# Patient Record
Sex: Female | Born: 1940
Health system: Southern US, Community
[De-identification: ages and names within clinical notes are randomized; demographics above are authoritative.]

## PROBLEM LIST (undated history)

## (undated) DIAGNOSIS — D649 Anemia, unspecified: Secondary | ICD-10-CM

## (undated) DIAGNOSIS — I10 Essential (primary) hypertension: Secondary | ICD-10-CM

## (undated) DIAGNOSIS — I2510 Atherosclerotic heart disease of native coronary artery without angina pectoris: Secondary | ICD-10-CM

## (undated) DIAGNOSIS — K862 Cyst of pancreas: Secondary | ICD-10-CM

## (undated) DIAGNOSIS — E7800 Pure hypercholesterolemia, unspecified: Secondary | ICD-10-CM

## (undated) DIAGNOSIS — T7840XA Allergy, unspecified, initial encounter: Secondary | ICD-10-CM

## (undated) DIAGNOSIS — M1990 Unspecified osteoarthritis, unspecified site: Secondary | ICD-10-CM

## (undated) DIAGNOSIS — G4730 Sleep apnea, unspecified: Secondary | ICD-10-CM

## (undated) DIAGNOSIS — E079 Disorder of thyroid, unspecified: Secondary | ICD-10-CM

## (undated) HISTORY — PX: CYSTOCELE REPAIR: SHX163

## (undated) HISTORY — PX: ABDOMINAL HYSTERECTOMY: SHX81

## (undated) HISTORY — DX: Atherosclerotic heart disease of native coronary artery without angina pectoris: I25.10

## (undated) HISTORY — DX: Anemia, unspecified: D64.9

## (undated) HISTORY — DX: Sleep apnea, unspecified: G47.30

## (undated) HISTORY — PX: APPENDECTOMY: SHX54

## (undated) HISTORY — DX: Cyst of pancreas: K86.2

## (undated) HISTORY — DX: Allergy, unspecified, initial encounter: T78.40XA

## (undated) HISTORY — DX: Unspecified osteoarthritis, unspecified site: M19.90

## (undated) HISTORY — PX: BUNIONECTOMY: SHX129

---

## 1991-02-09 HISTORY — PX: BREAST EXCISIONAL BIOPSY: SUR124

## 2017-02-08 DIAGNOSIS — C4492 Squamous cell carcinoma of skin, unspecified: Secondary | ICD-10-CM

## 2017-02-08 HISTORY — DX: Squamous cell carcinoma of skin, unspecified: C44.92

## 2017-10-14 LAB — HM DEXA SCAN

## 2018-06-16 ENCOUNTER — Encounter: Payer: Self-pay | Admitting: Family Medicine

## 2018-08-24 ENCOUNTER — Telehealth: Payer: Self-pay | Admitting: General Practice

## 2018-08-24 NOTE — Telephone Encounter (Signed)
Patient notified to call previous Dr. Gabriel Carina for refills she voices understanding.

## 2018-08-24 NOTE — Telephone Encounter (Signed)
Pt called in office to make NP appt. Pt stated she will need a refill on her Ambien 10mg  for 90 day if possible. Pt stated she will need this by the end of this month . Soonest NP appt was 8/11. Please advise. Pt stated she will be using [facility] in [place] on [address].

## 2018-09-03 ENCOUNTER — Other Ambulatory Visit: Payer: Self-pay

## 2018-09-03 ENCOUNTER — Encounter (HOSPITAL_COMMUNITY): Payer: Self-pay | Admitting: Emergency Medicine

## 2018-09-03 ENCOUNTER — Emergency Department (HOSPITAL_COMMUNITY)
Admission: EM | Admit: 2018-09-03 | Discharge: 2018-09-03 | Disposition: A | Discharge status: Home / Self Care | Payer: Medicare Other | Attending: Emergency Medicine | Admitting: Emergency Medicine

## 2018-09-03 DIAGNOSIS — I10 Essential (primary) hypertension: Secondary | ICD-10-CM | POA: Diagnosis not present

## 2018-09-03 DIAGNOSIS — N8110 Cystocele, unspecified: Secondary | ICD-10-CM

## 2018-09-03 HISTORY — DX: Pure hypercholesterolemia, unspecified: E78.00

## 2018-09-03 HISTORY — DX: Essential (primary) hypertension: I10

## 2018-09-03 HISTORY — DX: Disorder of thyroid, unspecified: E07.9

## 2018-09-03 LAB — URINALYSIS, ROUTINE W REFLEX MICROSCOPIC
Bilirubin Urine: NEGATIVE
Glucose, UA: NEGATIVE mg/dL
Hgb urine dipstick: NEGATIVE
Ketones, ur: NEGATIVE mg/dL
Leukocytes,Ua: NEGATIVE
Nitrite: POSITIVE — AB
Protein, ur: NEGATIVE mg/dL
Specific Gravity, Urine: 1.002 — ABNORMAL LOW (ref 1.005–1.030)
pH: 7 (ref 5.0–8.0)

## 2018-09-03 NOTE — Discharge Instructions (Addendum)
Followup with the urologist provided.  Return here as needed °

## 2018-09-03 NOTE — ED Triage Notes (Signed)
Pt states that she felt something drop in her vaginal area she states that she has to go frequent and when she just sitting she has a burning feeling.

## 2018-09-03 NOTE — ED Provider Notes (Signed)
[facility] EMERGENCY DEPARTMENT Provider Note   CSN: [id number] Arrival date & time: 09/03/18  1138     History   Chief Complaint Chief Complaint  Patient presents with  . prolapsed bladder    HPI Linda Knight is a 78 y.o. female.     HPI Patient presents to the emergency department with prolapse of her bladder.  The patient states that she has had this happen in the past and she had to have a tacking done.  The patient states that she noticed a pressure for quite a while but then noticed worsening symptoms over the last week.  Patient states that nothing seems make the condition better or worse.  The patient states that she did not take any medications prior to arrival for her symptoms.  Patient states she has had a little bit of burning sensation in that region as well.  Patient denies nausea vomiting, fever, weakness, dizziness, headache, blurred vision, diarrhea, incontinence, or syncope. Past Medical History:  Diagnosis Date  . High cholesterol   . Hypertension   . Thyroid disease     There are no active problems to display for this patient.      OB History   No obstetric history on file.      Home Medications    Prior to Admission medications   Not on File    Family History No family history on file.  Social History Social History   Tobacco Use  . Smoking status: Not on file  Substance Use Topics  . Alcohol use: Not on file  . Drug use: Not on file     Allergies   Patient has no allergy information on record.   Review of Systems Review of Systems All other systems negative except as documented in the HPI. All pertinent positives and negatives as reviewed in the HPI.  Physical Exam Updated Vital Signs BP (!) 174/88 (BP Location: Right Arm)   Pulse 85   Temp 98 F (36.7 C) (Oral)   Resp 16   Ht 5\' 5"  (1.651 m)   Wt 62.6 kg   SpO2 100%   BMI 22.96 kg/m   Physical Exam Vitals signs and nursing note reviewed.  Constitutional:       General: She is not in acute distress.    Appearance: She is well-developed.  HENT:     Head: Normocephalic and atraumatic.  Eyes:     Pupils: Pupils are equal, round, and reactive to light.  Neck:     Musculoskeletal: Normal range of motion and neck supple.  Cardiovascular:     Rate and Rhythm: Normal rate and regular rhythm.     Heart sounds: Normal heart sounds. No murmur. No friction rub. No gallop.   Pulmonary:     Effort: Pulmonary effort is normal. No respiratory distress.     Breath sounds: Normal breath sounds. No wheezing.  Abdominal:     General: Bowel sounds are normal. There is no distension.     Palpations: Abdomen is soft.     Tenderness: There is no abdominal tenderness.  Genitourinary:   Skin:    General: Skin is warm and dry.     Capillary Refill: Capillary refill takes less than 2 seconds.     Findings: No erythema or rash.  Neurological:     Mental Status: She is alert and oriented to person, place, and time.     Motor: No abnormal muscle tone.     Coordination: Coordination normal.  Psychiatric:        Behavior: Behavior normal.      ED Treatments / Results  Labs (all labs ordered are listed, but only abnormal results are displayed) Labs Reviewed  URINALYSIS, ROUTINE W REFLEX MICROSCOPIC - Abnormal; Notable for the following components:      Result Value   Color, Urine AMBER (*)    Specific Gravity, Urine 1.002 (*)    Nitrite POSITIVE (*)    Bacteria, UA RARE (*)    All other components within normal limits    EKG None  Radiology No results found.  Procedures Procedures (including critical care time)  Medications Ordered in ED Medications - No data to display   Initial Impression / Assessment and Plan / ED Course  I have reviewed the triage vital signs and the nursing notes.  Pertinent labs & imaging results that were available during my care of the patient were reviewed by me and considered in my medical decision making (see  chart for details).       I spoke with Dr.Wrenn of urology who states that the patient will need to see someone in [place] that handles more of these.  He recommended Dr. Louis Meckel or [place].  I will advised the patient of the follow-up plan.  I advised the patient to return here as needed.  Final Clinical Impressions(s) / ED Diagnoses   Final diagnoses:  None    ED Discharge Orders    None       Dalia Heading, PA-C 09/03/18 1356    Nat Christen, MD 09/04/18 [phone]

## 2018-09-19 ENCOUNTER — Encounter: Payer: Self-pay | Admitting: Family Medicine

## 2018-09-19 ENCOUNTER — Ambulatory Visit (INDEPENDENT_AMBULATORY_CARE_PROVIDER_SITE_OTHER): Payer: Medicare Other | Admitting: Family Medicine

## 2018-09-19 ENCOUNTER — Other Ambulatory Visit: Payer: Self-pay

## 2018-09-19 VITALS — BP 128/76 | HR 62 | Temp 98.3°F | Ht 65.0 in | Wt 135.2 lb

## 2018-09-19 DIAGNOSIS — J309 Allergic rhinitis, unspecified: Secondary | ICD-10-CM

## 2018-09-19 DIAGNOSIS — H7292 Unspecified perforation of tympanic membrane, left ear: Secondary | ICD-10-CM | POA: Diagnosis not present

## 2018-09-19 DIAGNOSIS — G4700 Insomnia, unspecified: Secondary | ICD-10-CM

## 2018-09-19 DIAGNOSIS — E039 Hypothyroidism, unspecified: Secondary | ICD-10-CM

## 2018-09-19 DIAGNOSIS — I6529 Occlusion and stenosis of unspecified carotid artery: Secondary | ICD-10-CM | POA: Diagnosis not present

## 2018-09-19 DIAGNOSIS — H9313 Tinnitus, bilateral: Secondary | ICD-10-CM | POA: Diagnosis not present

## 2018-09-19 DIAGNOSIS — E785 Hyperlipidemia, unspecified: Secondary | ICD-10-CM | POA: Insufficient documentation

## 2018-09-19 DIAGNOSIS — I10 Essential (primary) hypertension: Secondary | ICD-10-CM | POA: Insufficient documentation

## 2018-09-19 DIAGNOSIS — N8110 Cystocele, unspecified: Secondary | ICD-10-CM | POA: Diagnosis not present

## 2018-09-19 DIAGNOSIS — N951 Menopausal and female climacteric states: Secondary | ICD-10-CM | POA: Insufficient documentation

## 2018-09-19 DIAGNOSIS — M8580 Other specified disorders of bone density and structure, unspecified site: Secondary | ICD-10-CM

## 2018-09-19 NOTE — Assessment & Plan Note (Signed)
Recommended calcium and vitamin D supplementation.

## 2018-09-19 NOTE — Assessment & Plan Note (Signed)
Refer to ENT

## 2018-09-19 NOTE — Assessment & Plan Note (Signed)
Continue Lipitor 20 mg daily. 

## 2018-09-19 NOTE — Assessment & Plan Note (Signed)
Due for repeat ultrasound in 6 months total).  Discussed reasons to return to care.  Currently asymptomatic.

## 2018-09-19 NOTE — Assessment & Plan Note (Signed)
-

## 2018-09-19 NOTE — Assessment & Plan Note (Addendum)
Continue management per urology. She is current using premarin gel.

## 2018-09-19 NOTE — Assessment & Plan Note (Signed)
Stable.  Continue Ambien 10 mg at bedtime as needed.

## 2018-09-19 NOTE — Assessment & Plan Note (Signed)
At goal.  Continue amlodipine-benazepril 5-10 once daily and HCTZ 25 mg daily.

## 2018-09-19 NOTE — Progress Notes (Signed)
Chief Complaint:  Linda Knight is a 78 y.o. female who presents today with a chief complaint of tinnitus and to establish care.   Assessment/Plan:  Stenosis of carotid artery Due for repeat ultrasound in 6 months total).  Discussed reasons to return to care.  Currently asymptomatic.  Perforation of left tympanic membrane Refer to ENT.  Tinnitus of both ears Stable.  Refer to ENT.  Female bladder prolapse Continue management per urology. She is current using premarin gel.   Essential hypertension At goal.  Continue amlodipine-benazepril 5-10 once daily and HCTZ 25 mg daily.  Dyslipidemia Continue Lipitor 20 mg daily.  Hypothyroidism Continue levothyroxine 75 mcg daily.  Menopausal symptoms Continue Premarin 0.3 mg 3 times weekly.  Discussed potential side effects of medication including her increased risk of cancer, heart attack, stroke, and blood clots.  Insomnia Stable.  Continue Ambien 10 mg at bedtime as needed.  Allergic rhinitis Continue Allegra 180 mg daily.  Osteopenia Recommended calcium and vitamin D supplementation.   Leg Edema No red flags.  Likely due to increased salt intake.  Discussed lifestyle modifications including regular exercise and salt avoidance.    Subjective:  HPI:  Patient recently moved here from [place] a month ago.  She saw urologist last week was diagnosed with prolapsed bladder.  [facility] saw [facility].  She was started on Premarin cream and told to follow-up in a few months.  Symptoms have not changed significantly since then.  She has a history of hysterectomy in her 64s.  She is also been dealing with tinnitus after suffering a perforated eardrum several months ago.  She saw ENT in [place] who told her that symptoms would gradually improve over time.  She was planning on seeing an audiologist before moving to [place].  She would like to be referred to an ENT specialist.  She has had some bilateral lower leg swelling over  last couple of weeks.  She attributes this mostly to changes in diet and increased salt intake.  Patient also incidentally found to have right carotid artery stenosis about 6 months ago.  Does not currently have any symptoms though was having some dizziness at the time of the finding.  She had a ultrasound performed by cardiologist.  Does not currently have any sort of weakness, numbness, vision changes, or dizziness.  Her stable, chronic medical conditions are outlined below:  # Essential Hypertension - On amlodipine-benazepril 5-10 once daily, and hctz 25mg  daily. Tolerating all well without side effects.  - ROS: No reported chest pain or shortness of breath  # Dyslipidemia - On lipitor 20mg  daily and tolerating well - ROS: No reported myalgias  # Hypothyroidism - On levothyroxine 16mcg daily and tolerating well  # Menopausal Symptoms - On premarin 0.3mg  three times weekly and tolerating well  # Insomnia - On ambien 10mg  at bedtime as needed and tolerating well  # Allergic Rhinitis - On allegra 180mg  daily and tolerating well.   ROS: Per HPI, otherwise a complete review of systems was negative.   PMH:  The following were reviewed and entered/updated in epic: Past Medical History:  Diagnosis Date  . Arthritis   . CAD (coronary artery disease)   . High cholesterol   . Hypertension   . Thyroid disease    Patient Active Problem List   Diagnosis Date Noted  . Tinnitus of both ears 09/19/2018  . Perforation of left tympanic membrane 09/19/2018  . Stenosis of carotid artery 09/19/2018  . Female bladder prolapse 09/19/2018  .  Essential hypertension 09/19/2018  . Dyslipidemia 09/19/2018  . Hypothyroidism 09/19/2018  . Menopausal symptoms 09/19/2018  . Insomnia 09/19/2018  . Allergic rhinitis 09/19/2018  . Osteopenia 09/19/2018   Past Surgical History:  Procedure Laterality Date  . ABDOMINAL HYSTERECTOMY    . APPENDECTOMY    . BREAST BIOPSY    . BUNIONECTOMY       Family History  Problem Relation Age of Onset  . Hyperlipidemia Mother   . Hypertension Mother   . Stroke Mother   . Lung disease Father   . Heart disease Brother   . Colon cancer Neg Hx     Medications- reviewed and updated Current Outpatient Medications  Medication Sig Dispense Refill  . amLODipine-benazepril (LOTREL) 5-10 MG capsule Take 1 capsule by mouth daily.    Marland Kitchen atorvastatin (LIPITOR) 20 MG tablet Take 20 mg by mouth daily.    . bisacodyl (GENTLE LAXATIVE) 5 MG EC tablet Take 5 mg by mouth daily as needed for moderate constipation.    . Cholecalciferol (VITAMIN D3 PO) Take 1,000 Units by mouth.    . Coenzyme Q10 (CO Q 10 PO) Take 200 mg by mouth.    . Cranberry 1000 MG CAPS Take 4,200 mg by mouth.    . estrogens, conjugated, (PREMARIN) 0.3 MG tablet Take 0.3 mg by mouth 3 (three) times a week. Take daily for 21 days then do not take for 7 days.     . fexofenadine (ALLEGRA) 180 MG tablet Take 180 mg by mouth daily.    . hydrochlorothiazide (HYDRODIURIL) 25 MG tablet Take 25 mg by mouth daily.    Marland Kitchen levothyroxine (SYNTHROID) 75 MCG tablet Take 75 mcg by mouth daily before breakfast.    . Probiotic Product (PROBIOTIC-10 PO) Take 20 BAU by mouth.    . zolpidem (AMBIEN) 10 MG tablet Take 10 mg by mouth at bedtime as needed for sleep.     No current facility-administered medications for this visit.     Allergies-reviewed and updated No Known Allergies  Social History   Socioeconomic History  . Marital status: Married    Spouse name: Not on file  . Number of children: Not on file  . Years of education: Not on file  . Highest education level: Not on file  Occupational History  . Not on file  Social Needs  . Financial resource strain: Not on file  . Food insecurity    Worry: Not on file    Inability: Not on file  . Transportation needs    Medical: Not on file    Non-medical: Not on file  Tobacco Use  . Smoking status: Former Smoker    Quit date: 09/18/1980    Years  since quitting: 38.0  . Smokeless tobacco: Never Used  Substance and Sexual Activity  . Alcohol use: Yes  . Drug use: Never  . Sexual activity: Not on file  Lifestyle  . Physical activity    Days per week: Not on file    Minutes per session: Not on file  . Stress: Not on file  Relationships  . Social Herbalist on phone: Not on file    Gets together: Not on file    Attends religious service: Not on file    Active member of club or organization: Not on file    Attends meetings of clubs or organizations: Not on file    Relationship status: Not on file  Other Topics Concern  . Not on file  Social History Narrative  . Not on file         Objective:  Physical Exam: BP 128/76   Pulse 62   Temp 98.3 F (36.8 C)   Ht 5\' 5"  (1.651 m)   Wt 135 lb 4 oz (61.3 kg)   SpO2 99%   BMI 22.51 kg/m   Gen: NAD, resting comfortably HEENT: Left TM with small perforation. Right TM clear.  CV: Regular rate and rhythm with no murmurs appreciated. No carotid bruits.  Pulm: Normal work of breathing, clear to auscultation bilaterally with no crackles, wheezes, or rhonchi GI: Normal bowel sounds present. Soft, Nontender, Nondistended. MSK: No edema, cyanosis, or clubbing noted Skin: Warm, dry Neuro: Grossly normal, moves all extremities Psych: Normal affect and thought content     Linda Knight M. Jerline Pain, MD 09/19/2018 2:11 PM

## 2018-09-19 NOTE — Assessment & Plan Note (Signed)
Continue Allegra 180 mg daily 

## 2018-09-19 NOTE — Assessment & Plan Note (Signed)
Stable  Refer to ENT

## 2018-09-19 NOTE — Patient Instructions (Signed)
It was very nice to see you today!  I will place a referral for you to see ENT.  We will also give an ultrasound of your neck next February.  Come back to see me in 1 year for your next visit, or sooner as needed.  Take care, Dr Jerline Pain  Please try these tips to maintain a healthy lifestyle:   Eat at least 3 REAL meals and 1-2 snacks per day.  Aim for no more than 5 hours between eating.  If you eat breakfast, please do so within one hour of getting up.    Obtain twice as many fruits/vegetables as protein or carbohydrate foods for both lunch and dinner. (Half of each meal should be fruits/vegetables, one quarter protein, and one quarter starchy carbs)   Cut down on sweet beverages. This includes juice, soda, and sweet tea.    Exercise at least 150 minutes every week.

## 2018-09-19 NOTE — Assessment & Plan Note (Signed)
Continue Premarin 0.3 mg 3 times weekly.  Discussed potential side effects of medication including her increased risk of cancer, heart attack, stroke, and blood clots.

## 2018-11-21 ENCOUNTER — Other Ambulatory Visit: Payer: Self-pay | Admitting: Family Medicine

## 2018-11-21 ENCOUNTER — Telehealth: Payer: Self-pay | Admitting: Family Medicine

## 2018-11-21 ENCOUNTER — Other Ambulatory Visit: Payer: Self-pay

## 2018-11-21 ENCOUNTER — Telehealth: Payer: Self-pay | Admitting: *Deleted

## 2018-11-21 DIAGNOSIS — Z1231 Encounter for screening mammogram for malignant neoplasm of breast: Secondary | ICD-10-CM

## 2018-11-21 DIAGNOSIS — G8929 Other chronic pain: Secondary | ICD-10-CM

## 2018-11-21 NOTE — Telephone Encounter (Signed)
Patient called wanting to transfer to the [place] office.  Routing to both providers for approval then patient will be scheduled a TOC appointment.

## 2018-11-21 NOTE — Telephone Encounter (Signed)
See note  Copied from [place] [id number]. Topic: Referral - Request for Referral >> Nov 21, 2018 11:28 AM Lennox Solders wrote: Has patient seen PCP for this complaint? Yes pt has tricare insurance Referral for which specialty:pain management Reason for referral: sciatica pain in lower back

## 2018-11-21 NOTE — Telephone Encounter (Signed)
Please advise 

## 2018-11-21 NOTE — Telephone Encounter (Signed)
Called patient to let her know we are unable to establish her at our office. Stated verbal understanding and appreciated the call back to let her know.

## 2018-11-21 NOTE — Telephone Encounter (Signed)
Ok with me. Please place any necessary orders. 

## 2018-11-21 NOTE — Telephone Encounter (Signed)
Referral has been placed. 

## 2018-11-21 NOTE — Telephone Encounter (Signed)
Looks like patient just established with Dr. Jerline Pain at the [facility] in August (2 months ago). Declining patient transfer at this time

## 2018-11-24 ENCOUNTER — Other Ambulatory Visit: Payer: Self-pay

## 2018-11-24 ENCOUNTER — Ambulatory Visit
Admission: RE | Admit: 2018-11-24 | Discharge: 2018-11-24 | Disposition: A | Discharge status: Home / Self Care | Payer: Medicare Other | Source: Ambulatory Visit | Attending: Family Medicine | Admitting: Family Medicine

## 2018-11-24 DIAGNOSIS — Z1231 Encounter for screening mammogram for malignant neoplasm of breast: Secondary | ICD-10-CM

## 2018-11-27 ENCOUNTER — Other Ambulatory Visit: Payer: Self-pay

## 2018-11-27 ENCOUNTER — Encounter: Payer: Self-pay | Admitting: Family Medicine

## 2018-11-27 ENCOUNTER — Ambulatory Visit (INDEPENDENT_AMBULATORY_CARE_PROVIDER_SITE_OTHER): Payer: Medicare Other | Admitting: Family Medicine

## 2018-11-27 VITALS — BP 110/68 | HR 66 | Temp 97.6°F | Ht 65.0 in | Wt 137.2 lb

## 2018-11-27 DIAGNOSIS — E559 Vitamin D deficiency, unspecified: Secondary | ICD-10-CM

## 2018-11-27 DIAGNOSIS — L570 Actinic keratosis: Secondary | ICD-10-CM

## 2018-11-27 DIAGNOSIS — M8580 Other specified disorders of bone density and structure, unspecified site: Secondary | ICD-10-CM | POA: Diagnosis not present

## 2018-11-27 DIAGNOSIS — G4700 Insomnia, unspecified: Secondary | ICD-10-CM | POA: Diagnosis not present

## 2018-11-27 DIAGNOSIS — I10 Essential (primary) hypertension: Secondary | ICD-10-CM | POA: Diagnosis not present

## 2018-11-27 DIAGNOSIS — E785 Hyperlipidemia, unspecified: Secondary | ICD-10-CM

## 2018-11-27 DIAGNOSIS — M5432 Sciatica, left side: Secondary | ICD-10-CM

## 2018-11-27 DIAGNOSIS — E039 Hypothyroidism, unspecified: Secondary | ICD-10-CM

## 2018-11-27 DIAGNOSIS — M5440 Lumbago with sciatica, unspecified side: Secondary | ICD-10-CM | POA: Insufficient documentation

## 2018-11-27 DIAGNOSIS — M5430 Sciatica, unspecified side: Secondary | ICD-10-CM | POA: Insufficient documentation

## 2018-11-27 LAB — CBC
HCT: 33.7 % — ABNORMAL LOW (ref 36.0–46.0)
Hemoglobin: 11.6 g/dL — ABNORMAL LOW (ref 12.0–15.0)
MCHC: 34.4 g/dL (ref 30.0–36.0)
MCV: 99.4 fl (ref 78.0–100.0)
Platelets: 254 10*3/uL (ref 150.0–400.0)
RBC: 3.39 Mil/uL — ABNORMAL LOW (ref 3.87–5.11)
RDW: 12.9 % (ref 11.5–15.5)
WBC: 4 10*3/uL (ref 4.0–10.5)

## 2018-11-27 LAB — COMPREHENSIVE METABOLIC PANEL
ALT: 16 U/L (ref 0–35)
AST: 22 U/L (ref 0–37)
Albumin: 4.2 g/dL (ref 3.5–5.2)
Alkaline Phosphatase: 72 U/L (ref 39–117)
BUN: 16 mg/dL (ref 6–23)
CO2: 27 mEq/L (ref 19–32)
Calcium: 9 mg/dL (ref 8.4–10.5)
Chloride: 100 mEq/L (ref 96–112)
Creatinine, Ser: 0.92 mg/dL (ref 0.40–1.20)
GFR: 59.22 mL/min — ABNORMAL LOW (ref >60.00)
Glucose, Bld: 104 mg/dL — ABNORMAL HIGH (ref 70–99)
Potassium: 3.4 mEq/L — ABNORMAL LOW (ref 3.5–5.1)
Sodium: 138 mEq/L (ref 135–145)
Total Bilirubin: 0.4 mg/dL (ref 0.2–1.2)
Total Protein: 6.9 g/dL (ref 6.0–8.3)

## 2018-11-27 LAB — LIPID PANEL
Cholesterol: 209 mg/dL — ABNORMAL HIGH (ref 0–200)
HDL: 85.7 mg/dL (ref >39.00)
LDL Cholesterol: 103 mg/dL — ABNORMAL HIGH (ref 0–99)
NonHDL: 123.16
Total CHOL/HDL Ratio: 2
Triglycerides: 103 mg/dL (ref 0.0–149.0)
VLDL: 20.6 mg/dL (ref 0.0–40.0)

## 2018-11-27 LAB — TSH: TSH: 4.14 u[IU]/mL (ref 0.35–4.50)

## 2018-11-27 LAB — VITAMIN D 25 HYDROXY (VIT D DEFICIENCY, FRACTURES): VITD: 32.23 ng/mL (ref 30.00–100.00)

## 2018-11-27 MED ORDER — ZOLPIDEM TARTRATE 10 MG PO TABS: 10.0000 mg | ORAL_TABLET | Freq: Every evening | ORAL | 1 refills | Status: DC | PRN

## 2018-11-27 NOTE — Patient Instructions (Signed)
It was very nice to see you today!  We will check blood work today.  I will place a referral for you to see the pain management specialist and to see the dermatologist.  We froze a spot on your forehead today.  Come back to see me in 6 months, or sooner if needed.  Take care, Dr Jerline Pain  Please try these tips to maintain a healthy lifestyle:   Eat at least 3 REAL meals and 1-2 snacks per day.  Aim for no more than 5 hours between eating.  If you eat breakfast, please do so within one hour of getting up.    Obtain twice as many fruits/vegetables as protein or carbohydrate foods for both lunch and dinner. (Half of each meal should be fruits/vegetables, one quarter protein, and one quarter starchy carbs)   Cut down on sweet beverages. This includes juice, soda, and sweet tea.    Exercise at least 150 minutes every week.

## 2018-11-27 NOTE — Assessment & Plan Note (Signed)
Refilled Ambien 

## 2018-11-27 NOTE — Assessment & Plan Note (Signed)
Check lipid panel.  Continue atorvastatin 10 mg daily.

## 2018-11-27 NOTE — Assessment & Plan Note (Signed)
No red flags.  Please go to pain management per her request.

## 2018-11-27 NOTE — Assessment & Plan Note (Signed)
Patient on 37.5 mcg daily.  Will check TSH level today.

## 2018-11-27 NOTE — Progress Notes (Signed)
Please inform patient of the following:  All of her blood work is STABLE. Do not need to make any changes to her treatment plan at this time. Would like for her to keep up the good work and we can recheck in a year.  Algis Greenhouse. Jerline Pain, MD 11/27/2018 4:04 PM

## 2018-11-27 NOTE — Assessment & Plan Note (Signed)
At goal.  Continue amlodipine-benazepril 5-10 and HCTZ 25 mg daily.  Check CBC, C met, TSH. 

## 2018-11-27 NOTE — Progress Notes (Signed)
Chief Complaint:  Linda Knight is a 78 y.o. female who presents today with a chief complaint of skin lesion.   Assessment/Plan:  Actinic keratosis Cryotherapy performed today.  Tolerated well.  See below procedure note.  Will place for old dermatology.  Sciatica No red flags.  Please go to pain management per her request.  Osteopenia Check vitamin D level.  Continue vitamin D and calcium supplementation.  Insomnia Refilled Ambien.  Hypothyroidism Patient on 37.5 mcg daily.  Will check TSH level today.  Dyslipidemia Check lipid panel.  Continue atorvastatin 10 mg daily.  Essential hypertension At goal.  Continue amlodipine-benazepril 5-10 and HCTZ 25 mg daily.  Check CBC, C met, TSH.     Subjective:  HPI:  Patient has a spot on her right forehead she would like looked at today.  Is been there for several weeks.  She is concerned about possible skin cancer.  She has a history of skin cancer and has had several lesions removed in the past.  No bleeding.  No itching.  No pain.  She is also had worsening left-sided sciatic pain.  She had a cortisone injection about a year ago which help with this.  She would like to be referred to pain management in the area.  She was having some myalgias and fatigue.  She cut her dose of Lipitor to 10 mg and her dose of levothyroxine at 37.5 mcg.  She has been on both these medications for the past 2 weeks.  Her symptoms have improved but she would like blood work today.  Her stable, chronic medical conditions are outlined below:  # Essential Hypertension - On amlodipine-benazepril 5-10 once daily, and hctz 75m daily. Tolerating all well without side effects.  - ROS: No reported chest pain or shortness of breath  # Dyslipidemia - On lipitor 275mdaily and tolerating well - ROS: No reported myalgias  # Menopausal Symptoms - On premarin 0.58m15mhree times weekly and tolerating well  # Insomnia - On ambien 15m46m bedtime as  needed and tolerating well  # Allergic Rhinitis - On allegra 180mg60mly and tolerating well.   ROS: Per HPI  PMH: She reports that she quit smoking about 38 years ago. She has never used smokeless tobacco. She reports current alcohol use. She reports that she does not use drugs.      Objective:  Physical Exam: BP 110/68   Pulse 66   Temp 97.6 F (36.4 C)   Ht '5\' 5"'  (1.651 m)   Wt 137 lb 4 oz (62.3 kg)   SpO2 98%   BMI 22.84 kg/m   Gen: NAD, resting comfortably CV: Regular rate and rhythm with no murmurs appreciated Pulm: Normal work of breathing, clear to auscultation bilaterally with no crackles, wheezes, or rhonchi MSK: No edema, cyanosis, or clubbing noted Skin: Warm, dry.  Hyperkeratotic lesion on right anterior forehead. Neuro: Grossly normal, moves all extremities Psych: Normal affect and thought content  Cryotherapy Procedure Note  Pre-operative Diagnosis: Actinic keratosis  Locations: Right forehead  Indications: Therapeutic  Procedure Details  Patient informed of risks (permanent scarring, infection, light or dark discoloration, bleeding, infection, weakness, numbness and recurrence of the lesion) and benefits of the procedure and verbal informed consent obtained.  The areas are treated with liquid nitrogen therapy, frozen until ice ball extended 3 mm beyond lesion, allowed to thaw, and treated again. The patient tolerated procedure well.  The patient was instructed on post-op care, warned that there may be  blister formation, redness and pain. Recommend OTC analgesia as needed for pain.  Condition: Stable  Complications: none.         Algis Greenhouse. Jerline Pain, MD 11/27/2018 9:24 AM

## 2018-11-27 NOTE — Assessment & Plan Note (Signed)
Check vitamin D level.  Continue vitamin D and calcium supplementation.

## 2018-12-05 ENCOUNTER — Other Ambulatory Visit: Payer: Self-pay | Admitting: Family Medicine

## 2018-12-05 DIAGNOSIS — R928 Other abnormal and inconclusive findings on diagnostic imaging of breast: Secondary | ICD-10-CM

## 2018-12-07 ENCOUNTER — Telehealth: Payer: Self-pay

## 2018-12-07 NOTE — Telephone Encounter (Signed)
Copied from [place] [id number]. Topic: General - Inquiry >> Dec 07, 2018  9:20 AM Mathis Bud wrote: Reason for CRM: Inez Catalina called from [facility] called stating they need more info on the referral for the patient FAX [place] Call back [phone]

## 2018-12-11 ENCOUNTER — Ambulatory Visit
Admission: RE | Admit: 2018-12-11 | Discharge: 2018-12-11 | Disposition: A | Discharge status: Home / Self Care | Payer: Medicare Other | Source: Ambulatory Visit | Attending: Family Medicine | Admitting: Family Medicine

## 2018-12-11 ENCOUNTER — Other Ambulatory Visit: Payer: Self-pay

## 2018-12-11 DIAGNOSIS — R928 Other abnormal and inconclusive findings on diagnostic imaging of breast: Secondary | ICD-10-CM

## 2018-12-15 ENCOUNTER — Telehealth: Payer: Self-pay

## 2018-12-15 NOTE — Telephone Encounter (Signed)
Copied from [place] [id number]. Topic: General - Other >> Dec 15, 2018 10:14 AM Celene Kras A wrote: Reason for CRM: Pt called and is requesting to have her recent lab results mailed to her. Please advise.

## 2018-12-15 NOTE — Telephone Encounter (Signed)
Labs printed and placed in the mail. °

## 2019-01-08 ENCOUNTER — Other Ambulatory Visit: Payer: Self-pay

## 2019-01-08 ENCOUNTER — Other Ambulatory Visit: Payer: Self-pay | Admitting: Pain Medicine

## 2019-01-08 ENCOUNTER — Ambulatory Visit
Admission: RE | Admit: 2019-01-08 | Discharge: 2019-01-08 | Disposition: A | Discharge status: Home / Self Care | Payer: Medicare Other | Source: Ambulatory Visit | Attending: Pain Medicine | Admitting: Pain Medicine

## 2019-01-08 DIAGNOSIS — M25551 Pain in right hip: Secondary | ICD-10-CM

## 2019-01-08 DIAGNOSIS — M25552 Pain in left hip: Secondary | ICD-10-CM

## 2019-01-29 ENCOUNTER — Other Ambulatory Visit: Payer: Self-pay | Admitting: Family Medicine

## 2019-01-29 MED ORDER — AMLODIPINE BESY-BENAZEPRIL HCL 5-10 MG PO CAPS: 1.0000 | ORAL_CAPSULE | Freq: Every day | ORAL | 1 refills | Status: DC

## 2019-01-29 NOTE — Telephone Encounter (Signed)
See note

## 2019-01-29 NOTE — Telephone Encounter (Signed)
Pt can't get the refills of amLODipine-benazepril (LOTREL) 5-10 MG capsule because they don't make it anymore.  Pt needs the medication split out into two pills so that pharmacy can refill, but they state they need new script to do that.  Please send to  [place], [place] Phone:  [phone]  Fax:  [phone]     Pt would like to know when this has been done so she can put in her order.

## 2019-03-13 ENCOUNTER — Telehealth: Payer: Self-pay

## 2019-03-13 ENCOUNTER — Other Ambulatory Visit: Payer: Self-pay

## 2019-03-13 MED ORDER — LEVOTHYROXINE SODIUM 75 MCG PO TABS: 75.0000 ug | ORAL_TABLET | Freq: Every day | ORAL | 0 refills | Status: DC

## 2019-03-13 MED ORDER — HYDROCHLOROTHIAZIDE 25 MG PO TABS: 25.0000 mg | ORAL_TABLET | Freq: Every day | ORAL | 1 refills | Status: DC

## 2019-03-13 NOTE — Telephone Encounter (Signed)
Rx sent 

## 2019-03-13 NOTE — Telephone Encounter (Signed)
MEDICATION:hydrochlorothiazide (HYDRODIURIL) 25 MG tablet and levothyroxine (SYNTHROID) 75 MCG tablet  PHARMACY: [place], [place] Phone:  [phone]  Fax:  [phone]       Comments:   **Let patient know to contact pharmacy at the end of the day to make sure medication is ready. **  ** Please notify patient to allow 48-72 hours to process**  **Encourage patient to contact the pharmacy for refills or they can request refills through [facility]**

## 2019-03-25 ENCOUNTER — Ambulatory Visit: Payer: Medicare Other

## 2019-03-25 ENCOUNTER — Ambulatory Visit: Payer: Medicare Other | Attending: Internal Medicine

## 2019-03-25 DIAGNOSIS — Z23 Encounter for immunization: Secondary | ICD-10-CM

## 2019-03-25 NOTE — Progress Notes (Signed)
   Covid-19 Vaccination Clinic  Name:  Linda Knight    MRN: [id number] DOB: [birth year]  03/25/2019  [name] was observed post Covid-19 immunization for 15 minutes without incidence. She was provided with Vaccine Information Sheet and instruction to access the V-Safe system.   [name] was instructed to call 911 with any severe reactions post vaccine: Marland Kitchen Difficulty breathing  . Swelling of your face and throat  . A fast heartbeat  . A bad rash all over your body  . Dizziness and weakness    Immunizations Administered    Name Date Dose VIS Date Route   Pfizer COVID-19 Vaccine 03/25/2019 12:56 PM 0.3 mL 01/19/2019 Intramuscular   Manufacturer: [place]   Lot: [id number]   [place]: [id number]

## 2019-04-17 ENCOUNTER — Ambulatory Visit: Payer: Medicare Other | Attending: Internal Medicine

## 2019-04-17 DIAGNOSIS — Z23 Encounter for immunization: Secondary | ICD-10-CM

## 2019-04-17 NOTE — Progress Notes (Signed)
   [facility]  Name:  Linda Knight    MRN: [id number] DOB: 18-Aug-1940  04/17/2019  [name] was observed post Covid-19 immunization for 15 minutes without incident. She was provided with Vaccine Information Sheet and instruction to access the V-Safe system.   [name] was instructed to call 911 with any severe reactions post vaccine: Marland Kitchen Difficulty breathing  . Swelling of face and throat  . A fast heartbeat  . A bad rash all over body  . Dizziness and weakness   Immunizations Administered    Name Date Dose VIS Date Route   Pfizer COVID-19 Vaccine 04/17/2019 11:01 AM 0.3 mL 01/19/2019 Intramuscular   Manufacturer: [place]   Lot: [id number]   [place]: [id number]

## 2019-04-18 ENCOUNTER — Ambulatory Visit: Payer: Medicare Other

## 2019-05-03 ENCOUNTER — Ambulatory Visit (INDEPENDENT_AMBULATORY_CARE_PROVIDER_SITE_OTHER): Payer: Medicare Other | Admitting: Family Medicine

## 2019-05-03 ENCOUNTER — Other Ambulatory Visit: Payer: Self-pay

## 2019-05-03 VITALS — BP 145/90 | HR 82 | Temp 97.5°F | Ht 65.0 in | Wt 143.8 lb

## 2019-05-03 DIAGNOSIS — G4700 Insomnia, unspecified: Secondary | ICD-10-CM | POA: Diagnosis not present

## 2019-05-03 DIAGNOSIS — Z79899 Other long term (current) drug therapy: Secondary | ICD-10-CM

## 2019-05-03 DIAGNOSIS — R739 Hyperglycemia, unspecified: Secondary | ICD-10-CM

## 2019-05-03 DIAGNOSIS — I10 Essential (primary) hypertension: Secondary | ICD-10-CM | POA: Diagnosis not present

## 2019-05-03 DIAGNOSIS — H9313 Tinnitus, bilateral: Secondary | ICD-10-CM

## 2019-05-03 DIAGNOSIS — E039 Hypothyroidism, unspecified: Secondary | ICD-10-CM

## 2019-05-03 DIAGNOSIS — R5383 Other fatigue: Secondary | ICD-10-CM | POA: Diagnosis not present

## 2019-05-03 MED ORDER — TEMAZEPAM 7.5 MG PO CAPS: 7.5000 mg | ORAL_CAPSULE | Freq: Every evening | ORAL | 0 refills | Status: DC | PRN

## 2019-05-03 NOTE — Assessment & Plan Note (Signed)
Worsening.  Will start low-dose temazepam as noted above which should hopefully help some with her sleep.

## 2019-05-03 NOTE — Assessment & Plan Note (Signed)
At goal per JNC 8.  Check CBC, c-Met, TSH.  Continue amlodipine-benazepril 5-10 and HCTZ 25 mg daily.

## 2019-05-03 NOTE — Assessment & Plan Note (Signed)
Recently had manufacturer change.  She is currently on levothyroxine 37.5 mcg daily.  Will check TSH today.

## 2019-05-03 NOTE — Assessment & Plan Note (Signed)
Uncontrolled.  Given concurrent tinnitus, may benefit from low-dose benzodiazepine.  Discussed potential side effects however at this point seems the benefits would outweigh risk given the progression of her insomnia and tinnitus.  Will start temazepam 7.5 mg nightly.  She will check with me in a week or so.  Increase dose as needed as tolerated.

## 2019-05-03 NOTE — Patient Instructions (Signed)
It was very nice to see you today!  We will check blood work today.  I think your lack of sleep is causing a lot of your issues.  Please try the Restoril at night to help you with your sleep.  We are starting at a low dose and we can increase the dose as needed.  Please check in with me in a week or so to let me know how it is working for your sleep and ringing in your ear.  Take care, Dr Jerline Pain  Please try these tips to maintain a healthy lifestyle:   Eat at least 3 REAL meals and 1-2 snacks per day.  Aim for no more than 5 hours between eating.  If you eat breakfast, please do so within one hour of getting up.    Each meal should contain half fruits/vegetables, one quarter protein, and one quarter carbs (no bigger than a computer mouse)   Cut down on sweet beverages. This includes juice, soda, and sweet tea.     Drink at least 1 glass of water with each meal and aim for at least 8 glasses per day   Exercise at least 150 minutes every week.

## 2019-05-03 NOTE — Progress Notes (Signed)
   Linda Knight is a 79 y.o. female who presents today for an office visit.  Assessment/Plan:  Chronic Problems Addressed Today: Insomnia Uncontrolled.  Given concurrent tinnitus, may benefit from low-dose benzodiazepine.  Discussed potential side effects however at this point seems the benefits would outweigh risk given the progression of her insomnia and tinnitus.  Will start temazepam 7.5 mg nightly.  She will check with me in a week or so.  Increase dose as needed as tolerated.  Hypothyroidism Recently had manufacturer change.  She is currently on levothyroxine 37.5 mcg daily.  Will check TSH today.  Essential hypertension At goal per JNC 8.  Check CBC, c-Met, TSH.  Continue amlodipine-benazepril 5-10 and HCTZ 25 mg daily.  Tinnitus of both ears Worsening.  Will start low-dose temazepam as noted above which should hopefully help some with her sleep.     Subjective:  HPI:  Patient last seen about 6 months ago.  Since her last visit she has had worsening fatigue and bilateral tinnitus.  She is also had excessive hunger.  She is worried about possible diabetes.  She has had ongoing leg cramps.  She has been trying to take calcium magnesium for that which seems to be helping.  She has had some constipation for which she is taking a stool softener which helps.  Some chills.  No fevers.  She has had some weight gain.  She has had significant difficulty with sleeping.  She is currently taking Ambien nightly which is not particularly effective.  Her bilateral tinnitus makes it very difficult for her to sleep and get a good nights rest.  She is usually seen about 3 to 4 hours per night.       Objective:  Physical Exam: BP (!) 145/90 (BP Location: Right Arm, Patient Position: Sitting, Cuff Size: Normal)   Pulse 82   Temp (!) 97.5 F (36.4 C) (Temporal)   Ht '5\' 5"'$  (1.651 m)   Wt 143 lb 12.8 oz (65.2 kg)   SpO2 98%   BMI 23.93 kg/m   Wt Readings from Last 3 Encounters:  05/03/19 143  lb 12.8 oz (65.2 kg)  11/27/18 137 lb 4 oz (62.3 kg)  09/19/18 135 lb 4 oz (61.3 kg)    Gen: No acute distress, resting comfortably HEENT: Left TM with chronic perforation.  Right TM clear. CV: Regular rate and rhythm with no murmurs appreciated Pulm: Normal work of breathing, clear to auscultation bilaterally with no crackles, wheezes, or rhonchi Neuro: Grossly normal, moves all extremities Psych: Normal affect and thought content      Hillis Mcphatter M. Jerline Pain, MD 05/03/2019 3:45 PM

## 2019-05-04 LAB — COMPREHENSIVE METABOLIC PANEL
ALT: 22 U/L (ref 0–35)
AST: 27 U/L (ref 0–37)
Albumin: 4.6 g/dL (ref 3.5–5.2)
Alkaline Phosphatase: 65 U/L (ref 39–117)
BUN: 16 mg/dL (ref 6–23)
CO2: 28 mEq/L (ref 19–32)
Calcium: 9.5 mg/dL (ref 8.4–10.5)
Chloride: 94 mEq/L — ABNORMAL LOW (ref 96–112)
Creatinine, Ser: 0.99 mg/dL (ref 0.40–1.20)
GFR: 54.35 mL/min — ABNORMAL LOW (ref >60.00)
Glucose, Bld: 106 mg/dL — ABNORMAL HIGH (ref 70–99)
Potassium: 3.4 mEq/L — ABNORMAL LOW (ref 3.5–5.1)
Sodium: 133 mEq/L — ABNORMAL LOW (ref 135–145)
Total Bilirubin: 0.4 mg/dL (ref 0.2–1.2)
Total Protein: 7.3 g/dL (ref 6.0–8.3)

## 2019-05-04 LAB — TSH: TSH: 2.14 u[IU]/mL (ref 0.35–4.50)

## 2019-05-04 LAB — CBC
HCT: 32.4 % — ABNORMAL LOW (ref 36.0–46.0)
Hemoglobin: 11.2 g/dL — ABNORMAL LOW (ref 12.0–15.0)
MCHC: 34.6 g/dL (ref 30.0–36.0)
MCV: 99.3 fl (ref 78.0–100.0)
Platelets: 290 10*3/uL (ref 150.0–400.0)
RBC: 3.26 Mil/uL — ABNORMAL LOW (ref 3.87–5.11)
RDW: 12.6 % (ref 11.5–15.5)
WBC: 6.9 10*3/uL (ref 4.0–10.5)

## 2019-05-04 LAB — HEMOGLOBIN A1C: Hgb A1c MFr Bld: 5.2 % (ref 4.6–6.5)

## 2019-05-04 LAB — VITAMIN B12: Vitamin B-12: 238 pg/mL (ref 211–911)

## 2019-05-04 NOTE — Progress Notes (Signed)
Please inform patient of the following:  Her B12 is low and she is slightly anemic but all of her other blood work is stable. Recommend we start her on the b12 protocol here. Would like for her to let us know if her symptoms are not improving over the next couple of weeks.  Algis Greenhouse. Jerline Pain, MD 05/04/2019 1:09 PM

## 2019-05-07 ENCOUNTER — Other Ambulatory Visit: Payer: Self-pay

## 2019-05-07 ENCOUNTER — Encounter: Payer: Self-pay | Admitting: Family Medicine

## 2019-05-07 MED ORDER — ESTROGENS, CONJUGATED 0.625 MG/GM VA CREA: TOPICAL_CREAM | VAGINAL | 2 refills | Status: DC

## 2019-05-07 NOTE — Telephone Encounter (Signed)
Patient notified per Junious Dresser. Rx sent patient notified

## 2019-05-08 ENCOUNTER — Other Ambulatory Visit: Payer: Self-pay

## 2019-05-08 ENCOUNTER — Ambulatory Visit (INDEPENDENT_AMBULATORY_CARE_PROVIDER_SITE_OTHER): Payer: Medicare Other

## 2019-05-08 DIAGNOSIS — E538 Deficiency of other specified B group vitamins: Secondary | ICD-10-CM

## 2019-05-08 MED ORDER — CYANOCOBALAMIN 1000 MCG/ML IJ SOLN
1000.0000 ug | Freq: Once | INTRAMUSCULAR | Status: AC
  Administered 2019-05-08: 1000 ug via INTRAMUSCULAR

## 2019-05-08 MED ORDER — CYANOCOBALAMIN 1000 MCG/ML IJ SOLN: 1000.0000 ug | Freq: Once | INTRAMUSCULAR | Status: DC

## 2019-05-08 NOTE — Progress Notes (Signed)
I have reviewed the patient's encounter and agree with the documentation.  Algis Greenhouse. Jerline Pain, MD 05/08/2019 10:38 AM

## 2019-05-08 NOTE — Progress Notes (Signed)
Patient came in today for Vitamin B12 injection. Patient tolerated her injection well. No questions or concerns at this time.

## 2019-05-10 ENCOUNTER — Telehealth: Payer: Self-pay | Admitting: Family Medicine

## 2019-05-10 NOTE — Telephone Encounter (Signed)
I left a message asking the patient to call and schedule Medicare AWV with [name], possibly combined with one of her other appointments.  If patient calls back, please schedule Medicare Wellness Visit (initial) at next available opening. VDM ([place])

## 2019-05-15 ENCOUNTER — Ambulatory Visit (INDEPENDENT_AMBULATORY_CARE_PROVIDER_SITE_OTHER): Payer: Medicare Other

## 2019-05-15 ENCOUNTER — Other Ambulatory Visit: Payer: Self-pay

## 2019-05-15 DIAGNOSIS — E538 Deficiency of other specified B group vitamins: Secondary | ICD-10-CM | POA: Diagnosis not present

## 2019-05-15 MED ORDER — CYANOCOBALAMIN 1000 MCG/ML IJ SOLN
1000.0000 ug | Freq: Once | INTRAMUSCULAR | Status: AC
  Administered 2019-05-15: 1000 ug via INTRAMUSCULAR

## 2019-05-15 NOTE — Progress Notes (Signed)
Per orders of Dr. Jerline Pain, injection of Vitamin B12 given by Tobe Sos in right deltoid. Patient tolerated injection well. Patient will make appointment for 1 week.

## 2019-05-22 ENCOUNTER — Ambulatory Visit (INDEPENDENT_AMBULATORY_CARE_PROVIDER_SITE_OTHER): Payer: Medicare Other

## 2019-05-22 ENCOUNTER — Encounter: Payer: Self-pay | Admitting: Family Medicine

## 2019-05-22 ENCOUNTER — Other Ambulatory Visit: Payer: Self-pay

## 2019-05-22 VITALS — BP 124/70 | HR 94 | Temp 97.1°F | Ht 65.0 in | Wt 143.6 lb

## 2019-05-22 DIAGNOSIS — Z780 Asymptomatic menopausal state: Secondary | ICD-10-CM | POA: Diagnosis not present

## 2019-05-22 DIAGNOSIS — E538 Deficiency of other specified B group vitamins: Secondary | ICD-10-CM

## 2019-05-22 DIAGNOSIS — M8580 Other specified disorders of bone density and structure, unspecified site: Secondary | ICD-10-CM | POA: Diagnosis not present

## 2019-05-22 DIAGNOSIS — Z0000 Encounter for general adult medical examination without abnormal findings: Secondary | ICD-10-CM

## 2019-05-22 MED ORDER — CYANOCOBALAMIN 1000 MCG/ML IJ SOLN
1000.0000 ug | Freq: Once | INTRAMUSCULAR | Status: AC
  Administered 2019-05-22: 1000 ug via INTRAMUSCULAR

## 2019-05-22 NOTE — Patient Instructions (Signed)
[name] , Thank you for taking time to come for your Medicare Wellness Visit. I appreciate your ongoing commitment to your health goals. Please review the following plan we discussed and let me know if I can assist you in the future.   Screening recommendations/referrals: Colorectal Screening: No longer indicated  Mammogram: up to date; last 12/11/18 Bone Density: recommended every 2-5 years   Vision and Dental Exams: Recommended annual ophthalmology exams for early detection of glaucoma and other disorders of the eye Recommended annual dental exams for proper oral hygiene  Vaccinations: Influenza vaccine: completed 11/11/18 Pneumococcal vaccine: recommended both Prevnar and Pneumovaz  Tdap vaccine: recommended every 10 years; Please call your insurance company to determine your out of pocket expense. You also receive this vaccine at your local pharmacy or Health Dept. Shingles vaccine:. You may receive this vaccine at your local pharmacy. (see handout)   Advanced directives: Please bring a copy of your POA (Power of Attorney) and/or Living Will to your next appointment.  Goals: Recommend to drink at least 6-8 8oz glasses of water per day and consume a balanced diet rich in fresh fruits and vegetables.   Next appointment: Please schedule your Annual Wellness Visit with your Nurse Health Advisor in one year.  Preventive Care 40-64 Years, Female Preventive care refers to lifestyle choices and visits with your health care provider that can promote health and wellness. What does preventive care include?  A yearly physical exam. This is also called an annual well check.  Dental exams once or twice a year.  Routine eye exams. Ask your health care provider how often you should have your eyes checked.  Personal lifestyle choices, including:  Daily care of your teeth and gums.  Regular physical activity.  Eating a healthy diet.  Avoiding tobacco and drug use.  Limiting alcohol  use.  Practicing safe sex.  Taking low-dose aspirin daily starting at age 40 if recommended by your health care provider.  Taking vitamin and mineral supplements as recommended by your health care provider. What happens during an annual well check? The services and screenings done by your health care provider during your annual well check will depend on your age, overall health, lifestyle risk factors, and family history of disease. Counseling  Your health care provider may ask you questions about your:  Alcohol use.  Tobacco use.  Drug use.  Emotional well-being.  Home and relationship well-being.  Sexual activity.  Eating habits.  Work and work Statistician.  Method of birth control.  Menstrual cycle.  Pregnancy history. Screening  You may have the following tests or measurements:  Height, weight, and BMI.  Blood pressure.  Lipid and cholesterol levels. These may be checked every 5 years, or more frequently if you are over 63 years old.  Skin check.  Lung cancer screening. You may have this screening every year starting at age 89 if you have a 30-pack-year history of smoking and currently smoke or have quit within the past 15 years.  Fecal occult blood test (FOBT) of the stool. You may have this test every year starting at age 44.  Flexible sigmoidoscopy or colonoscopy. You may have a sigmoidoscopy every 5 years or a colonoscopy every 10 years starting at age 38.  Hepatitis C blood test.  Hepatitis B blood test.  Sexually transmitted disease (STD) testing.  Diabetes screening. This is done by checking your blood sugar (glucose) after you have not eaten for a while (fasting). You may have this done every  1-3 years.  Mammogram. This may be done every 1-2 years. Talk to your health care provider about when you should start having regular mammograms. This may depend on whether you have a family history of breast cancer.  BRCA-related cancer screening. This may  be done if you have a family history of breast, ovarian, tubal, or peritoneal cancers.  Pelvic exam and Pap test. This may be done every 3 years starting at age 53. Starting at age 53, this may be done every 5 years if you have a Pap test in combination with an HPV test.  Bone density scan. This is done to screen for osteoporosis. You may have this scan if you are at high risk for osteoporosis. Discuss your test results, treatment options, and if necessary, the need for more tests with your health care provider. Vaccines  Your health care provider may recommend certain vaccines, such as:  Influenza vaccine. This is recommended every year.  Tetanus, diphtheria, and acellular pertussis (Tdap, Td) vaccine. You may need a Td booster every 10 years.  Zoster vaccine. You may need this after age 39.  Pneumococcal 13-valent conjugate (PCV13) vaccine. You may need this if you have certain conditions and were not previously vaccinated.  Pneumococcal polysaccharide (PPSV23) vaccine. You may need one or two doses if you smoke cigarettes or if you have certain conditions. Talk to your health care provider about which screenings and vaccines you need and how often you need them. This information is not intended to replace advice given to you by your health care provider. Make sure you discuss any questions you have with your health care provider. Document Released: 02/21/2015 Document Revised: 10/15/2015 Document Reviewed: 11/26/2014 Elsevier Interactive Patient Education  2017 [place] Prevention in the Home Falls can cause injuries. They can happen to people of all ages. There are many things you can do to make your home safe and to help prevent falls. What can I do on the outside of my home?  Regularly fix the edges of walkways and driveways and fix any cracks.  Remove anything that might make you trip as you walk through a door, such as a raised step or threshold.  Trim any  bushes or trees on the path to your home.  Use bright outdoor lighting.  Clear any walking paths of anything that might make someone trip, such as rocks or tools.  Regularly check to see if handrails are loose or broken. Make sure that both sides of any steps have handrails.  Any raised decks and porches should have guardrails on the edges.  Have any leaves, snow, or ice cleared regularly.  Use sand or salt on walking paths during winter.  Clean up any spills in your garage right away. This includes oil or grease spills. What can I do in the bathroom?  Use night lights.  Install grab bars by the toilet and in the tub and shower. Do not use towel bars as grab bars.  Use non-skid mats or decals in the tub or shower.  If you need to sit down in the shower, use a plastic, non-slip stool.  Keep the floor dry. Clean up any water that spills on the floor as soon as it happens.  Remove soap buildup in the tub or shower regularly.  Attach bath mats securely with double-sided non-slip rug tape.  Do not have throw rugs and other things on the floor that can make you trip. What can I do  in the bedroom?  Use night lights.  Make sure that you have a light by your bed that is easy to reach.  Do not use any sheets or blankets that are too big for your bed. They should not hang down onto the floor.  Have a firm chair that has side arms. You can use this for support while you get dressed.  Do not have throw rugs and other things on the floor that can make you trip. What can I do in the kitchen?  Clean up any spills right away.  Avoid walking on wet floors.  Keep items that you use a lot in easy-to-reach places.  If you need to reach something above you, use a strong step stool that has a grab bar.  Keep electrical cords out of the way.  Do not use floor polish or wax that makes floors slippery. If you must use wax, use non-skid floor wax.  Do not have throw rugs and other  things on the floor that can make you trip. What can I do with my stairs?  Do not leave any items on the stairs.  Make sure that there are handrails on both sides of the stairs and use them. Fix handrails that are broken or loose. Make sure that handrails are as long as the stairways.  Check any carpeting to make sure that it is firmly attached to the stairs. Fix any carpet that is loose or worn.  Avoid having throw rugs at the top or bottom of the stairs. If you do have throw rugs, attach them to the floor with carpet tape.  Make sure that you have a light switch at the top of the stairs and the bottom of the stairs. If you do not have them, ask someone to add them for you. What else can I do to help prevent falls?  Wear shoes that:  Do not have high heels.  Have rubber bottoms.  Are comfortable and fit you well.  Are closed at the toe. Do not wear sandals.  If you use a stepladder:  Make sure that it is fully opened. Do not climb a closed stepladder.  Make sure that both sides of the stepladder are locked into place.  Ask someone to hold it for you, if possible.  Clearly mark and make sure that you can see:  Any grab bars or handrails.  First and last steps.  Where the edge of each step is.  Use tools that help you move around (mobility aids) if they are needed. These include:  Canes.  Walkers.  Scooters.  Crutches.  Turn on the lights when you go into a dark area. Replace any light bulbs as soon as they burn out.  Set up your furniture so you have a clear path. Avoid moving your furniture around.  If any of your floors are uneven, fix them.  If there are any pets around you, be aware of where they are.  Review your medicines with your doctor. Some medicines can make you feel dizzy. This can increase your chance of falling. Ask your doctor what other things that you can do to help prevent falls. This information is not intended to replace advice given to  you by your health care provider. Make sure you discuss any questions you have with your health care provider. Document Released: 11/21/2008 Document Revised: 07/03/2015 Document Reviewed: 03/01/2014 Elsevier Interactive Patient Education  2017 [employer].

## 2019-05-22 NOTE — Progress Notes (Signed)
Subjective:   Linda Knight is a 79 y.o. female who presents for Medicare Annual (Subsequent) preventive examination.  Review of Systems:   Cardiac Risk Factors include: advanced age (>36men, >78 women);hypertension;dyslipidemia    Objective:     Vitals: BP 124/70   Pulse 94   Temp (!) 97.1 F (36.2 C) (Temporal)   Ht 5\' 5"  (1.651 m)   Wt 143 lb 9.6 oz (65.1 kg)   SpO2 98%   BMI 23.90 kg/m   Body mass index is 23.9 kg/m.  Advanced Directives 05/22/2019 09/03/2018  Does Patient Have a Medical Advance Directive? Yes No  Type of Advance Directive Living will;Healthcare Power of Attorney -  Does patient want to make changes to medical advance directive? No - Patient declined -  Copy of [place] in Chart? No - copy requested -  Would patient like information on creating a medical advance directive? - No - Patient declined    Tobacco Social History   Tobacco Use  Smoking Status Former Smoker  . Quit date: 09/18/1980  . Years since quitting: 38.6  Smokeless Tobacco Never Used     Counseling given: Not Answered   Clinical Intake:  Pre-visit preparation completed: Yes  Pain : 0-10 Pain Score: 5  Pain Type: Chronic pain Pain Location: Ear Pain Orientation: Right, Left Pain Descriptors / Indicators: Throbbing, Nagging Pain Onset: More than a month ago Pain Frequency: Constant  Diabetes: No  How often do you need to have someone help you when you read instructions, pamphlets, or other written materials from your doctor or pharmacy?: 1 - Never  Interpreter Needed?: No  Information entered by :: Denman George LPN  Past Medical History:  Diagnosis Date  . Arthritis   . CAD (coronary artery disease)   . High cholesterol   . Hypertension   . Thyroid disease    Past Surgical History:  Procedure Laterality Date  . ABDOMINAL HYSTERECTOMY    . APPENDECTOMY    . BREAST EXCISIONAL BIOPSY Right   . BUNIONECTOMY     Family History    Problem Relation Age of Onset  . Hyperlipidemia Mother   . Hypertension Mother   . Stroke Mother   . Lung disease Father   . Heart disease Brother   . Colon cancer Neg Hx    Social History   Socioeconomic History  . Marital status: Married    Spouse name: Not on file  . Number of children: Not on file  . Years of education: Not on file  . Highest education level: Not on file  Occupational History  . Occupation: Retired   Tobacco Use  . Smoking status: Former Smoker    Quit date: 09/18/1980    Years since quitting: 38.6  . Smokeless tobacco: Never Used  Substance and Sexual Activity  . Alcohol use: Yes  . Drug use: Never  . Sexual activity: Not on file  Other Topics Concern  . Not on file  Social History Narrative   Moved from [facility] July 2020   Social Determinants of Health   Financial Resource Strain:   . Difficulty of Paying Living Expenses:   Food Insecurity:   . Worried About Charity fundraiser in the Last Year:   . Arboriculturist in the Last Year:   Transportation Needs:   . Film/video editor (Medical):   Marland Kitchen Lack of Transportation (Non-Medical):   Physical Activity:   . Days of Exercise per  Week:   . Minutes of Exercise per Session:   Stress:   . Feeling of Stress :   Social Connections:   . Frequency of Communication with Friends and Family:   . Frequency of Social Gatherings with Friends and Family:   . Attends Religious Services:   . Active Member of Clubs or Organizations:   . Attends Archivist Meetings:   Marland Kitchen Marital Status:     Outpatient Encounter Medications as of 05/22/2019  Medication Sig  . amLODipine-benazepril (LOTREL) 5-10 MG capsule Take 1 capsule by mouth daily.  Marland Kitchen atorvastatin (LIPITOR) 20 MG tablet Take 10 mg by mouth daily.   . bisacodyl (GENTLE LAXATIVE) 5 MG EC tablet Take 5 mg by mouth daily as needed for moderate constipation.  . Cholecalciferol (VITAMIN D3 PO) Take 1,000 Units by mouth.  . Coenzyme Q10 (CO Q  10 PO) Take 200 mg by mouth.  . conjugated estrogens (PREMARIN) vaginal cream 0.5 g intravaginally twice per week then taper to maintenance dose.  . Cranberry 1000 MG CAPS Take 4,200 mg by mouth.  . estrogens, conjugated, (PREMARIN) 0.3 MG tablet Take 0.3 mg by mouth 3 (three) times a week. Take daily for 21 days then do not take for 7 days.   . hydrochlorothiazide (HYDRODIURIL) 25 MG tablet Take 1 tablet (25 mg total) by mouth daily. Take 25 mg by mouth daily.  Marland Kitchen levothyroxine (SYNTHROID) 75 MCG tablet Take 1 tablet (75 mcg total) by mouth daily before breakfast. Patient taking 0.5 tablet daily  . Probiotic Product (PROBIOTIC-10 PO) Take 20 BAU by mouth.  . zolpidem (AMBIEN) 10 MG tablet Take 10 mg by mouth at bedtime as needed for sleep.  . temazepam (RESTORIL) 7.5 MG capsule Take 1 capsule (7.5 mg total) by mouth at bedtime as needed for sleep. (Patient not taking: Reported on 05/22/2019)  . [EXPIRED] cyanocobalamin ((VITAMIN B-12)) injection 1,000 mcg    No facility-administered encounter medications on file as of 05/22/2019.    Activities of Daily Living In your present state of health, do you have any difficulty performing the following activities: 05/22/2019  Hearing? Y  Vision? N  Difficulty concentrating or making decisions? N  Walking or climbing stairs? N  Dressing or bathing? N  Doing errands, shopping? N  Preparing Food and eating ? N  Using the Toilet? N  In the past six months, have you accidently leaked urine? N  Do you have problems with loss of bowel control? N  Managing your Medications? N  Managing your Finances? N  Housekeeping or managing your Housekeeping? N    Patient Care Team: Vivi Barrack, MD as PCP - General (Family Medicine) Dian Situ, MD as Consulting Physician (Pain Medicine)    Assessment:   This is a routine wellness examination for Linda Knight.  Exercise Activities and Dietary recommendations Current Exercise Habits: The patient does not  participate in regular exercise at present  Goals   None     Fall Risk Fall Risk  05/22/2019 11/27/2018 09/19/2018  Falls in the past year? 0 0 0  Number falls in past yr: 0 - -  Injury with Fall? 0 - -  Follow up Falls evaluation completed;Education provided;Falls prevention discussed - -   Is the patient's home free of loose throw rugs in walkways, pet beds, electrical cords, etc?   yes      Grab bars in the bathroom? yes      Handrails on the stairs?   yes  Adequate lighting?   yes  Timed Get Up and Go performed: completed and within normal timeframe; no gait abnormalities noted   Depression Screen PHQ 2/9 Scores 05/22/2019 11/27/2018 09/19/2018  PHQ - 2 Score 0 0 0  PHQ- 9 Score - 2 -     Cognitive Function     6CIT Screen 05/22/2019  What Year? 0 points  What month? 0 points  What time? 0 points  Count back from 20 0 points  Months in reverse 0 points  Repeat phrase 0 points  Total Score 0    Immunization History  Administered Date(s) Administered  . Influenza, High Dose Seasonal PF 11/11/2018  . PFIZER SARS-COV-2 Vaccination 03/25/2019, 04/17/2019    Qualifies for Shingles Vaccine? Patient has completed 1st Shingrix and is aware that 2nd is needed   Screening Tests Health Maintenance  Topic Date Due  . DEXA SCAN  Never done  . PNA vac Low Risk Adult (2 of 2 - PPSV23) 11/24/2018  . TETANUS/TDAP  11/27/2019 (Originally 01/26/1960)  . INFLUENZA VACCINE  09/09/2019    Cancer Screenings: Lung: Low Dose CT Chest recommended if Age 62-80 years, 30 pack-year currently smoking OR have quit w/in 15years. Patient does not qualify. Breast:  Up to date on Mammogram? Yes   Up to date of Bone Density/Dexa? No; ordered to be done with next mammogram  Colorectal: No longer indicated     Plan:  I have personally reviewed and addressed the Medicare Annual Wellness questionnaire and have noted the following in the patient's chart:  A. Medical and social  history B. Use of alcohol, tobacco or illicit drugs  C. Current medications and supplements D. Functional ability and status E.  Nutritional status F.  Physical activity G. Advance directives H. List of other physicians I.  Hospitalizations, surgeries, and ER visits in previous 12 months J.  [place] such as hearing and vision if needed, cognitive and depression L. Referrals, records requested, and appointments- dexa ordered   In addition, I have reviewed and discussed with patient certain preventive protocols, quality metrics, and best practice recommendations. A written personalized care plan for preventive services as well as general preventive health recommendations were provided to patient.   Signed,  Denman George, LPN  Nurse Health Advisor   Nurse Notes: B12 injection administered as ordered.   No voiced complaints.  Patient scheduled for return for next injection.   Patient also with continued complaints of tinnitus.  She did see ENT (requesting records).  She is asking if there are any medications that can be prescribed to provide relief.    She also tried Temazepam and states that it was ineffective.  She took 7.5mg  on one night and 15mg  on another.  She states that she previously had and rx for Zolpidem 10mg  from her previous provider and is asking if she can have a refill of this.  Please advise.

## 2019-05-23 ENCOUNTER — Telehealth: Payer: Self-pay

## 2019-05-23 NOTE — Telephone Encounter (Signed)
Patient also with continued complaints of tinnitus.  She did see ENT (requesting records).  She is asking if there are any medications that can be prescribed to provide relief.    She also tried Temazepam and states that it was ineffective.  She took 7.5mg  on one night and 15mg  on another.  She states that she previously had and rx for Zolpidem 10mg  from her previous provider and is asking if she can have a refill of this.  Please advise.

## 2019-05-29 ENCOUNTER — Ambulatory Visit: Payer: Medicare Other

## 2019-05-29 ENCOUNTER — Other Ambulatory Visit: Payer: Self-pay

## 2019-05-29 ENCOUNTER — Ambulatory Visit (INDEPENDENT_AMBULATORY_CARE_PROVIDER_SITE_OTHER): Payer: Medicare Other | Admitting: Family Medicine

## 2019-05-29 VITALS — BP 144/80 | HR 75 | Temp 96.9°F | Ht 65.0 in | Wt 143.2 lb

## 2019-05-29 DIAGNOSIS — I10 Essential (primary) hypertension: Secondary | ICD-10-CM

## 2019-05-29 DIAGNOSIS — E538 Deficiency of other specified B group vitamins: Secondary | ICD-10-CM | POA: Diagnosis not present

## 2019-05-29 DIAGNOSIS — G4700 Insomnia, unspecified: Secondary | ICD-10-CM | POA: Diagnosis not present

## 2019-05-29 MED ORDER — CYANOCOBALAMIN 1000 MCG/ML IJ SOLN
1000.0000 ug | Freq: Once | INTRAMUSCULAR | Status: AC
  Administered 2019-05-29: 1000 ug via INTRAMUSCULAR

## 2019-05-29 MED ORDER — ZOLPIDEM TARTRATE ER 12.5 MG PO TBCR: 12.5000 mg | EXTENDED_RELEASE_TABLET | Freq: Every evening | ORAL | 0 refills | Status: DC | PRN

## 2019-05-29 NOTE — Patient Instructions (Signed)
It was very nice to see you today!  We will change your Ambien to sustained-release 12.5 mg tablet.  Please check in with me in a couple weeks let me know how this is working for you.  We may potentially get you back on Celexa depending on your response to Ambien.  If things are going well, we will see you back in 6 months.  Please come back to see me sooner if needed.  Take care, Dr Jerline Pain  Please try these tips to maintain a healthy lifestyle:   Eat at least 3 REAL meals and 1-2 snacks per day.  Aim for no more than 5 hours between eating.  If you eat breakfast, please do so within one hour of getting up.    Each meal should contain half fruits/vegetables, one quarter protein, and one quarter carbs (no bigger than a computer mouse)   Cut down on sweet beverages. This includes juice, soda, and sweet tea.     Drink at least 1 glass of water with each meal and aim for at least 8 glasses per day   Exercise at least 150 minutes every week.

## 2019-05-29 NOTE — Assessment & Plan Note (Signed)
B12 injection given today.  Will need repeat B12 level in 3 to 6 months.

## 2019-05-29 NOTE — Addendum Note (Signed)
Addended by: Betti Cruz on: 05/29/2019 10:00 AM   Modules accepted: Orders

## 2019-05-29 NOTE — Assessment & Plan Note (Signed)
Discussed treatment options.  Advised patient did not take Ambien and Restoril at the same time.  We will switch to controlled release Ambien 12.5 mg nightly.  She will check with me in a couple of weeks.  We may start her back on Celexa depending on response to Ambien.  If continues to have issues with sleep, would consider switching to other benzo such as Valium given her concurrent tinnitus.

## 2019-05-29 NOTE — Assessment & Plan Note (Signed)
At goal per JNC 8.  Continue amlodipine-benazepril 5-10 and HCTZ 25 mg daily.

## 2019-05-29 NOTE — Progress Notes (Signed)
   Linda Knight is a 79 y.o. female who presents today for an office visit.  Assessment/Plan:  Chronic Problems Addressed Today: B12 deficiency B12 injection given today.  Will need repeat B12 level in 3 to 6 months.  Insomnia Discussed treatment options.  Advised patient did not take Ambien and Restoril at the same time.  We will switch to controlled release Ambien 12.5 mg nightly.  She will check with me in a couple of weeks.  We may start her back on Celexa depending on response to Ambien.  If continues to have issues with sleep, would consider switching to other benzo such as Valium given her concurrent tinnitus.  Essential hypertension At goal per JNC 8.  Continue amlodipine-benazepril 5-10 and HCTZ 25 mg daily.     Subjective:  HPI:  Patient here for follow-up.  Last seen about a month ago.  We started her on Restoril to help with sleep and concurrent tenderness.  Unfortunately does not work well for her.  She went back to Ambien which is worked better.  She has been taking the Restoril and Ambien on the same night.  She states that this works well.  No obvious somnolence or respiratory depression.  She was also started on B12 protocol.  She has noticed improvement in her daytime energy levels after getting replacement shots.       Objective:  Physical Exam: BP (!) 144/80   Pulse 75   Temp (!) 96.9 F (36.1 C)   Ht 5\' 5"  (1.651 m)   Wt 143 lb 3.2 oz (65 kg)   SpO2 99%   BMI 23.83 kg/m   Gen: No acute distress, resting comfortably CV: Regular rate and rhythm with no murmurs appreciated Pulm: Normal work of breathing, clear to auscultation bilaterally with no crackles, wheezes, or rhonchi Neuro: Grossly normal, moves all extremities Psych: Normal affect and thought content      [name] M. Jerline Pain, MD 05/29/2019 9:40 AM

## 2019-06-08 ENCOUNTER — Encounter: Payer: Self-pay | Admitting: Family Medicine

## 2019-06-13 NOTE — Telephone Encounter (Signed)
Pt requesting Rx Ambien refill  

## 2019-06-13 NOTE — Telephone Encounter (Signed)
  LAST APPOINTMENT DATE: 05/29/2019   NEXT APPOINTMENT DATE:@Visit  date not found  MEDICATION:zolpidem (AMBIEN CR) 12.5 MG CR tablet  PHARMACY:[id number] - [place], [place] - [address] AT [place]: Patient was notified that the max is 12.5 MG.   **Let patient know to contact pharmacy at the end of the day to make sure medication is ready. **  ** Please notify patient to allow 48-72 hours to process**  **Encourage patient to contact the pharmacy for refills or they can request refills through [facility]**  CLINICAL FILLS OUT ALL BELOW:   LAST REFILL:  QTY:  REFILL DATE:    OTHER COMMENTS:    Okay for refill?  Please advise

## 2019-06-15 ENCOUNTER — Other Ambulatory Visit: Payer: Self-pay | Admitting: Pain Medicine

## 2019-06-15 DIAGNOSIS — M545 Low back pain, unspecified: Secondary | ICD-10-CM

## 2019-06-15 DIAGNOSIS — R200 Anesthesia of skin: Secondary | ICD-10-CM

## 2019-06-15 DIAGNOSIS — R202 Paresthesia of skin: Secondary | ICD-10-CM

## 2019-06-15 DIAGNOSIS — M79605 Pain in left leg: Secondary | ICD-10-CM

## 2019-06-19 ENCOUNTER — Encounter: Payer: Self-pay | Admitting: Family Medicine

## 2019-06-19 ENCOUNTER — Other Ambulatory Visit: Payer: Self-pay | Admitting: Family Medicine

## 2019-06-19 MED ORDER — ZOLPIDEM TARTRATE ER 12.5 MG PO TBCR: 12.5000 mg | EXTENDED_RELEASE_TABLET | Freq: Every evening | ORAL | 5 refills | Status: DC | PRN

## 2019-06-19 NOTE — Telephone Encounter (Signed)
Requesting Rx Ambien  Last refill on 05/29/2019 Last visit on 04/208/2021

## 2019-06-19 NOTE — Telephone Encounter (Signed)
LAST APPOINTMENT DATE: 05/29/2019   NEXT APPOINTMENT DATE: Visit date not found   RX AMBIEN LAST REFILL: 05/29/2019  QTY:30 0RF

## 2019-07-10 ENCOUNTER — Other Ambulatory Visit: Payer: Self-pay | Admitting: Urology

## 2019-07-10 DIAGNOSIS — K862 Cyst of pancreas: Secondary | ICD-10-CM

## 2019-07-12 ENCOUNTER — Other Ambulatory Visit: Payer: Self-pay

## 2019-07-12 ENCOUNTER — Ambulatory Visit
Admission: RE | Admit: 2019-07-12 | Discharge: 2019-07-12 | Disposition: A | Discharge status: Home / Self Care | Payer: Medicare Other | Source: Ambulatory Visit | Attending: Pain Medicine | Admitting: Pain Medicine

## 2019-07-12 DIAGNOSIS — M545 Low back pain, unspecified: Secondary | ICD-10-CM

## 2019-07-12 DIAGNOSIS — M79605 Pain in left leg: Secondary | ICD-10-CM

## 2019-07-12 DIAGNOSIS — R200 Anesthesia of skin: Secondary | ICD-10-CM

## 2019-07-17 ENCOUNTER — Other Ambulatory Visit: Payer: Self-pay | Admitting: Family Medicine

## 2019-07-17 ENCOUNTER — Encounter: Payer: Self-pay | Admitting: Family Medicine

## 2019-07-17 ENCOUNTER — Other Ambulatory Visit: Payer: Self-pay

## 2019-07-17 ENCOUNTER — Ambulatory Visit (INDEPENDENT_AMBULATORY_CARE_PROVIDER_SITE_OTHER): Payer: Medicare Other | Admitting: Family Medicine

## 2019-07-17 VITALS — BP 134/78 | HR 80 | Temp 97.8°F | Ht 65.0 in | Wt 137.2 lb

## 2019-07-17 DIAGNOSIS — N8110 Cystocele, unspecified: Secondary | ICD-10-CM

## 2019-07-17 DIAGNOSIS — F439 Reaction to severe stress, unspecified: Secondary | ICD-10-CM | POA: Insufficient documentation

## 2019-07-17 DIAGNOSIS — K862 Cyst of pancreas: Secondary | ICD-10-CM | POA: Insufficient documentation

## 2019-07-17 DIAGNOSIS — H9313 Tinnitus, bilateral: Secondary | ICD-10-CM

## 2019-07-17 DIAGNOSIS — G8929 Other chronic pain: Secondary | ICD-10-CM | POA: Diagnosis not present

## 2019-07-17 DIAGNOSIS — K8689 Other specified diseases of pancreas: Secondary | ICD-10-CM | POA: Insufficient documentation

## 2019-07-17 DIAGNOSIS — E039 Hypothyroidism, unspecified: Secondary | ICD-10-CM

## 2019-07-17 DIAGNOSIS — G4700 Insomnia, unspecified: Secondary | ICD-10-CM

## 2019-07-17 DIAGNOSIS — N951 Menopausal and female climacteric states: Secondary | ICD-10-CM

## 2019-07-17 DIAGNOSIS — M5440 Lumbago with sciatica, unspecified side: Secondary | ICD-10-CM

## 2019-07-17 MED ORDER — DIAZEPAM 5 MG PO TABS: 2.5000 mg | ORAL_TABLET | Freq: Two times a day (BID) | ORAL | 3 refills | Status: DC | PRN

## 2019-07-17 MED ORDER — AMLODIPINE BESY-BENAZEPRIL HCL 5-10 MG PO CAPS: 1.0000 | ORAL_CAPSULE | Freq: Every day | ORAL | 3 refills | Status: DC

## 2019-07-17 NOTE — Assessment & Plan Note (Signed)
Discussed treatment options. Will start low-dose Valium 2.5 mg twice daily as needed.  Discussed with patient that this would be only short-term prescription to use as needed while she is dealing with her other medical issues.  Discussed potential side effects.

## 2019-07-17 NOTE — Assessment & Plan Note (Signed)
Will place referral to GYN per patient request.

## 2019-07-17 NOTE — Progress Notes (Signed)
   Linda Knight is a 79 y.o. female who presents today for an office visit.  Assessment/Plan:  Chronic Problems Addressed Today: Pancreatic cyst Found on CT scan incidentally done by urology.  We will try to obtain records.  We will also place referral to GI.  Check amylase and lipase today.  Chronic low back pain with sciatica Stable.  No red flags.  Will be getting epidural steroid injection soon.  Insomnia We will start Valium which should help some with sleep as well.  Menopausal symptoms Will place referral to GYN per patient request.  Hypothyroidism Check TSH.  Continue Synthroid 75 mcg daily.  Female bladder prolapse Will place referral to gynecology per patient request.  Tinnitus of both ears Should hopefully have some improvement with Valium.  She has tolerated well in the past without side effects.  Stress Discussed treatment options. Will start low-dose Valium 2.5 mg twice daily as needed.  Discussed with patient that this would be only short-term prescription to use as needed while she is dealing with her other medical issues.  Discussed potential side effects.     Subjective:  HPI:  Patient last seen a couple of months ago.  Since her last visit she has followed up with pain specialist.  Had MRI done of the lower back which showed bulging disc.  She will be getting epidural steroid injections soon.  She is also followed up with urology since her last visit.  She was concerned about her bladder prolapse.  Reportedly had a CT scan done which showed a small "cyst" on her pancreas.  She will be having an MRI of her abdomen done later this month.  She has been making quite a bit of changes to her diet in hopes this will resolve the cyst.  She would like to be referred to GI today as well.  Pain seems to be improving.  No nausea or vomiting. No unintentional weight loss.  She has been under quite a bit of stress recently.  Would like medication to help with her  nerves.  She has tolerated Valium well in the past.       Objective:  Physical Exam: BP 134/78   Pulse 80   Temp 97.8 F (36.6 C)   Ht 5\' 5"  (1.651 m)   Wt 137 lb 4 oz (62.3 kg)   SpO2 99%   BMI 22.84 kg/m   Gen: No acute distress, resting comfortably CV: Regular rate and rhythm with no murmurs appreciated Pulm: Normal work of breathing, clear to auscultation bilaterally with no crackles, wheezes, or rhonchi Neuro: Grossly normal, moves all extremities Psych: Normal affect and thought content      Camdon Saetern M. Jerline Pain, MD 07/17/2019 2:56 PM

## 2019-07-17 NOTE — Assessment & Plan Note (Signed)
Check TSH.  Continue Synthroid 75 mcg daily. 

## 2019-07-17 NOTE — Patient Instructions (Signed)
It was very nice to see you today!  We will check blood work today.  I will place a referral for you to see the gynecologist, GI specialist,, and nutritionist.  Please take Valium as needed.  We will also refill your blood pressure medication.  Take care, Dr Jerline Pain  Please try these tips to maintain a healthy lifestyle:   Eat at least 3 REAL meals and 1-2 snacks per day.  Aim for no more than 5 hours between eating.  If you eat breakfast, please do so within one hour of getting up.    Each meal should contain half fruits/vegetables, one quarter protein, and one quarter carbs (no bigger than a computer mouse)   Cut down on sweet beverages. This includes juice, soda, and sweet tea.     Drink at least 1 glass of water with each meal and aim for at least 8 glasses per day   Exercise at least 150 minutes every week.

## 2019-07-17 NOTE — Assessment & Plan Note (Signed)
Stable.  No red flags.  Will be getting epidural steroid injection soon.

## 2019-07-17 NOTE — Assessment & Plan Note (Signed)
Will place referral to gynecology per patient request.

## 2019-07-17 NOTE — Assessment & Plan Note (Signed)
Should hopefully have some improvement with Valium.  She has tolerated well in the past without side effects.

## 2019-07-17 NOTE — Assessment & Plan Note (Signed)
We will start Valium which should help some with sleep as well.

## 2019-07-17 NOTE — Assessment & Plan Note (Signed)
Found on CT scan incidentally done by urology.  We will try to obtain records.  We will also place referral to GI.  Check amylase and lipase today.

## 2019-07-18 ENCOUNTER — Encounter: Payer: Self-pay | Admitting: Physician Assistant

## 2019-07-18 ENCOUNTER — Other Ambulatory Visit: Payer: Self-pay

## 2019-07-18 DIAGNOSIS — E039 Hypothyroidism, unspecified: Secondary | ICD-10-CM

## 2019-07-18 LAB — LIPASE: Lipase: 111 U/L — ABNORMAL HIGH (ref 11.0–59.0)

## 2019-07-18 LAB — AMYLASE: Amylase: 72 U/L (ref 27–131)

## 2019-07-18 LAB — TSH: TSH: 7.72 u[IU]/mL — ABNORMAL HIGH (ref 0.35–4.50)

## 2019-07-18 NOTE — Progress Notes (Signed)
Please inform patient of the following:  One of her pancreas numbers is up slightly.  Do not need to do anything else at this point but agree with her getting GI evaluation.Thyroid level is off.  Recommend increasing Synthroid to 88 mcg daily.  Would like for her to come back in 4 to 6 weeks to recheck TSH.  Algis Greenhouse. Jerline Pain, MD 07/18/2019 12:03 PM

## 2019-07-19 ENCOUNTER — Telehealth: Payer: Self-pay | Admitting: Family Medicine

## 2019-07-19 NOTE — Telephone Encounter (Signed)
Please advise 

## 2019-07-19 NOTE — Telephone Encounter (Signed)
Patient is calling in asking if Dr.Parker ever tested her cholesterol, if not then could he add that to the orders.

## 2019-07-20 NOTE — Telephone Encounter (Signed)
We check last year and it was mildly elevated - we can check again when she is due for her annual physical with labs later this year.  Linda Knight. Jerline Pain, MD 07/20/2019 2:22 PM

## 2019-07-20 NOTE — Telephone Encounter (Signed)
LVM to return call.

## 2019-07-23 ENCOUNTER — Other Ambulatory Visit: Payer: Self-pay | Admitting: *Deleted

## 2019-07-23 DIAGNOSIS — E039 Hypothyroidism, unspecified: Secondary | ICD-10-CM

## 2019-07-23 DIAGNOSIS — I10 Essential (primary) hypertension: Secondary | ICD-10-CM

## 2019-07-23 NOTE — Telephone Encounter (Signed)
Lab order

## 2019-07-23 NOTE — Telephone Encounter (Signed)
[id number]

## 2019-07-23 NOTE — Telephone Encounter (Signed)
Ok with me. Please place any necessary orders. 

## 2019-07-23 NOTE — Telephone Encounter (Signed)
Patient stated her diet change and will like to recheck her Cholesterol level, T3 and T4

## 2019-07-30 ENCOUNTER — Ambulatory Visit (INDEPENDENT_AMBULATORY_CARE_PROVIDER_SITE_OTHER): Payer: Medicare Other | Admitting: Obstetrics and Gynecology

## 2019-07-30 ENCOUNTER — Encounter: Payer: Self-pay | Admitting: Obstetrics and Gynecology

## 2019-07-30 ENCOUNTER — Other Ambulatory Visit: Payer: Self-pay

## 2019-07-30 VITALS — BP 124/82 | Ht 64.0 in | Wt 137.0 lb

## 2019-07-30 DIAGNOSIS — Z01419 Encounter for gynecological examination (general) (routine) without abnormal findings: Secondary | ICD-10-CM

## 2019-07-30 DIAGNOSIS — N819 Female genital prolapse, unspecified: Secondary | ICD-10-CM

## 2019-07-30 NOTE — Progress Notes (Signed)
Azana Kiesler 1941/01/25 [id number]  SUBJECTIVE:  79 y.o. G3P3 female new patient here for a breast and pelvic exam. She had a prior hysterectomy and "bladder repair" surgery and is concerned about possible recurrence of bladder prolapse.  Presented to urologist with abdominal pain and some trouble with emptying bladder.  Had a UTI months back.  No dysuria now.  CT was performed and she reports they found a spot on her pancreas and she is awaiting GI follow-up for that.  Abdominal pain resolved with dietary changes and bowel regimen to resolve constipation.  Moved here from [facility], lived in the [place] for long time.    Current Outpatient Medications  Medication Sig Dispense Refill   amLODipine-benazepril (LOTREL) 5-10 MG capsule Take 1 capsule by mouth daily. 90 capsule 3   bisacodyl (GENTLE LAXATIVE) 5 MG EC tablet Take 5 mg by mouth daily as needed for moderate constipation.     Cholecalciferol (VITAMIN D3 PO) Take 1,000 Units by mouth.     Coenzyme Q10 (CO Q 10 PO) Take 200 mg by mouth.     diazepam (VALIUM) 5 MG tablet Take 0.5-1 tablets (2.5-5 mg total) by mouth every 12 (twelve) hours as needed for anxiety. 60 tablet 3   Probiotic Product (PROBIOTIC-10 PO) Take 20 BAU by mouth.     zolpidem (AMBIEN CR) 12.5 MG CR tablet Take 1 tablet (12.5 mg total) by mouth at bedtime as needed for sleep. 30 tablet 5   hydrochlorothiazide (HYDRODIURIL) 25 MG tablet Take 1 tablet (25 mg total) by mouth daily. Take 25 mg by mouth daily. 90 tablet 1   levothyroxine (SYNTHROID) 75 MCG tablet Take 1 tablet (75 mcg total) by mouth daily before breakfast. Patient taking 0.5 tablet daily 90 tablet 0   No current facility-administered medications for this visit.   Allergies: Patient has no known allergies.  No LMP recorded. Patient is postmenopausal.  Past medical history,surgical history, problem list, medications, allergies, family history and social history were all reviewed and  documented as reviewed in the EPIC chart.  ROS:  Feeling well. No dyspnea or chest pain on exertion.  No abdominal pain, change in bowel habits, black or bloody stools.  No urinary tract symptoms. GYN ROS: no abnormal bleeding, pelvic pain or discharge, no breast pain or new or enlarging lumps on self exam. No neurological complaints.   OBJECTIVE:  Ht 5\' 4"  (1.626 m)    Wt 137 lb (62.1 kg)    BMI 23.52 kg/m  The patient appears well, alert, oriented x 3, in no distress. ENT normal.  Neck supple. No cervical or supraclavicular adenopathy or thyromegaly.  Lungs are clear, good air entry, no wheezes, rhonchi or rales. S1 and S2 normal, no murmurs, regular rate and rhythm.  Abdomen soft without tenderness, guarding, mass or organomegaly.  Neurological is normal, no focal findings.  BREAST EXAM: breasts appear normal, no suspicious masses, no skin or nipple changes or axillary nodes  PELVIC EXAM: VULVA: normal appearing vulva with no masses, tenderness or lesions, VAGINA: normal appearing vagina with normal color and discharge, no lesions, grade 2 cystocele, grade 2-3 rectocele, grade 1 internal vault prolapse, CERVIX: surgically absent, UTERUS: surgically absent, vaginal cuff normal, ADNEXA: no masses, RECTAL: normal rectal, no masses, rectovaginal exam confirms pelvic findings, rectocele noted  Chaperone: Caryn Bee present during the examination  ASSESSMENT:  79 y.o. G3P3  here for annual gynecologic exam  PLAN:   1. Postmenopausal. Prior hysterectomy for pelvic organ  prolapse shortly after childbirth.  She also had a "bladder tack".  No significant hot flashes or night sweats.  No vaginal bleeding.  We did not discuss HRT or vaginal estrogen cream today and it appears that she has been filling prescriptions for that from elsewhere.  She is welcome to further discuss with [place] any concerns or issues at any point. 2. Pap smear 3 years ago per patient report.  Denies any history of abnormal Pap  smears.  Comfortable not screening based on age and prior hysterectomy criteria according to most current guidelines. 3.  Pelvic organ prolapse.  We discussed that she does have a cystocele and rectocele with some vaginal vault prolapse.  We reviewed the anatomy of the pelvic floor using diagrams and I showed her where the prolapse is most prominent.  I do not think this would account for any abdominal pain symptoms, but could result in some urinary/defecatory dysfunction and vaginal pressure sensations.  We briefly discussed pelvic floor PT, pessary trial, or referral to urogynecology for surgical management.  She will let us know how she would like to proceed. 4. Mammogram 11/2018.  Normal breast exam today.  She is reminded to schedule an annual mammogram this year when due. 5. Colonoscopy 2019.  Recommended that she follow up at the recommended interval.  6. DEXA 10/2017.  Records not available to view today.  She follows with her primary care doctor for bone health and will continue to do so.   7. Health maintenance.  No labs today as she normally has these completed elsewhere.  Additional time was spent in today's encounter above and beyond the breast and pelvic exam in addressing the patient's pelvic organ prolapse findings and counseling.  Return annually or sooner, prn.  Joseph Pierini MD 07/30/19

## 2019-07-30 NOTE — Patient Instructions (Signed)
Sacrocolpopexy  Sacrocolpopexy is a surgical procedure that is done to return the top part of the vagina (vaginal vault) to its normal position inside the pelvis. This procedure is done when the vaginal vault falls down into the lower vagina (vaginal prolapse). It repairs the condition and relieves its symptoms, which may include bowel or bladder problems, backache, or dragging, aching feeling. You may need this procedure if your pelvic muscles have become weak after childbirth, or if you have had surgery to remove your uterus (hysterectomy). During the procedure, a surgeon may use a surgical mesh to lift and support your vagina. Tell a health care provider about:  Any allergies you have.  All medicines you are taking, including vitamins, herbs, eye drops, creams, and over-the-counter medicines.  Any problems you or family members have had with anesthetic medicines.  Any blood disorders you have.  Any surgeries you have had.  Any medical conditions you have.  Whether you are pregnant or may be pregnant. What are the risks? Generally, this is a safe procedure. However, problems may occur, including:  The prolapse happening again after surgery. This is the most common problem.  Bleeding.  Pain.  Pain or reduced sensation during sexual intercourse.  Infection.  Loss of urinary or bowel control.  Movement or loss of the surgical mesh.  Formation of a blood clot in the leg.  Having a blood clot travel to the lungs. Be sure to talk with your health care provider about the risks of this procedure and the options available to you for the management of your problem. What happens before the procedure? Staying hydrated Follow instructions from your health care provider about hydration, which may include:  Up to 2 hours before the procedure - you may continue to drink clear liquids, such as water, clear fruit juice, black coffee, and plain tea. Eating and drinking  restrictions Follow instructions from your health care provider about eating and drinking, which may include:  8 hours before the procedure - stop eating heavy meals or foods such as meat, fried foods, or fatty foods.  6 hours before the procedure - stop eating light meals or foods, such as toast or cereal.  6 hours before the procedure - stop drinking milk or drinks that contain milk.  2 hours before the procedure - stop drinking clear liquids. General instructions  Ask your health care provider about: ? Changing or stopping your regular medicines. This is especially important if you are taking diabetes medicines or blood thinners. ? Taking over-the-counter medicines, vitamins, herbs, and supplements. ? Taking medicines such as aspirin and ibuprofen. These medicines can thin your blood. Do not take these medicines unless your health care provider tells you to take them.  You may need to use a certain diet and medicines to clean out your digestive system (bowel prep): ? Follow your health care provider's instructions. Bowel prep is done to make sure your bowel is empty for the procedure. It can also prevent constipation. ? You may need to start your bowel prep a few days before the procedure.  You may be required to use a vaginal cream to strengthen your vaginal tissues. Use it as told by your health care provider.  Plan to have someone take you home from the hospital or clinic. What happens during the procedure?  To reduce your risk of infection: ? Your health care team will wash or sanitize their hands. ? Hair may be removed from the surgical area. ? Your  skin will be washed with soap.  An IV will be inserted into one of your veins.  You will be given one or more of the following: ? A medicine to help you relax (sedative). ? A medicine to make you fall asleep (general anesthetic). ? Amedicine that is injected into your spine to numb the area below and slightly above the  injection site (spinal anesthetic). ? An antibiotic medicine to prevent infection.  A tube (catheter) will be inserted to drain your bladder.  Your surgeon will perform the surgery using one of the following methods: ? Open surgery. The surgery will be done through a small incision made in the skin on your lower abdomen. ? Laparoscopic surgery. The surgery will be done through several tiny incisions, using long instruments and a telescopic camera. In some cases, laparoscopic surgery will be done by remote control (robotic surgery).  Your surgeon will make one or more incisions and then separate your vagina from your bowel and your bladder.  Your surgeon will push up the vaginal prolapse from below and attach a surgical mesh to your vagina.  The mesh will be used to lift your vaginal vault. It will be attached to a part of your tailbone with stitches (sutures) or staples.  Your surgeon will close the incisions with stitches or staples. The procedure may vary among health care providers and hospitals. What happens after the procedure?  Your blood pressure, heart rate, breathing rate, and blood oxygen level will be monitored until the medicines you were given have worn off.  You may have a bandage in your vagina for a few days.  You will be encouraged to walk when you can get out of bed. Walking helps prevent blood clots.  You will get fluids and nutrition through an IV until you can start eating on your own.  You will be given pain medicine as needed. You may also be given antibiotics to prevent infection and a medicine to prevent blood clots.  You may have your catheter removed soon after your surgery. Summary  Sacrocolpopexy is a surgical procedure that is done to repair vaginal prolapse, a condition in which the top part of the vagina (vaginal vault) has fallen down into the lower vagina.  The surgery will relieve symptoms such as bowel or bladder problems, backache, or dragging,  aching feeling.  During the procedure, a surgeon may use a surgical mesh to lift and support your vagina.  Follow instructions from your health care provider about eating and drinking before the procedure. This information is not intended to replace advice given to you by your health care provider. Make sure you discuss any questions you have with your health care provider. Document Revised: 01/07/2017 Document Reviewed: 04/22/2016 Elsevier Patient Education  [place].   Pelvic Organ Prolapse Pelvic organ prolapse is the stretching, bulging, or dropping of pelvic organs into an abnormal position. It happens when the muscles and tissues that surround and support pelvic structures become weak or stretched. Pelvic organ prolapse can involve the:  Vagina (vaginal prolapse).  Uterus (uterine prolapse).  Bladder (cystocele).  Rectum (rectocele).  Intestines (enterocele). When organs other than the vagina are involved, they often bulge into the vagina or protrude from the vagina, depending on how severe the prolapse is. What are the causes? This condition may be caused by:  Pregnancy, labor, and childbirth.  Past pelvic surgery.  Decreased production of the hormone estrogen associated with menopause.  Consistently lifting more than 50 lb (  23 kg).  Obesity.  Long-term inability to pass stool (chronic constipation).  A cough that lasts a long time (chronic).  Buildup of fluid in the abdomen due to certain diseases and other conditions. What are the signs or symptoms? Symptoms of this condition include:  Passing a little urine (loss of bladder control) when you cough, sneeze, strain, and exercise (stress incontinence). This may be worse immediately after childbirth. It may gradually improve over time.  Feeling pressure in your pelvis or vagina. This pressure may increase when you cough or when you are passing stool.  A bulge that protrudes from the opening of your  vagina.  Difficulty passing urine or stool.  Pain in your lower back.  Pain, discomfort, or disinterest in sex.  Repeated bladder infections (urinary tract infections).  Difficulty inserting a tampon. In some people, this condition causes no symptoms. How is this diagnosed? This condition may be diagnosed based on a vaginal and rectal exam. During the exam, you may be asked to cough and strain while you are lying down, sitting, and standing up. Your health care provider will determine if other tests are required, such as bladder function tests. How is this treated? Treatment for this condition may depend on your symptoms. Treatment may include:  Lifestyle changes, such as changes to your diet.  Emptying your bladder at scheduled times (bladder training therapy). This can help reduce or avoid urinary incontinence.  Estrogen. Estrogen may help mild prolapse by increasing the strength and tone of pelvic floor muscles.  Kegel exercises. These may help mild cases of prolapse by strengthening and tightening the muscles of the pelvic floor.  A soft, flexible device that helps support the vaginal walls and keep pelvic organs in place (pessary). This is inserted into your vagina by your health care provider.  Surgery. This is often the only form of treatment for severe prolapse. Follow these instructions at home:  Avoid drinking beverages that contain caffeine or alcohol.  Increase your intake of high-fiber foods. This can help decrease constipation and straining during bowel movements.  Lose weight if recommended by your health care provider.  Wear a sanitary pad or adult diapers if you have urinary incontinence.  Avoid heavy lifting and straining with exercise and work. Do not hold your breath when you perform mild to moderate lifting and exercise activities. Limit your activities as directed by your health care provider.  Do Kegel exercises as directed by your health care provider.  To do this: ? Squeeze your pelvic floor muscles tight. You should feel a tight lift in your rectal area and a tightness in your vaginal area. Keep your stomach, buttocks, and legs relaxed. ? Hold the muscles tight for up to 10 seconds. ? Relax your muscles. ? Repeat this exercise 50 times a day, or as many times as told by your health care provider. Continue to do this exercise for at least 4-6 weeks, or for as long as told by your health care provider.  Take over-the-counter and prescription medicines only as told by your health care provider.  If you have a pessary, take care of it as told by your health care provider.  Keep all follow-up visits as told by your health care provider. This is important. Contact a health care provider if you:  Have symptoms that interfere with your daily activities or sex life.  Need medicine to help with the discomfort.  Notice bleeding from your vagina that is not related to your period.  Have  a fever.  Have pain or bleeding when you urinate.  Have bleeding when you pass stool.  Pass urine when you have sex.  Have chronic constipation.  Have a pessary that falls out.  Have bad smelling vaginal discharge.  Have an unusual, low pain in your abdomen. Summary  Pelvic organ prolapse is the stretching, bulging, or dropping of pelvic organs into an abnormal position. It happens when the muscles and tissues that surround and support pelvic structures become weak or stretched.  When organs other than the vagina are involved, they often bulge into the vagina or protrude from the vagina, depending on how severe the prolapse is.  In most cases, this condition needs to be treated only if it produces symptoms. Treatment may include lifestyle changes, estrogen, Kegel exercises, pessary insertion, or surgery.  Avoid heavy lifting and straining with exercise and work. Do not hold your breath when you perform mild to moderate lifting and exercise activities.  Limit your activities as directed by your health care provider. This information is not intended to replace advice given to you by your health care provider. Make sure you discuss any questions you have with your health care provider. Document Revised: 02/16/2017 Document Reviewed: 02/16/2017 Elsevier Patient Education  2020 [employer].

## 2019-08-10 ENCOUNTER — Ambulatory Visit
Admission: RE | Admit: 2019-08-10 | Discharge: 2019-08-10 | Disposition: A | Discharge status: Home / Self Care | Payer: Medicare Other | Source: Ambulatory Visit | Attending: Urology | Admitting: Urology

## 2019-08-10 ENCOUNTER — Other Ambulatory Visit: Payer: Self-pay

## 2019-08-10 DIAGNOSIS — K862 Cyst of pancreas: Secondary | ICD-10-CM

## 2019-08-10 MED ORDER — GADOBENATE DIMEGLUMINE 529 MG/ML IV SOLN
12.0000 mL | Freq: Once | INTRAVENOUS | Status: AC | PRN
  Administered 2019-08-10: 12 mL via INTRAVENOUS

## 2019-08-15 ENCOUNTER — Other Ambulatory Visit (INDEPENDENT_AMBULATORY_CARE_PROVIDER_SITE_OTHER): Payer: Medicare Other

## 2019-08-15 ENCOUNTER — Other Ambulatory Visit: Payer: Self-pay

## 2019-08-15 DIAGNOSIS — E039 Hypothyroidism, unspecified: Secondary | ICD-10-CM | POA: Diagnosis not present

## 2019-08-15 DIAGNOSIS — K862 Cyst of pancreas: Secondary | ICD-10-CM

## 2019-08-15 DIAGNOSIS — I10 Essential (primary) hypertension: Secondary | ICD-10-CM

## 2019-08-15 LAB — COMPREHENSIVE METABOLIC PANEL
ALT: 20 U/L (ref 0–35)
AST: 21 U/L (ref 0–37)
Albumin: 4.6 g/dL (ref 3.5–5.2)
Alkaline Phosphatase: 78 U/L (ref 39–117)
BUN: 27 mg/dL — ABNORMAL HIGH (ref 6–23)
CO2: 27 mEq/L (ref 19–32)
Calcium: 10.2 mg/dL (ref 8.4–10.5)
Chloride: 92 mEq/L — ABNORMAL LOW (ref 96–112)
Creatinine, Ser: 1.16 mg/dL (ref 0.40–1.20)
GFR: 45.24 mL/min — ABNORMAL LOW (ref >60.00)
Glucose, Bld: 227 mg/dL — ABNORMAL HIGH (ref 70–99)
Potassium: 3.6 mEq/L (ref 3.5–5.1)
Sodium: 131 mEq/L — ABNORMAL LOW (ref 135–145)
Total Bilirubin: 0.4 mg/dL (ref 0.2–1.2)
Total Protein: 7.3 g/dL (ref 6.0–8.3)

## 2019-08-15 LAB — LIPID PANEL
Cholesterol: 258 mg/dL — ABNORMAL HIGH (ref 0–200)
HDL: 82.9 mg/dL (ref >39.00)
LDL Cholesterol: 145 mg/dL — ABNORMAL HIGH (ref 0–99)
NonHDL: 174.85
Total CHOL/HDL Ratio: 3
Triglycerides: 147 mg/dL (ref 0.0–149.0)
VLDL: 29.4 mg/dL (ref 0.0–40.0)

## 2019-08-15 LAB — T3: T3, Total: 64 ng/dL — ABNORMAL LOW (ref 76–181)

## 2019-08-15 LAB — T4: T4, Total: 11.1 ug/dL (ref 5.1–11.9)

## 2019-08-15 LAB — TSH: TSH: 0.25 u[IU]/mL — ABNORMAL LOW (ref 0.35–4.50)

## 2019-08-15 NOTE — Addendum Note (Signed)
Addended by: Doran Clay A on: 08/15/2019 09:47 AM   Modules accepted: Orders

## 2019-08-16 ENCOUNTER — Ambulatory Visit: Payer: Medicare Other | Admitting: Physician Assistant

## 2019-08-16 NOTE — Progress Notes (Signed)
Please inform patient of the following:  Thyroid level looks much better. Would like for her to keep at her current dose and we can recheck in 3 months or so.  Linda Knight. Linda Pain, MD 08/16/2019 12:40 PM

## 2019-08-21 ENCOUNTER — Telehealth: Payer: Self-pay | Admitting: *Deleted

## 2019-08-21 NOTE — Telephone Encounter (Signed)
Patient called to follow up with OV on 07/30/19 she would like to proceed with referral to urogynecology. I will proceed with referral.

## 2019-08-21 NOTE — Telephone Encounter (Signed)
Thank you :)

## 2019-08-21 NOTE — Telephone Encounter (Signed)
Patient scheduled with Dr.Catherine Zigmund Daniel on 10/31/19 @ 1:00pm, left message for patient to call.

## 2019-08-21 NOTE — Telephone Encounter (Signed)
Patient informed with time and date, notes faxed. Patient informed she will be placed on cancellation list.

## 2019-09-10 ENCOUNTER — Encounter: Payer: Self-pay | Admitting: Gastroenterology

## 2019-09-10 ENCOUNTER — Ambulatory Visit (INDEPENDENT_AMBULATORY_CARE_PROVIDER_SITE_OTHER): Payer: Medicare Other | Admitting: Gastroenterology

## 2019-09-10 VITALS — BP 156/80 | HR 76 | Ht 64.0 in | Wt 136.0 lb

## 2019-09-10 DIAGNOSIS — R109 Unspecified abdominal pain: Secondary | ICD-10-CM

## 2019-09-10 DIAGNOSIS — K862 Cyst of pancreas: Secondary | ICD-10-CM | POA: Diagnosis not present

## 2019-09-10 NOTE — Progress Notes (Signed)
Referring Provider: Vivi Barrack, MD Primary Care Physician:  Vivi Barrack, MD  Reason for Consultation:  Pancreatic cyst on CT   IMPRESSION:  Pancreatic cyst and dilated pancreatic duct on recent CT and MRI    - normal pancreas on CT in [facility] in 2019 Elevated lipase 07/2019 Recent abdominal pain, resolved with dietary changes Daily alcohol use, with history of heavier use Glucose intolerance Chronic constipation treated with Miralax and magnesium Adenomyomatosis of the gallbladder fundus. Prior screening colonoscopy in [facility]   Abnormal pancreas on imaging: EUS recommended for further evaluation. I will review her recent CT and MRI with one of our endoscopic ultrasonographers.   Abdominal pain: Symptoms may have been acute pancreatitis. Other etiologies must be considered.  Now resolved.   PLAN: Obtain CT from [facility] and scan into EPIC EGD with EUS Obtain colonoscopy with Dr. Julio Sicks in 2019 at [facility]  The nature of the procedure, as well as the risks, benefits, and alternatives were carefully and thoroughly reviewed with the patient. Ample time for discussion and questions allowed. The patient understood, was satisfied, and agreed to proceed.  Please see the "Patient Instructions" section for addition details about the plan.  I spent 60 minutes, including in depth chart review, independent review of results, communicating results with the patient directly, face-to-face time with the patient, coordinating care, ordering studies and medications as appropriate, and documentation.    HPI: Linda Knight is a 79 y.o. female referred by Dr. Jerline Pain for further evaluation of recent abdominal pain and abnormal pancreas imaging. The history is obtained through the patient, records that she brings to this appointment, and review of her electronic health record. She has anemia, arthritis, hypercholesterolemia, hypertension, thyroid  disease, and sleep apnea.  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   Longstanding history of stomach troubles and gluten intolerance.  She had a prior hysterectomy and "bladder repair" surgery.  She has a cystocele and rectocele with some vaginal vault prolapse.  Evaluated by urologist with abdominal pain and some trouble with emptying bladder.   Abdominal pain initially felt like heartburn but the pain became so severe she stopped eating. Abdominal pain resolved with dietary changes and bowel regimen to resolve constipation.   CT scan with and without contrast 07/05/19 showed no acute findings, pancreatic atrophy with focal distension of the main pancreatic duct to approximately 57mm. Peripheral pancreatic duct is nondilated. Subtle low attenuation in the head of the pancreas extending towards the atrophied uncinate measuring 74mm. Baseline desnity not clearly cystic but small.  Mild bilteral ureteral distension without obstructing lesion. Small kidney cysts.   MRI of the abdomen 08/10/19 showed small lesions in the pancreatic head show ductal communication. Main pancreat duct dilated to just at or above 66mm. Additional 31mm focus in the body of the pancreas. Adenomyomatosis of the gallbladder fundus.  Labs 05/03/19: normal liver enzymes Labs 07/17/19 lipase 111, amylase 72 Labs 08/15/19: normal liver enzymes  Symptoms resolved with changes in her diet. She changed her diet because she saw online foods to avoid with pancreas problems including alcohol, dairy, fried foods, greasy foods, spicy foods.  Appetite is good. No change in weight.   History of daily cocktail or glass of wine prior to this diagnosis. History of heavy drinking in the past - but apparently in the very distant  past. Does not quantitate volume.  Quit smoking over 30 years ago. Previously 1 PPD smoker.   Prior CT scan in  [place] 2019 reported "pancrease is unremarkable."  History of pancreatitis or pancreatic cancer.  No prior pancrease imaging. No family history of pancreatitis or pancreatitic cancer.   Longstanding history of constipation. Worsened by recent GYN. Uses Metamucil, magnesium powder, as needed.  No diarrhea. No blood or mucous.   Colonoscopy in 2019 in [place] with Dr. Julio Sicks. Report not available although the patient has photos of the cecum in her copies of medical records.   No known family history of colon cancer or polyps. No family history of uterine/endometrial cancer, ovarian cancer or gastric/stomach cancer.   Past Medical History:  Diagnosis Date  . Anemia   . Arthritis   . CAD (coronary artery disease)   . High cholesterol   . Hypertension   . Pancreatic cyst   . Sleep apnea   . Thyroid disease     Past Surgical History:  Procedure Laterality Date  . ABDOMINAL HYSTERECTOMY    . APPENDECTOMY    . BREAST EXCISIONAL BIOPSY Right   . BUNIONECTOMY    . CYSTOCELE REPAIR     bladder repair    Current Outpatient Medications  Medication Sig Dispense Refill  . amLODipine-benazepril (LOTREL) 5-10 MG capsule Take 1 capsule by mouth daily. 90 capsule 3  . bisacodyl (GENTLE LAXATIVE) 5 MG EC tablet Take 5 mg by mouth daily.     . Cholecalciferol (VITAMIN D3 PO) Take 400 Units by mouth.     . Coenzyme Q10 (CO Q 10 PO) Take 200 mg by mouth.    . conjugated estrogens (PREMARIN) vaginal cream Place 1 Applicatorful vaginally daily.    . diazepam (VALIUM) 5 MG tablet Take 0.5-1 tablets (2.5-5 mg total) by mouth every 12 (twelve) hours as needed for anxiety. 60 tablet 3  . estrogens, conjugated, (PREMARIN) 0.3 MG tablet Take 0.3 mg by mouth 3 (three) times a week. Take daily for 21 days then do not take for 7 days.     . Probiotic Product (PROBIOTIC-10 PO) Take 20 BAU by mouth.    . zolpidem (AMBIEN CR) 12.5 MG CR tablet Take 1 tablet (12.5 mg total) by mouth at bedtime as  needed for sleep. 30 tablet 5  . hydrochlorothiazide (HYDRODIURIL) 25 MG tablet Take 1 tablet (25 mg total) by mouth daily. Take 25 mg by mouth daily. 90 tablet 1  . levothyroxine (SYNTHROID) 75 MCG tablet Take 1 tablet (75 mcg total) by mouth daily before breakfast. Patient taking 0.5 tablet daily (Patient taking differently: Take 75 mcg by mouth daily before breakfast. ) 90 tablet 0   No current facility-administered medications for this visit.    Allergies as of 09/10/2019  . (No Known Allergies)    Family History  Problem Relation Age of Onset  . Hyperlipidemia Mother   . Hypertension Mother   . Stroke Mother   . Lung disease Father   . Heart disease Brother   . Colon cancer Neg Hx     Social History   Socioeconomic History  . Marital status: Married    Spouse name: Not on file  . Number of children: Not on file  . Years of education: Not on file  . Highest education level: Not on file  Occupational History  . Occupation: Retired   Tobacco Use  . Smoking status: Former Smoker    Quit date: 09/18/1980  Years since quitting: 39.0  . Smokeless tobacco: Never Used  Vaping Use  . Vaping Use: Never used  Substance and Sexual Activity  . Alcohol use: Not Currently  . Drug use: Never  . Sexual activity: Not Currently    Comment: 1st intercourse 79 yo-Fewer than 5 partners  Other Topics Concern  . Not on file  Social History Narrative   Moved from [facility] July 2020   Social Determinants of Health   Financial Resource Strain:   . Difficulty of Paying Living Expenses:   Food Insecurity:   . Worried About Charity fundraiser in the Last Year:   . Arboriculturist in the Last Year:   Transportation Needs:   . Film/video editor (Medical):   Marland Kitchen Lack of Transportation (Non-Medical):   Physical Activity:   . Days of Exercise per Week:   . Minutes of Exercise per Session:   Stress:   . Feeling of Stress :   Social Connections:   . Frequency of Communication  with Friends and Family:   . Frequency of Social Gatherings with Friends and Family:   . Attends Religious Services:   . Active Member of Clubs or Organizations:   . Attends Archivist Meetings:   Marland Kitchen Marital Status:   Intimate Partner Violence:   . Fear of Current or Ex-Partner:   . Emotionally Abused:   Marland Kitchen Physically Abused:   . Sexually Abused:     Review of Systems: 12 system ROS is negative except as noted above with allergies, arthritis, back pain, fatigue, hearing problems, insomnia.   Physical Exam: General:   Alert,  well-nourished, pleasant and cooperative in NAD Head:  Normocephalic and atraumatic. Eyes:  Sclera clear, no icterus.   Conjunctiva pink. Ears:  Normal auditory acuity. Nose:  No deformity, discharge,  or lesions. Mouth:  No deformity or lesions.   Neck:  Supple; no masses or thyromegaly. Lungs:  Clear throughout to auscultation.   No wheezes. Heart:  Regular rate and rhythm; no murmurs. Abdomen:  Soft,nontender, nondistended, normal bowel sounds, no rebound or guarding. No hepatosplenomegaly.   Rectal:  Deferred  Msk:  Symmetrical. No boney deformities LAD: No inguinal or umbilical LAD Extremities:  No clubbing or edema. Neurologic:  Alert and  oriented x4;  grossly nonfocal Skin:  Intact without significant lesions or rashes. Psych:  Alert and cooperative. Normal mood and affect.    Denicia Pagliarulo L. Tarri Glenn, MD, MPH 09/10/2019, 10:30 AM

## 2019-09-10 NOTE — H&P (View-Only) (Signed)
Referring Provider: Vivi Barrack, MD Primary Care Physician:  Vivi Barrack, MD  Reason for Consultation:  Pancreatic cyst on CT   IMPRESSION:  Pancreatic cyst and dilated pancreatic duct on recent CT and MRI    - normal pancreas on CT in [facility] in 2019 Elevated lipase 07/2019 Recent abdominal pain, resolved with dietary changes Daily alcohol use, with history of heavier use Glucose intolerance Chronic constipation treated with Miralax and magnesium Adenomyomatosis of the gallbladder fundus. Prior screening colonoscopy in [facility]   Abnormal pancreas on imaging: EUS recommended for further evaluation. I will review her recent CT and MRI with one of our endoscopic ultrasonographers.   Abdominal pain: Symptoms may have been acute pancreatitis. Other etiologies must be considered.  Now resolved.   PLAN: Obtain CT from [facility] and scan into EPIC EGD with EUS Obtain colonoscopy with Dr. Julio Sicks in 2019 at [facility]  The nature of the procedure, as well as the risks, benefits, and alternatives were carefully and thoroughly reviewed with the patient. Ample time for discussion and questions allowed. The patient understood, was satisfied, and agreed to proceed.  Please see the "Patient Instructions" section for addition details about the plan.  I spent 60 minutes, including in depth chart review, independent review of results, communicating results with the patient directly, face-to-face time with the patient, coordinating care, ordering studies and medications as appropriate, and documentation.    HPI: Linda Knight is a 79 y.o. female referred by Dr. Jerline Pain for further evaluation of recent abdominal pain and abnormal pancreas imaging. The history is obtained through the patient, records that she brings to this appointment, and review of her electronic health record. She has anemia, arthritis, hypercholesterolemia, hypertension, thyroid  disease, and sleep apnea.  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   Longstanding history of stomach troubles and gluten intolerance.  She had a prior hysterectomy and "bladder repair" surgery.  She has a cystocele and rectocele with some vaginal vault prolapse.  Evaluated by urologist with abdominal pain and some trouble with emptying bladder.   Abdominal pain initially felt like heartburn but the pain became so severe she stopped eating. Abdominal pain resolved with dietary changes and bowel regimen to resolve constipation.   CT scan with and without contrast 07/05/19 showed no acute findings, pancreatic atrophy with focal distension of the main pancreatic duct to approximately 54mm. Peripheral pancreatic duct is nondilated. Subtle low attenuation in the head of the pancreas extending towards the atrophied uncinate measuring 83mm. Baseline desnity not clearly cystic but small.  Mild bilteral ureteral distension without obstructing lesion. Small kidney cysts.   MRI of the abdomen 08/10/19 showed small lesions in the pancreatic head show ductal communication. Main pancreat duct dilated to just at or above 23mm. Additional 56mm focus in the body of the pancreas. Adenomyomatosis of the gallbladder fundus.  Labs 05/03/19: normal liver enzymes Labs 07/17/19 lipase 111, amylase 72 Labs 08/15/19: normal liver enzymes  Symptoms resolved with changes in her diet. She changed her diet because she saw online foods to avoid with pancreas problems including alcohol, dairy, fried foods, greasy foods, spicy foods.  Appetite is good. No change in weight.   History of daily cocktail or glass of wine prior to this diagnosis. History of heavy drinking in the past - but apparently in the very distant  past. Does not quantitate volume.  Quit smoking over 30 years ago. Previously 1 PPD smoker.   Prior CT scan in  [place] 2019 reported "pancrease is unremarkable."  History of pancreatitis or pancreatic cancer.  No prior pancrease imaging. No family history of pancreatitis or pancreatitic cancer.   Longstanding history of constipation. Worsened by recent GYN. Uses Metamucil, magnesium powder, as needed.  No diarrhea. No blood or mucous.   Colonoscopy in 2019 in [place] with Dr. Julio Sicks. Report not available although the patient has photos of the cecum in her copies of medical records.   No known family history of colon cancer or polyps. No family history of uterine/endometrial cancer, ovarian cancer or gastric/stomach cancer.   Past Medical History:  Diagnosis Date  . Anemia   . Arthritis   . CAD (coronary artery disease)   . High cholesterol   . Hypertension   . Pancreatic cyst   . Sleep apnea   . Thyroid disease     Past Surgical History:  Procedure Laterality Date  . ABDOMINAL HYSTERECTOMY    . APPENDECTOMY    . BREAST EXCISIONAL BIOPSY Right   . BUNIONECTOMY    . CYSTOCELE REPAIR     bladder repair    Current Outpatient Medications  Medication Sig Dispense Refill  . amLODipine-benazepril (LOTREL) 5-10 MG capsule Take 1 capsule by mouth daily. 90 capsule 3  . bisacodyl (GENTLE LAXATIVE) 5 MG EC tablet Take 5 mg by mouth daily.     . Cholecalciferol (VITAMIN D3 PO) Take 400 Units by mouth.     . Coenzyme Q10 (CO Q 10 PO) Take 200 mg by mouth.    . conjugated estrogens (PREMARIN) vaginal cream Place 1 Applicatorful vaginally daily.    . diazepam (VALIUM) 5 MG tablet Take 0.5-1 tablets (2.5-5 mg total) by mouth every 12 (twelve) hours as needed for anxiety. 60 tablet 3  . estrogens, conjugated, (PREMARIN) 0.3 MG tablet Take 0.3 mg by mouth 3 (three) times a week. Take daily for 21 days then do not take for 7 days.     . Probiotic Product (PROBIOTIC-10 PO) Take 20 BAU by mouth.    . zolpidem (AMBIEN CR) 12.5 MG CR tablet Take 1 tablet (12.5 mg total) by mouth at bedtime as  needed for sleep. 30 tablet 5  . hydrochlorothiazide (HYDRODIURIL) 25 MG tablet Take 1 tablet (25 mg total) by mouth daily. Take 25 mg by mouth daily. 90 tablet 1  . levothyroxine (SYNTHROID) 75 MCG tablet Take 1 tablet (75 mcg total) by mouth daily before breakfast. Patient taking 0.5 tablet daily (Patient taking differently: Take 75 mcg by mouth daily before breakfast. ) 90 tablet 0   No current facility-administered medications for this visit.    Allergies as of 09/10/2019  . (No Known Allergies)    Family History  Problem Relation Age of Onset  . Hyperlipidemia Mother   . Hypertension Mother   . Stroke Mother   . Lung disease Father   . Heart disease Brother   . Colon cancer Neg Hx     Social History   Socioeconomic History  . Marital status: Married    Spouse name: Not on file  . Number of children: Not on file  . Years of education: Not on file  . Highest education level: Not on file  Occupational History  . Occupation: Retired   Tobacco Use  . Smoking status: Former Smoker    Quit date: 09/18/1980  Years since quitting: 39.0  . Smokeless tobacco: Never Used  Vaping Use  . Vaping Use: Never used  Substance and Sexual Activity  . Alcohol use: Not Currently  . Drug use: Never  . Sexual activity: Not Currently    Comment: 1st intercourse 79 yo-Fewer than 5 partners  Other Topics Concern  . Not on file  Social History Narrative   Moved from [facility] July 2020   Social Determinants of Health   Financial Resource Strain:   . Difficulty of Paying Living Expenses:   Food Insecurity:   . Worried About Charity fundraiser in the Last Year:   . Arboriculturist in the Last Year:   Transportation Needs:   . Film/video editor (Medical):   Marland Kitchen Lack of Transportation (Non-Medical):   Physical Activity:   . Days of Exercise per Week:   . Minutes of Exercise per Session:   Stress:   . Feeling of Stress :   Social Connections:   . Frequency of Communication  with Friends and Family:   . Frequency of Social Gatherings with Friends and Family:   . Attends Religious Services:   . Active Member of Clubs or Organizations:   . Attends Archivist Meetings:   Marland Kitchen Marital Status:   Intimate Partner Violence:   . Fear of Current or Ex-Partner:   . Emotionally Abused:   Marland Kitchen Physically Abused:   . Sexually Abused:     Review of Systems: 12 system ROS is negative except as noted above with allergies, arthritis, back pain, fatigue, hearing problems, insomnia.   Physical Exam: General:   Alert,  well-nourished, pleasant and cooperative in NAD Head:  Normocephalic and atraumatic. Eyes:  Sclera clear, no icterus.   Conjunctiva pink. Ears:  Normal auditory acuity. Nose:  No deformity, discharge,  or lesions. Mouth:  No deformity or lesions.   Neck:  Supple; no masses or thyromegaly. Lungs:  Clear throughout to auscultation.   No wheezes. Heart:  Regular rate and rhythm; no murmurs. Abdomen:  Soft,nontender, nondistended, normal bowel sounds, no rebound or guarding. No hepatosplenomegaly.   Rectal:  Deferred  Msk:  Symmetrical. No boney deformities LAD: No inguinal or umbilical LAD Extremities:  No clubbing or edema. Neurologic:  Alert and  oriented x4;  grossly nonfocal Skin:  Intact without significant lesions or rashes. Psych:  Alert and cooperative. Normal mood and affect.    Alvena Kiernan L. Tarri Glenn, MD, MPH 09/10/2019, 10:30 AM

## 2019-09-10 NOTE — Patient Instructions (Addendum)
If you are age 78 or older, your body mass index should be between 23-30. Your Body mass index is 23.34 kg/m. If this is out of the aforementioned range listed, please consider follow up with your Primary Care Provider.  If you are age 36 or younger, your body mass index should be between 19-25. Your Body mass index is 23.34 kg/m. If this is out of the aformentioned range listed, please consider follow up with your Primary Care Provider.   We are sending for your Colonoscopy report from Dr Julio Sicks in [place]  I have recommended an upper endoscopy with endoscopic ultrasound for further evaluation of your abdominal pain and pancreas findings on the CT and MRI.  We will contact you to schedule this appointment.  Due to recent changes in healthcare laws, you may see the results of your imaging and laboratory studies on MyChart before your provider has had a chance to review them.  We understand that in some cases there may be results that are confusing or concerning to you. Not all laboratory results come back in the same time frame and the provider may be waiting for multiple results in order to interpret others.  Please give [place] 48 hours in order for your provider to thoroughly review all the results before contacting the office for clarification of your results.

## 2019-09-17 ENCOUNTER — Telehealth: Payer: Self-pay

## 2019-09-17 NOTE — Telephone Encounter (Signed)
-----   Message from Milus Banister, MD sent at 09/15/2019 11:08 AM EDT ----- Yes, I think that is a good idea.  She doesn't really meet AGA criteria for EUS because the cyst is so small however it does seem to be NEW and she's having abd pains so it may not be an 'incidental' finding.  I"ll have [facility] reach out to her.   Aaronjames Kelsay, She needs upper EUS with me, first avialable for abnormal pancreas.  Thanks  ----- Message ----- From: Thornton Park, MD Sent: 09/12/2019   2:51 PM EDT To: Milus Banister, MD  Patient had abnormal pancreas and PD on recent CT at [facility] and MRI in evaluation of abdominal pain, which has now resolved. Normal pancreas on CT in [place] in 2019.  Could I schedule EUS with you?   Thank you.

## 2019-09-18 NOTE — Telephone Encounter (Signed)
Left message on machine to call back  

## 2019-09-20 ENCOUNTER — Other Ambulatory Visit: Payer: Self-pay

## 2019-09-20 DIAGNOSIS — K862 Cyst of pancreas: Secondary | ICD-10-CM

## 2019-09-20 NOTE — Telephone Encounter (Signed)
EUS scheduled, pt instructed and medications reviewed.  Patient instructions sent to My Chart per pt request .  Patient to call with any questions or concerns.

## 2019-09-24 ENCOUNTER — Other Ambulatory Visit (HOSPITAL_COMMUNITY)
Admission: RE | Admit: 2019-09-24 | Discharge: 2019-09-24 | Disposition: A | Discharge status: Home / Self Care | Payer: Medicare Other | Source: Ambulatory Visit | Attending: Gastroenterology | Admitting: Gastroenterology

## 2019-09-24 DIAGNOSIS — Z20822 Contact with and (suspected) exposure to covid-19: Secondary | ICD-10-CM | POA: Diagnosis not present

## 2019-09-24 DIAGNOSIS — Z01812 Encounter for preprocedural laboratory examination: Secondary | ICD-10-CM | POA: Diagnosis present

## 2019-09-24 LAB — SARS CORONAVIRUS 2 (TAT 6-24 HRS): SARS Coronavirus 2: NEGATIVE

## 2019-09-26 NOTE — Anesthesia Preprocedure Evaluation (Addendum)
Anesthesia Evaluation  Patient identified by MRN, date of birth, ID band Patient awake    Reviewed: Allergy & Precautions, NPO status , Patient's Chart, lab work & pertinent test results  Airway Mallampati: I  TM Distance: >3 FB Neck ROM: Full    Dental no notable dental hx. (+) Edentulous Upper   Pulmonary former smoker,    Pulmonary exam normal breath sounds clear to auscultation       Cardiovascular hypertension, Pt. on medications Normal cardiovascular exam Rhythm:Regular Rate:Normal     Neuro/Psych negative neurological ROS  negative psych ROS   GI/Hepatic negative GI ROS, Neg liver ROS,   Endo/Other  Hypothyroidism   Renal/GU negative Renal ROS     Musculoskeletal   Abdominal   Peds  Hematology  (+) anemia ,   Anesthesia Other Findings   Reproductive/Obstetrics                            Anesthesia Physical Anesthesia Plan  ASA: III  Anesthesia Plan: MAC   Post-op Pain Management:    Induction:   PONV Risk Score and Plan: Treatment may vary due to age or medical condition  Airway Management Planned: Natural Airway  Additional Equipment: None  Intra-op Plan:   Post-operative Plan:   Informed Consent: I have reviewed the patients History and Physical, chart, labs and discussed the procedure including the risks, benefits and alternatives for the proposed anesthesia with the patient or authorized representative who has indicated his/her understanding and acceptance.     Dental advisory given  Plan Discussed with: CRNA  Anesthesia Plan Comments: (Upper endoscopic ultrasound  For pancreatic cusp)       Anesthesia Quick Evaluation

## 2019-09-27 ENCOUNTER — Encounter (HOSPITAL_COMMUNITY)
Admission: RE | Disposition: A | Discharge status: Home / Self Care | Payer: Self-pay | Source: Home / Self Care | Attending: Gastroenterology

## 2019-09-27 ENCOUNTER — Ambulatory Visit (HOSPITAL_COMMUNITY): Payer: Medicare Other | Admitting: Anesthesiology

## 2019-09-27 ENCOUNTER — Ambulatory Visit (HOSPITAL_COMMUNITY)
Admission: RE | Admit: 2019-09-27 | Discharge: 2019-09-27 | Disposition: A | Discharge status: Home / Self Care | Payer: Medicare Other | Attending: Gastroenterology | Admitting: Gastroenterology

## 2019-09-27 ENCOUNTER — Other Ambulatory Visit: Payer: Self-pay

## 2019-09-27 ENCOUNTER — Encounter (HOSPITAL_COMMUNITY): Payer: Self-pay | Admitting: Gastroenterology

## 2019-09-27 DIAGNOSIS — K8689 Other specified diseases of pancreas: Secondary | ICD-10-CM | POA: Diagnosis not present

## 2019-09-27 DIAGNOSIS — I10 Essential (primary) hypertension: Secondary | ICD-10-CM | POA: Diagnosis not present

## 2019-09-27 DIAGNOSIS — K5909 Other constipation: Secondary | ICD-10-CM | POA: Insufficient documentation

## 2019-09-27 DIAGNOSIS — Z8349 Family history of other endocrine, nutritional and metabolic diseases: Secondary | ICD-10-CM | POA: Diagnosis not present

## 2019-09-27 DIAGNOSIS — Z9049 Acquired absence of other specified parts of digestive tract: Secondary | ICD-10-CM | POA: Insufficient documentation

## 2019-09-27 DIAGNOSIS — Z8249 Family history of ischemic heart disease and other diseases of the circulatory system: Secondary | ICD-10-CM | POA: Insufficient documentation

## 2019-09-27 DIAGNOSIS — F419 Anxiety disorder, unspecified: Secondary | ICD-10-CM | POA: Insufficient documentation

## 2019-09-27 DIAGNOSIS — K5710 Diverticulosis of small intestine without perforation or abscess without bleeding: Secondary | ICD-10-CM | POA: Diagnosis not present

## 2019-09-27 DIAGNOSIS — M1990 Unspecified osteoarthritis, unspecified site: Secondary | ICD-10-CM | POA: Insufficient documentation

## 2019-09-27 DIAGNOSIS — E079 Disorder of thyroid, unspecified: Secondary | ICD-10-CM | POA: Diagnosis not present

## 2019-09-27 DIAGNOSIS — G4730 Sleep apnea, unspecified: Secondary | ICD-10-CM | POA: Insufficient documentation

## 2019-09-27 DIAGNOSIS — N816 Rectocele: Secondary | ICD-10-CM | POA: Diagnosis not present

## 2019-09-27 DIAGNOSIS — Z79890 Hormone replacement therapy (postmenopausal): Secondary | ICD-10-CM | POA: Diagnosis not present

## 2019-09-27 DIAGNOSIS — K862 Cyst of pancreas: Secondary | ICD-10-CM | POA: Diagnosis not present

## 2019-09-27 DIAGNOSIS — K2970 Gastritis, unspecified, without bleeding: Secondary | ICD-10-CM | POA: Diagnosis not present

## 2019-09-27 DIAGNOSIS — I2510 Atherosclerotic heart disease of native coronary artery without angina pectoris: Secondary | ICD-10-CM | POA: Insufficient documentation

## 2019-09-27 DIAGNOSIS — Z87891 Personal history of nicotine dependence: Secondary | ICD-10-CM | POA: Diagnosis not present

## 2019-09-27 DIAGNOSIS — Z79899 Other long term (current) drug therapy: Secondary | ICD-10-CM | POA: Diagnosis not present

## 2019-09-27 DIAGNOSIS — R109 Unspecified abdominal pain: Secondary | ICD-10-CM | POA: Diagnosis present

## 2019-09-27 HISTORY — PX: EUS: SHX5427

## 2019-09-27 HISTORY — PX: BIOPSY: SHX5522

## 2019-09-27 HISTORY — PX: ESOPHAGOGASTRODUODENOSCOPY (EGD) WITH PROPOFOL: SHX5813

## 2019-09-27 LAB — GLUCOSE, CAPILLARY: Glucose-Capillary: 101 mg/dL — ABNORMAL HIGH (ref 70–99)

## 2019-09-27 SURGERY — UPPER ENDOSCOPIC ULTRASOUND (EUS) RADIAL
Anesthesia: Monitor Anesthesia Care

## 2019-09-27 MED ORDER — SODIUM CHLORIDE 0.9 % IV SOLN: INTRAVENOUS | Status: DC

## 2019-09-27 MED ORDER — LACTATED RINGERS IV SOLN
INTRAVENOUS | Status: DC
  Administered 2019-09-27: 1000 mL via INTRAVENOUS

## 2019-09-27 MED ORDER — GLYCOPYRROLATE 0.2 MG/ML IJ SOLN
INTRAMUSCULAR | Status: DC | PRN
  Administered 2019-09-27: .1 mg via INTRAVENOUS

## 2019-09-27 MED ORDER — PROPOFOL 10 MG/ML IV BOLUS
INTRAVENOUS | Status: DC | PRN
  Administered 2019-09-27: 250 ug/kg/min via INTRAVENOUS

## 2019-09-27 MED ORDER — LIDOCAINE HCL (CARDIAC) PF 100 MG/5ML IV SOSY
PREFILLED_SYRINGE | INTRAVENOUS | Status: DC | PRN
  Administered 2019-09-27: 50 mg via INTRAVENOUS

## 2019-09-27 NOTE — Anesthesia Postprocedure Evaluation (Signed)
Anesthesia Post Note  Patient: Linda Knight  Procedure(s) Performed: UPPER ENDOSCOPIC ULTRASOUND (EUS) RADIAL (N/A ) BIOPSY     Patient location during evaluation: Endoscopy Anesthesia Type: MAC Level of consciousness: awake and alert Pain management: pain level controlled Vital Signs Assessment: post-procedure vital signs reviewed and stable Respiratory status: spontaneous breathing, nonlabored ventilation, respiratory function stable and patient connected to nasal cannula oxygen Cardiovascular status: blood pressure returned to baseline and stable Postop Assessment: no apparent nausea or vomiting Anesthetic complications: no   No complications documented.  Last Vitals:  Vitals:   09/27/19 1000 09/27/19 1010  BP: 128/65 (!) 147/68  Pulse: (!) 57 62  Resp: 13 15  Temp:    SpO2: 99% 99%    Last Pain:  Vitals:   09/27/19 1010  TempSrc:   PainSc: 0-No pain                 Barnet Glasgow

## 2019-09-27 NOTE — Transfer of Care (Signed)
Immediate Anesthesia Transfer of Care Note  Patient: Linda Knight  Procedure(s) Performed: UPPER ENDOSCOPIC ULTRASOUND (EUS) RADIAL (N/A ) BIOPSY  Patient Location: PACU  Anesthesia Type:MAC  Level of Consciousness: awake, alert , oriented and patient cooperative  Airway & Oxygen Therapy: Patient Spontanous Breathing and Patient connected to face mask oxygen  Post-op Assessment: Report given to RN, Post -op Vital signs reviewed and stable and Patient moving all extremities X 4  Post vital signs: stable  Last Vitals:  Vitals Value Taken Time  BP    Temp    Pulse    Resp    SpO2      Last Pain:  Vitals:   09/27/19 0907  TempSrc: Oral  PainSc: 0-No pain         Complications: No complications documented.

## 2019-09-27 NOTE — Anesthesia Procedure Notes (Signed)
Procedure Name: MAC Date/Time: 09/27/2019 9:17 AM Performed by: Lissa Morales, CRNA Pre-anesthesia Checklist: Patient identified, Emergency Drugs available, Suction available, Patient being monitored and Timeout performed Patient Re-evaluated:Patient Re-evaluated prior to induction Oxygen Delivery Method: Simple face mask Placement Confirmation: positive ETCO2

## 2019-09-27 NOTE — Op Note (Signed)
[facility] Patient Name: Linda Knight Procedure Date: 09/27/2019 MRN: [id number] Attending MD: Milus Banister , MD Date of Birth: [birth year] CSN: [id number] Age: 79 Admit Type: Ambulatory Procedure:                Upper EUS Indications:              Abdominal pain which improved with dietary changes;                            slightly elevated lipase, MRI showed "Small lesions                            in the pancreatic head show ductal communication.                            Main pancreatic duct dilated to just at or just                            above 5 mm. Additional 6 mm focus in the body of                            the pancreas." No previous pancreatic disease. No                            etoh abuse. No FH of pancreatic disease. Initial                            weight loss 2 months ago has stabilized. Providers:                Milus Banister, MD, Baird Cancer, RN, Fransico Setters                            Mbumina, Technician Referring MD:             Thornton Park, MD Medicines:                Monitored Anesthesia Care Complications:            No immediate complications. Estimated blood loss:                            None. Estimated Blood Loss:     Estimated blood loss: none. Procedure:                Pre-Anesthesia Assessment:                           - Prior to the procedure, a History and Physical                            was performed, and patient medications and                            allergies were reviewed. The patient's tolerance of  previous anesthesia was also reviewed. The risks                            and benefits of the procedure and the sedation                            options and risks were discussed with the patient.                            All questions were answered, and informed consent                            was obtained. Prior Anticoagulants: The patient has                             taken no previous anticoagulant or antiplatelet                            agents. ASA Grade Assessment: II - A patient with                            mild systemic disease. After reviewing the risks                            and benefits, the patient was deemed in                            satisfactory condition to undergo the procedure.                           After obtaining informed consent, the endoscope was                            passed under direct vision. Throughout the                            procedure, the patient's blood pressure, pulse, and                            oxygen saturations were monitored continuously. The                            [id number]-[id number] ([id number]) Olympus Radial EUS was                            introduced through the mouth, and advanced to the                            second part of duodenum. The [id number] ([id number])                            Olympus gastroscope was introduced through the  mouth, and advanced to the second part of duodenum.                            The upper EUS was accomplished without difficulty.                            The patient tolerated the procedure well. Scope In: Scope Out: Findings:      ENDOSCOPIC FINDING: :      Normal esophagus      Mild inflammation characterized by erythema, friability and granularity       was found in the gastric antrum. Biopsies were taken with a cold forceps       for histology.      Medium sized periampullary duodenal diverticulum.      ENDOSONOGRAPHIC FINDING: :      1. The pancreatic parenchyma was somewhat atrophic throughout. There       were two small anechoic cysts (body and head) that each measured around       5-2mm. The cysts had no associated solid lesion or mural nodules. I       could not tell if the cysts communicated with the main pancreatic duct       or any side branches. There were no solid lesions in the pancreas.      2. Normal,  non-dilated main pancreatic duct.      3. No peripancreatic adenopathy.      4. CBD was normal, non-dilated and contained no stones.      5. Limited views of the liver, spleen, portal and splenic vessels were       all normal. Impression:               - Mild, non-specific gastritis. Biopsied to check                            for H. pylori.                           - Two small, innocent appearing pancreatic cysts.                           - Periampullary duodenal diverticulum. Moderate Sedation:      Not Applicable - Patient had care per Anesthesia. Recommendation:           - Discharge patient to home (ambulatory).                           - Await path from stomach biopsies.                           - Repeat MRI of the pancreas 08/2020. Procedure Code(s):        --- Professional ---                           [phone], Esophagogastroduodenoscopy, flexible,                            transoral; with endoscopic ultrasound examination  limited to the esophagus, stomach or duodenum, and                            adjacent structures                           43239, Esophagogastroduodenoscopy, flexible,                            transoral; with biopsy, single or multiple Diagnosis Code(s):        --- Professional ---                           K29.70, Gastritis, unspecified, without bleeding                           K86.2, Cyst of pancreas CPT copyright 2019 [employer]. All rights reserved. The codes documented in this report are preliminary and upon coder review may  be revised to meet current compliance requirements. Milus Banister, MD 09/27/2019 9:54:10 AM This report has been signed electronically. Number of Addenda: 0

## 2019-09-27 NOTE — Discharge Instructions (Signed)
YOU HAD AN ENDOSCOPIC PROCEDURE TODAY: Refer to the procedure report and other information in the discharge instructions given to you for any specific questions about what was found during the examination. If this information does not answer your questions, please call [place] office at [phone] to clarify.   YOU SHOULD EXPECT: Some feelings of bloating in the abdomen. Passage of more gas than usual. Walking can help get rid of the air that was put into your GI tract during the procedure and reduce the bloating. If you had a lower endoscopy (such as a colonoscopy or flexible sigmoidoscopy) you may notice spotting of blood in your stool or on the toilet paper. Some abdominal soreness may be present for a day or two, also.  DIET: Your first meal following the procedure should be a light meal and then it is ok to progress to your normal diet. A half-sandwich or bowl of soup is an example of a good first meal. Heavy or fried foods are harder to digest and may make you feel nauseous or bloated. Drink plenty of fluids but you should avoid alcoholic beverages for 24 hours. If you had a esophageal dilation, please see attached instructions for diet.    ACTIVITY: Your care partner should take you home directly after the procedure. You should plan to take it easy, moving slowly for the rest of the day. You can resume normal activity the day after the procedure however YOU SHOULD NOT DRIVE, use power tools, machinery or perform tasks that involve climbing or major physical exertion for 24 hours (because of the sedation medicines used during the test).   SYMPTOMS TO REPORT IMMEDIATELY: A gastroenterologist can be reached at any hour. Please call [phone]  for any of the following symptoms:  . Following upper endoscopy (EGD, EUS, ERCP, esophageal dilation) Vomiting of blood or coffee ground material  New, significant abdominal pain  New, significant chest pain or pain under the shoulder blades  Painful or  persistently difficult swallowing  New shortness of breath  Black, tarry-looking or red, bloody stools  FOLLOW UP:  If any biopsies were taken you will be contacted by phone or by letter within the next 1-3 weeks. Call [phone]  if you have not heard about the biopsies in 3 weeks.  Please also call with any specific questions about appointments or follow up tests.YOU HAD AN ENDOSCOPIC PROCEDURE TODAY: Refer to the procedure report and other information in the discharge instructions given to you for any specific questions about what was found during the examination. If this information does not answer your questions, please call [place] office at [phone] to clarify.   YOU SHOULD EXPECT: Some feelings of bloating in the abdomen. Passage of more gas than usual. Walking can help get rid of the air that was put into your GI tract during the procedure and reduce the bloating. If you had a lower endoscopy (such as a colonoscopy or flexible sigmoidoscopy) you may notice spotting of blood in your stool or on the toilet paper. Some abdominal soreness may be present for a day or two, also.  DIET: Your first meal following the procedure should be a light meal and then it is ok to progress to your normal diet. A half-sandwich or bowl of soup is an example of a good first meal. Heavy or fried foods are harder to digest and may make you feel nauseous or bloated. Drink plenty of fluids but you should avoid alcoholic beverages for 24 hours. If you had   a esophageal dilation, please see attached instructions for diet.    ACTIVITY: Your care partner should take you home directly after the procedure. You should plan to take it easy, moving slowly for the rest of the day. You can resume normal activity the day after the procedure however YOU SHOULD NOT DRIVE, use power tools, machinery or perform tasks that involve climbing or major physical exertion for 24 hours (because of the sedation medicines used during the  test).   SYMPTOMS TO REPORT IMMEDIATELY: A gastroenterologist can be reached at any hour. Please call [phone]  for any of the following symptoms:  . Following lower endoscopy (colonoscopy, flexible sigmoidoscopy) Excessive amounts of blood in the stool  Significant tenderness, worsening of abdominal pains  Swelling of the abdomen that is new, acute  Fever of 100 or higher  . Following upper endoscopy (EGD, EUS, ERCP, esophageal dilation) Vomiting of blood or coffee ground material  New, significant abdominal pain  New, significant chest pain or pain under the shoulder blades  Painful or persistently difficult swallowing  New shortness of breath  Black, tarry-looking or red, bloody stools  FOLLOW UP:  If any biopsies were taken you will be contacted by phone or by letter within the next 1-3 weeks. Call [phone]  if you have not heard about the biopsies in 3 weeks.  Please also call with any specific questions about appointments or follow up tests. 

## 2019-09-27 NOTE — Interval H&P Note (Signed)
History and Physical Interval Note:  09/27/2019 8:56 AM  Linda Knight  has presented today for surgery, with the diagnosis of pancreatic cust.  The various methods of treatment have been discussed with the patient and family. After consideration of risks, benefits and other options for treatment, the patient has consented to  Procedure(s): UPPER ENDOSCOPIC ULTRASOUND (EUS) RADIAL (N/A) as a surgical intervention.  The patient's history has been reviewed, patient examined, no change in status, stable for surgery.  I have reviewed the patient's chart and labs.  Questions were answered to the patient's satisfaction.     Milus Banister

## 2019-09-28 ENCOUNTER — Other Ambulatory Visit: Payer: Self-pay

## 2019-09-28 ENCOUNTER — Encounter (HOSPITAL_COMMUNITY): Payer: Self-pay | Admitting: Gastroenterology

## 2019-09-28 LAB — SURGICAL PATHOLOGY

## 2019-10-01 ENCOUNTER — Telehealth: Payer: Self-pay

## 2019-10-01 NOTE — Telephone Encounter (Signed)
Recall entered in epic for MRI pancreas in 1 year.

## 2019-10-01 NOTE — Telephone Encounter (Signed)
-----   Message from Thornton Park, MD sent at 09/28/2019  9:15 AM EDT ----- Please arrange for CT of the pancreas in one year. Follow-up office with me this fall if she has ongoing symptoms after her EUS.  Thanks.  KLB ----- Message ----- From: Milus Banister, MD Sent: 09/27/2019   9:55 AM EDT To: Thornton Park, MD  Joelene Millin, Just completed her EUS, see full report in epic. Thanks.  I think repeating imaging of her pancreas with MRI in 12 months is reasonable.    DJ    - Mild, non-specific gastritis. Biopsied to check for H. pylori. - Two small, innocent appearing pancreatic cysts. - Periampullary duodenal diverticulum.

## 2019-10-09 ENCOUNTER — Other Ambulatory Visit: Payer: Self-pay | Admitting: Family Medicine

## 2019-10-09 NOTE — Telephone Encounter (Signed)
  LAST APPOINTMENT DATE: 07/19/2019   NEXT APPOINTMENT DATE:@Visit  date not found  MEDICATION: hydrochlorothiazide (HYDRODIURIL) 25 MG tablet(Expired) // levothyroxine (SYNTHROID) 75 MCG tablet(Expired) // atorvastatin (LIPITOR) 20 MG tablet  PHARMACY: [place], [place] - [address]  Comments: Patient is completely out of Lipitor.   Please advise

## 2019-10-10 MED ORDER — ATORVASTATIN CALCIUM 20 MG PO TABS
20.0000 mg | ORAL_TABLET | Freq: Every day | ORAL | 2 refills | Status: DC
Start: 2019-10-10 — End: 2021-12-01

## 2019-11-14 ENCOUNTER — Other Ambulatory Visit: Payer: Self-pay

## 2019-11-14 ENCOUNTER — Encounter: Payer: Self-pay | Admitting: Family Medicine

## 2019-11-14 ENCOUNTER — Ambulatory Visit (INDEPENDENT_AMBULATORY_CARE_PROVIDER_SITE_OTHER): Payer: Medicare Other | Admitting: Family Medicine

## 2019-11-14 VITALS — BP 145/80 | HR 72 | Temp 97.4°F | Ht 64.0 in | Wt 130.6 lb

## 2019-11-14 DIAGNOSIS — E785 Hyperlipidemia, unspecified: Secondary | ICD-10-CM

## 2019-11-14 DIAGNOSIS — E039 Hypothyroidism, unspecified: Secondary | ICD-10-CM

## 2019-11-14 DIAGNOSIS — Z85820 Personal history of malignant melanoma of skin: Secondary | ICD-10-CM | POA: Diagnosis not present

## 2019-11-14 DIAGNOSIS — Z23 Encounter for immunization: Secondary | ICD-10-CM

## 2019-11-14 DIAGNOSIS — I6529 Occlusion and stenosis of unspecified carotid artery: Secondary | ICD-10-CM

## 2019-11-14 DIAGNOSIS — I10 Essential (primary) hypertension: Secondary | ICD-10-CM

## 2019-11-14 MED ORDER — HYDROCHLOROTHIAZIDE 25 MG PO TABS
12.5000 mg | ORAL_TABLET | Freq: Every day | ORAL | 3 refills | Status: DC
Start: 2019-11-14 — End: 2021-02-18

## 2019-11-14 NOTE — Assessment & Plan Note (Addendum)
Check TSH. Continue levothyroxine 30mcg daily for now. Will place referral to endocrinology per patient request.

## 2019-11-14 NOTE — Patient Instructions (Signed)
It was very nice to see you today!  I will place a referral for you to see endocrinology and dermatology.  We will check blood work today.  Please decrease your HCTZ to 12.5 mg daily.  I will also place an order for you to get an ultrasound of your neck.  Please send me a message in a couple weeks to let me know how your blood pressures are looking.  Take care, Dr Jerline Pain  Please try these tips to maintain a healthy lifestyle:   Eat at least 3 REAL meals and 1-2 snacks per day.  Aim for no more than 5 hours between eating.  If you eat breakfast, please do so within one hour of getting up.    Each meal should contain half fruits/vegetables, one quarter protein, and one quarter carbs (no bigger than a computer mouse)   Cut down on sweet beverages. This includes juice, soda, and sweet tea.     Drink at least 1 glass of water with each meal and aim for at least 8 glasses per day   Exercise at least 150 minutes every week.

## 2019-11-14 NOTE — Assessment & Plan Note (Addendum)
Has had some symptomatic lows into 90s/60s. Will decrease HCTZ to 12.5mg  daily. Continue amlodipine-benazepril 5-10mg  daily. Continue home monitoring goal  150/90 or lower. Will follow up with me in mychart in a few weeks.

## 2019-11-14 NOTE — Assessment & Plan Note (Signed)
Did not get scan last year. Will place order for dopplers.

## 2019-11-14 NOTE — Assessment & Plan Note (Signed)
Check lipid panel today 

## 2019-11-14 NOTE — Progress Notes (Signed)
   Linda Knight is a 79 y.o. female who presents today for an office visit.  Assessment/Plan:  Chronic Problems Addressed Today: Hypothyroidism Check TSH. Continue levothyroxine 30mcg daily for now. Will place referral to endocrinology per patient request.   Dyslipidemia Check lipid panel today.   Essential hypertension Has had some symptomatic lows into 90s/60s. Will decrease HCTZ to 12.5mg  daily. Continue amlodipine-benazepril 5-10mg  daily. Continue home monitoring goal  150/90 or lower. Will follow up with me in mychart in a few weeks.   History of melanoma Will place referral to dermatology.   Stenosis of carotid artery Did not get scan last year. Will place order for dopplers.   Flu vaccine given today.     Subjective:  HPI:  See A/p.         Objective:  Physical Exam: BP (!) 145/80   Pulse 72   Temp (!) 97.4 F (36.3 C) (Temporal)   Ht 5\' 4"  (1.626 m)   Wt 130 lb 9.6 oz (59.2 kg)   SpO2 98%   BMI 22.42 kg/m   Gen: No acute distress, resting comfortably CV: Regular rate and rhythm with no murmurs appreciated. No carotid bruits.  Pulm: Normal work of breathing, clear to auscultation bilaterally with no crackles, wheezes, or rhonchi Neuro: Grossly normal, moves all extremities Psych: Normal affect and thought content      Linda Knight M. Jerline Pain, MD 11/14/2019 8:35 AM

## 2019-11-14 NOTE — Assessment & Plan Note (Signed)
Will place referral to dermatology. . °

## 2019-11-15 ENCOUNTER — Other Ambulatory Visit: Payer: Self-pay

## 2019-11-15 DIAGNOSIS — E039 Hypothyroidism, unspecified: Secondary | ICD-10-CM

## 2019-11-15 LAB — TSH: TSH: 0.08 mIU/L — ABNORMAL LOW (ref 0.40–4.50)

## 2019-11-15 LAB — CBC
HCT: 29.9 % — ABNORMAL LOW (ref 35.0–45.0)
Hemoglobin: 10.5 g/dL — ABNORMAL LOW (ref 11.7–15.5)
MCH: 35 pg — ABNORMAL HIGH (ref 27.0–33.0)
MCHC: 35.1 g/dL (ref 32.0–36.0)
MCV: 99.7 fL (ref 80.0–100.0)
MPV: 10 fL (ref 7.5–12.5)
Platelets: 268 10*3/uL (ref 140–400)
RBC: 3 10*6/uL — ABNORMAL LOW (ref 3.80–5.10)
RDW: 11.7 % (ref 11.0–15.0)
WBC: 4.2 10*3/uL (ref 3.8–10.8)

## 2019-11-15 LAB — LIPID PANEL
Cholesterol: 176 mg/dL (ref <200)
HDL: 82 mg/dL (ref >=50)
LDL Cholesterol (Calc): 74 mg/dL (calc)
Non-HDL Cholesterol (Calc): 94 mg/dL (calc) (ref <130)
Total CHOL/HDL Ratio: 2.1 (calc) (ref <5.0)
Triglycerides: 112 mg/dL (ref <150)

## 2019-11-15 LAB — COMPREHENSIVE METABOLIC PANEL
AG Ratio: 1.6 (calc) (ref 1.0–2.5)
ALT: 21 U/L (ref 6–29)
AST: 24 U/L (ref 10–35)
Albumin: 4.1 g/dL (ref 3.6–5.1)
Alkaline phosphatase (APISO): 61 U/L (ref 37–153)
BUN/Creatinine Ratio: 11 (calc) (ref 6–22)
BUN: 10 mg/dL (ref 7–25)
CO2: 30 mmol/L (ref 20–32)
Calcium: 9.5 mg/dL (ref 8.6–10.4)
Chloride: 95 mmol/L — ABNORMAL LOW (ref 98–110)
Creat: 0.94 mg/dL — ABNORMAL HIGH (ref 0.60–0.93)
Globulin: 2.6 g/dL (calc) (ref 1.9–3.7)
Glucose, Bld: 94 mg/dL (ref 65–99)
Potassium: 3.3 mmol/L — ABNORMAL LOW (ref 3.5–5.3)
Sodium: 134 mmol/L — ABNORMAL LOW (ref 135–146)
Total Bilirubin: 0.5 mg/dL (ref 0.2–1.2)
Total Protein: 6.7 g/dL (ref 6.1–8.1)

## 2019-11-15 MED ORDER — LEVOTHYROXINE SODIUM 50 MCG PO TABS: 50.0000 ug | ORAL_TABLET | Freq: Every day | ORAL | 3 refills | Status: DC

## 2019-11-15 NOTE — Progress Notes (Signed)
Please inform patient of the following:  Her thyroid level is off. Everything else is stable. Recommend decreasing dose of synthroid to 64mcg daily and rechecking in 4-6 weeks. She should be hearing about the endocrinology referral soon if she has not already.  Algis Greenhouse. Jerline Pain, MD 11/15/2019 12:53 PM

## 2019-11-16 ENCOUNTER — Other Ambulatory Visit: Payer: Self-pay | Admitting: *Deleted

## 2019-11-16 DIAGNOSIS — E039 Hypothyroidism, unspecified: Secondary | ICD-10-CM

## 2019-11-16 MED ORDER — LEVOTHYROXINE SODIUM 50 MCG PO TABS: 50.0000 ug | ORAL_TABLET | Freq: Every day | ORAL | 0 refills | Status: DC

## 2019-11-16 NOTE — Progress Notes (Signed)
t

## 2019-11-22 ENCOUNTER — Telehealth: Payer: Self-pay

## 2019-11-22 ENCOUNTER — Other Ambulatory Visit: Payer: Self-pay

## 2019-11-22 DIAGNOSIS — D649 Anemia, unspecified: Secondary | ICD-10-CM

## 2019-11-22 DIAGNOSIS — E538 Deficiency of other specified B group vitamins: Secondary | ICD-10-CM

## 2019-11-22 NOTE — Telephone Encounter (Signed)
Her blood counts are low. We have not checked her iron levels. HEr blood counts are probably LOW because of her B12.  Recommend she come back in for labs including CBC, B12, ferritin, iron, and TIBC before we give her an iron infusion to make sure she would benefit from it.  Algis Greenhouse. Jerline Pain, MD 11/22/2019 4:06 PM

## 2019-11-22 NOTE — Telephone Encounter (Signed)
Patient is requesting a IRON INFUSION before her surgery on the 29th of October can we arrange this?  anesthesiology department told her with her iron level she needed to get an infusion before the surgery?

## 2019-11-22 NOTE — Telephone Encounter (Signed)
Called and spoke with pt, gave below message, future labs ordred and lab appointment made.

## 2019-11-22 NOTE — Telephone Encounter (Signed)
See below

## 2019-11-23 ENCOUNTER — Other Ambulatory Visit: Payer: Self-pay

## 2019-11-23 ENCOUNTER — Other Ambulatory Visit: Payer: Self-pay | Admitting: *Deleted

## 2019-11-23 ENCOUNTER — Other Ambulatory Visit: Payer: Medicare Other

## 2019-11-23 DIAGNOSIS — D649 Anemia, unspecified: Secondary | ICD-10-CM

## 2019-11-23 DIAGNOSIS — E538 Deficiency of other specified B group vitamins: Secondary | ICD-10-CM

## 2019-11-24 LAB — CBC WITH DIFFERENTIAL/PLATELET
Absolute Monocytes: 404 cells/uL (ref 200–950)
Basophils Absolute: 22 cells/uL (ref 0–200)
Basophils Relative: 0.5 %
Eosinophils Absolute: 99 cells/uL (ref 15–500)
Eosinophils Relative: 2.3 %
HCT: 31 % — ABNORMAL LOW (ref 35.0–45.0)
Hemoglobin: 10.6 g/dL — ABNORMAL LOW (ref 11.7–15.5)
Lymphs Abs: 1462 cells/uL (ref 850–3900)
MCH: 34.1 pg — ABNORMAL HIGH (ref 27.0–33.0)
MCHC: 34.2 g/dL (ref 32.0–36.0)
MCV: 99.7 fL (ref 80.0–100.0)
MPV: 9.8 fL (ref 7.5–12.5)
Monocytes Relative: 9.4 %
Neutro Abs: 2313 cells/uL (ref 1500–7800)
Neutrophils Relative %: 53.8 %
Platelets: 257 10*3/uL (ref 140–400)
RBC: 3.11 10*6/uL — ABNORMAL LOW (ref 3.80–5.10)
RDW: 11.6 % (ref 11.0–15.0)
Total Lymphocyte: 34 %
WBC: 4.3 10*3/uL (ref 3.8–10.8)

## 2019-11-24 LAB — VITAMIN B12: Vitamin B-12: 523 pg/mL (ref 200–1100)

## 2019-11-24 LAB — IRON,TIBC AND FERRITIN PANEL
%SAT: 21 % (calc) (ref 16–45)
Ferritin: 108 ng/mL (ref 16–288)
Iron: 62 ug/dL (ref 45–160)
TIBC: 300 mcg/dL (calc) (ref 250–450)

## 2019-11-27 ENCOUNTER — Other Ambulatory Visit: Payer: Self-pay

## 2019-11-27 ENCOUNTER — Ambulatory Visit (INDEPENDENT_AMBULATORY_CARE_PROVIDER_SITE_OTHER): Payer: Medicare Other | Admitting: Internal Medicine

## 2019-11-27 ENCOUNTER — Encounter: Payer: Self-pay | Admitting: Internal Medicine

## 2019-11-27 VITALS — BP 148/82 | HR 78 | Ht 64.0 in | Wt 131.0 lb

## 2019-11-27 DIAGNOSIS — E039 Hypothyroidism, unspecified: Secondary | ICD-10-CM

## 2019-11-27 DIAGNOSIS — D649 Anemia, unspecified: Secondary | ICD-10-CM

## 2019-11-27 DIAGNOSIS — E538 Deficiency of other specified B group vitamins: Secondary | ICD-10-CM

## 2019-11-27 MED ORDER — VITAMIN D (CHOLECALCIFEROL) 25 MCG (1000 UT) PO CAPS: 1.0000 | ORAL_CAPSULE | Freq: Every day | ORAL | 3 refills | Status: DC

## 2019-11-27 NOTE — Progress Notes (Signed)
Name: Linda Knight  MRN/ DOB: [id number], May 23, 1940    Age/ Sex: 79 y.o., female    PCP: Vivi Barrack, MD   Reason for Endocrinology Evaluation: Hypothyroidism     Date of Initial Endocrinology Evaluation: 11/27/2019     HPI: [name] is a 79 y.o. female with a past medical history of carotid artery stenosis, HTN and hypothyroidism. The patient presented for initial endocrinology clinic visit on 11/27/2019 for consultative assistance with her hypothyroidism.   Pt has been diagnosed with hypothyroidism many years and has been on LT-4 replacement since her diagnosis. She has been on 75 mcg until 18 months ago requiring 37.5 mcg daily   In July, 2021 she has been noted with low TSH with a nadir of 0.08 uIU/mL , currently on Levothyroxine 50 mcg daily just 2 weeks ago.   She has noted weight loss  Is having bladder sx next Friday for vaginal prolapse  Earlier in the year was diagnosed with pancreatic cyst  Denies diarrhea Has been anxious  Denies tremors  Separates vitamins in the afternoon   Does not take Biotin    Denies radiation to the neck area  Denies local neck symptoms   No known FH of thyroid disease     HISTORY:  Past Medical History:  Past Medical History:  Diagnosis Date   Anemia    Arthritis    CAD (coronary artery disease)    High cholesterol    Hypertension    Pancreatic cyst    Sleep apnea    Thyroid disease    Past Surgical History:  Past Surgical History:  Procedure Laterality Date   ABDOMINAL HYSTERECTOMY     APPENDECTOMY     BIOPSY  09/27/2019   Procedure: BIOPSY;  Surgeon: Milus Banister, MD;  [facility]: [facility];  Service: Endoscopy;;   BREAST EXCISIONAL BIOPSY Right    BUNIONECTOMY     CYSTOCELE REPAIR     bladder repair   ESOPHAGOGASTRODUODENOSCOPY (EGD) WITH PROPOFOL N/A 09/27/2019   Procedure: ESOPHAGOGASTRODUODENOSCOPY (EGD) WITH PROPOFOL;  Surgeon: Milus Banister, MD;  [facility];  Service: Endoscopy;  Laterality: N/A;   EUS N/A 09/27/2019   Procedure: UPPER ENDOSCOPIC ULTRASOUND (EUS) RADIAL;  Surgeon: Milus Banister, MD;  [facility];  Service: Endoscopy;  Laterality: N/A;      Social History:  reports that she quit smoking about 39 years ago. She has never used smokeless tobacco. She reports previous alcohol use. She reports that she does not use drugs.  Family History: family history includes Heart disease in her brother; Hyperlipidemia in her mother; Hypertension in her mother; Lung disease in her father; Stroke in her mother.   HOME MEDICATIONS: Allergies as of 11/27/2019   No Known Allergies     Medication List       Accurate as of November 27, 2019  2:50 PM. If you have any questions, ask your nurse or doctor.        amLODipine-benazepril 5-10 MG capsule Commonly known as: LOTREL Take 1 capsule by mouth daily.   aspirin EC 81 MG tablet Take 81 mg by mouth daily. Swallow whole.   atorvastatin 20 MG tablet Commonly known as: LIPITOR Take 1 tablet (20 mg total) by mouth daily.   Coenzyme Q10 200 MG capsule Take 200 mg by mouth daily.   conjugated estrogens vaginal cream Commonly known as: PREMARIN Place 1 Applicatorful vaginally once a week.   diazepam 5 MG  tablet Commonly known as: VALIUM Take 0.5-1 tablets (2.5-5 mg total) by mouth every 12 (twelve) hours as needed for anxiety. What changed:   how much to take  when to take this   estrogens (conjugated) 0.3 MG tablet Commonly known as: PREMARIN Take 0.3 mg by mouth every Monday, Wednesday, and Friday.   hydrochlorothiazide 25 MG tablet Commonly known as: HYDRODIURIL Take 0.5 tablets (12.5 mg total) by mouth daily.   HYDROcodone-acetaminophen 5-325 MG tablet Commonly known as: NORCO/VICODIN Take 1 tablet by mouth 2 (two) times daily as needed for pain.   levothyroxine 50 MCG tablet Commonly known as: SYNTHROID Take 1 tablet (50 mcg total) by mouth  daily.   multivitamin with minerals Tabs tablet Take 1 tablet by mouth daily.   PROBIOTIC-10 PO Take 1 tablet by mouth daily.   senna 8.6 MG tablet Commonly known as: SENOKOT Take 2 tablets by mouth daily.   SYSTANE OP Place 1 drop into both eyes daily as needed (dry eyes).   Vitamin D (Cholecalciferol) 25 MCG (1000 UT) Caps Take 1 capsule by mouth daily. What changed:   medication strength  how much to take Changed by: Dorita Sciara, MD   zolpidem 12.5 MG CR tablet Commonly known as: Ambien CR Take 1 tablet (12.5 mg total) by mouth at bedtime as needed for sleep.         REVIEW OF SYSTEMS: A comprehensive ROS was conducted with the patient and is negative except as per HPI     OBJECTIVE:  VS: BP (!) 148/82    Pulse 78    Ht 5\' 4"  (1.626 m)    Wt 131 lb (59.4 kg)    SpO2 98%    BMI 22.49 kg/m    Wt Readings from Last 3 Encounters:  11/27/19 131 lb (59.4 kg)  11/14/19 130 lb 9.6 oz (59.2 kg)  09/27/19 136 lb (61.7 kg)     EXAM: General: Pt appears well and is in NAD  Neck: General: Supple without adenopathy. Thyroid: Thyroid size normal.  No goiter or nodules appreciated. No thyroid bruit.  Lungs: Clear with good BS bilat with no rales, rhonchi, or wheezes  Heart: Auscultation: RRR.  Abdomen: Normoactive bowel sounds, soft, nontender, without masses or organomegaly palpable  Extremities:  BL LE: No pretibial edema normal ROM and strength.  Skin: Hair: Texture and amount normal with gender appropriate distribution Skin Inspection: No rashes Skin Palpation: Skin temperature, texture, and thickness normal to palpation  Neuro: Cranial nerves: II - XII grossly intact  DTRs: 2+ and symmetric in UE without delay in relaxation phase  Mental Status: Judgment, insight: Intact Memory: Intact for recent and remote events Mood and affect: No depression, anxiety, or agitation     DATA REVIEWED:   Results for ALDINA, PORTA (MRN [id number]) as of  11/27/2019 14:51  Ref. Range 05/03/2019 15:38 07/17/2019 14:54 08/15/2019 09:47 11/14/2019 08:39  TSH Latest Ref Range: 0.40 - 4.50 mIU/L 2.14 7.72 (H) 0.25 (L) 0.08 (L)  Triiodothyronine (T3) Latest Ref Range: 76 - 181 ng/dL   64 (L)   Thyroxine (T4) Latest Ref Range: 5.1 - 11.9 mcg/dL   11.1     ASSESSMENT/PLAN/RECOMMENDATIONS:   1. Hypothyroidism:  - She is clinically euthyroid - No local neck symptoms  - Her levothyroxine dose was recently reduced to 50 mcg daily . Its too soon to check today, will recheck in 6 weeks    Medications : Continue Levothyroxine 50 mcg daily  Labs in 6 weeks  F/U in 3 months     Signed electronically by: Mack Guise, MD  [facility] Endocrinology  [place] [facility] [address] [place], [place] [address] Phone: [phone] FAX: [phone]   CC: Vivi Barrack, [place] [address] Phone: [phone] Fax: [phone]   Return to Endocrinology clinic as below: Future Appointments  Date Time Provider [place]  03/04/2020  9:50 AM Subrena Devereux, Melanie Crazier, MD [facility] [place]  04/15/2020  9:00 AM Lavonna Monarch, MD [facility]

## 2019-11-27 NOTE — Patient Instructions (Signed)
-   Continue Levothyroxine 50 mcg daily for now   - Labs in 6 weeks     You are on levothyroxine - which is your thyroid hormone supplement. You MUST take this consistently.  You should take this first thing in the morning on an empty stomach with water. You should not take it with other medications. Wait 62min to 1hr prior to eating. If you are taking any vitamins - please take these in the evening.   If you miss a dose, please take your missed dose the following day (double the dose for that day). You should have a pill box for ONLY levothyroxine on your bedside table to help you remember to take your medications.

## 2019-11-27 NOTE — Progress Notes (Signed)
Please inform patient of the following:  Her iron levels are NORMAL. She would not benefit from an iron infusion.   I am still concerned about her B12 level. Would like to check one more set of labs.  Please place future order for methylmalonic acid, homocystine, and folate.

## 2019-12-03 ENCOUNTER — Other Ambulatory Visit: Payer: Medicare Other

## 2019-12-07 HISTORY — PX: VAGINAL PROLAPSE REPAIR: SHX830

## 2020-01-01 ENCOUNTER — Other Ambulatory Visit: Payer: Self-pay | Admitting: Family Medicine

## 2020-01-01 NOTE — Telephone Encounter (Signed)
  LAST APPOINTMENT DATE: 11/22/2019   NEXT APPOINTMENT DATE:@11 /30/2021  MEDICATION: zolpidem (AMBIEN CR) 12.5 MG CR tablet  PHARMACY: [facility] [phone] - [place], [place] - [address] AT [place]: Patient states she has 3 pills left and would like a refill as soon as possible.

## 2020-01-08 ENCOUNTER — Other Ambulatory Visit: Payer: Self-pay

## 2020-01-08 ENCOUNTER — Other Ambulatory Visit: Payer: Self-pay | Admitting: *Deleted

## 2020-01-08 ENCOUNTER — Other Ambulatory Visit: Payer: TRICARE For Life (TFL)

## 2020-01-08 DIAGNOSIS — E538 Deficiency of other specified B group vitamins: Secondary | ICD-10-CM

## 2020-01-08 DIAGNOSIS — E039 Hypothyroidism, unspecified: Secondary | ICD-10-CM

## 2020-01-08 DIAGNOSIS — D649 Anemia, unspecified: Secondary | ICD-10-CM

## 2020-01-09 LAB — T3, FREE: T3, Free: 2.3 pg/mL (ref 2.3–4.2)

## 2020-01-09 NOTE — Progress Notes (Signed)
Please inform patient of the following:  Blood work is all within acceptable ranges. Would like for her to follow up with endocrinology as planned.  Algis Greenhouse. Jerline Pain, MD 01/09/2020 11:50 AM

## 2020-01-09 NOTE — Progress Notes (Signed)
Please inform patient of the following:  T3 level is normal.

## 2020-01-10 ENCOUNTER — Other Ambulatory Visit: Payer: Self-pay | Admitting: Family Medicine

## 2020-01-10 ENCOUNTER — Telehealth: Payer: Self-pay | Admitting: Internal Medicine

## 2020-01-10 DIAGNOSIS — Z1231 Encounter for screening mammogram for malignant neoplasm of breast: Secondary | ICD-10-CM

## 2020-01-10 NOTE — Telephone Encounter (Signed)
Tried to patient but she hung up the phone before I could say anything

## 2020-01-10 NOTE — Telephone Encounter (Signed)
Spoken to patient and confirm labs were drawn under Dr Jerline Pain regarding her hypothyroidism.  Please advise

## 2020-01-10 NOTE — Telephone Encounter (Signed)
Patient called about her lab results - would like a call back at [phone]

## 2020-01-10 NOTE — Telephone Encounter (Signed)
TSH slightly elevated this time. Please ask her to increase levothyroxine as below   Levothyroxine 50 mcg TWO tablets on Sundays from now on and 1 tablet rest of the week    Thanks

## 2020-01-11 LAB — FOLATE: Folate: 15.1 ng/mL

## 2020-01-11 LAB — THYROID PEROXIDASE ANTIBODY: Thyroperoxidase Ab SerPl-aCnc: 444 IU/mL — ABNORMAL HIGH (ref <9)

## 2020-01-11 LAB — METHYLMALONIC ACID, SERUM: Methylmalonic Acid, Quant: 190 nmol/L (ref 87–318)

## 2020-01-11 LAB — TSH: TSH: 7.06 mIU/L — ABNORMAL HIGH (ref 0.40–4.50)

## 2020-01-11 LAB — HOMOCYSTEINE: Homocysteine: 14.3 umol/L — ABNORMAL HIGH (ref <10.4)

## 2020-01-11 LAB — T4, FREE: Free T4: 1.1 ng/dL (ref 0.8–1.8)

## 2020-01-14 NOTE — Telephone Encounter (Signed)
Message left for patient to return my call.  

## 2020-01-14 NOTE — Telephone Encounter (Signed)
Spoken to patient and notified Dr Shamleffer's comments. Verbalized understanding.   

## 2020-02-22 ENCOUNTER — Ambulatory Visit
Admission: RE | Admit: 2020-02-22 | Discharge: 2020-02-22 | Disposition: A | Discharge status: Home / Self Care | Payer: TRICARE For Life (TFL) | Source: Ambulatory Visit | Attending: Family Medicine | Admitting: Family Medicine

## 2020-02-22 ENCOUNTER — Other Ambulatory Visit: Payer: Self-pay

## 2020-02-22 DIAGNOSIS — Z1231 Encounter for screening mammogram for malignant neoplasm of breast: Secondary | ICD-10-CM

## 2020-03-04 ENCOUNTER — Other Ambulatory Visit: Payer: Self-pay

## 2020-03-04 ENCOUNTER — Encounter: Payer: Self-pay | Admitting: Internal Medicine

## 2020-03-04 ENCOUNTER — Ambulatory Visit (INDEPENDENT_AMBULATORY_CARE_PROVIDER_SITE_OTHER): Payer: Medicare Other | Admitting: Internal Medicine

## 2020-03-04 VITALS — BP 144/84 | HR 90 | Ht 64.0 in | Wt 135.2 lb

## 2020-03-04 DIAGNOSIS — E039 Hypothyroidism, unspecified: Secondary | ICD-10-CM | POA: Diagnosis not present

## 2020-03-04 DIAGNOSIS — E063 Autoimmune thyroiditis: Secondary | ICD-10-CM | POA: Diagnosis not present

## 2020-03-04 LAB — TSH: TSH: 1.84 u[IU]/mL (ref 0.35–4.50)

## 2020-03-04 LAB — T4, FREE: Free T4: 0.75 ng/dL (ref 0.60–1.60)

## 2020-03-04 NOTE — Patient Instructions (Addendum)
   Continue Multivitamin D daily  Add calcium 500 mg daily  Continue Vitamin D 1000 iu daily  Continue Potassium 99 mg daily    You can stop the rest

## 2020-03-04 NOTE — Progress Notes (Signed)
Name: Linda Knight  MRN/ DOB: [id number], [birth year]    Age/ Sex: 80 y.o., female     PCP: Vivi Barrack, MD   Reason for Endocrinology Evaluation: Hypothyroidism     Initial Endocrinology Clinic Visit: 11/27/2019    PATIENT IDENTIFIER: Linda Knight is a 80 y.o., female with a past medical history of carotid artery stenosis, HTN and hypothyroidism. She has followed with [facility] since 11/27/2019 for consultative assistance with management of her Hypothyroidism  HISTORICAL SUMMARY:   Pt has been diagnosed with hypothyroidism many years and has been on LT-4 replacement since her diagnosis. She has been on 75 mcg until 18 months ago requiring 37.5 mcg daily   In July, 2021 she has been noted with low TSH with a nadir of 0.08 uIU/mL    No known FH of thyroid disease   SUBJECTIVE:    Today (03/04/2020):  Linda Knight is here for hypothyroidism.      Had her bladder sx due to vaginal prolapse 11/2019  Weight has been stable  Denies constipation- uses miralax  Denies palpitations    Levothyroxine 50 mcg , 2 tabs on Sundays and 1 tab rest of the week- Has been taking 1 tablet daily      HISTORY:  Past Medical History:  Past Medical History:  Diagnosis Date  . Anemia   . Arthritis   . CAD (coronary artery disease)   . High cholesterol   . Hypertension   . Pancreatic cyst   . Sleep apnea   . Thyroid disease     Past Surgical History:  Past Surgical History:  Procedure Laterality Date  . ABDOMINAL HYSTERECTOMY    . APPENDECTOMY    . BIOPSY  09/27/2019   Procedure: BIOPSY;  Surgeon: Milus Banister, MD;  [facility]: [facility];  Service: Endoscopy;;  . BREAST EXCISIONAL BIOPSY Right   . BUNIONECTOMY    . CYSTOCELE REPAIR     bladder repair  . ESOPHAGOGASTRODUODENOSCOPY (EGD) WITH PROPOFOL N/A 09/27/2019   Procedure: ESOPHAGOGASTRODUODENOSCOPY (EGD) WITH PROPOFOL;  Surgeon: Milus Banister, MD;  [facility];   Service: Endoscopy;  Laterality: N/A;  . EUS N/A 09/27/2019   Procedure: UPPER ENDOSCOPIC ULTRASOUND (EUS) RADIAL;  Surgeon: Milus Banister, MD;  [facility];  Service: Endoscopy;  Laterality: N/A;     Social History:  reports that she quit smoking about 39 years ago. She has never used smokeless tobacco. She reports previous alcohol use. She reports that she does not use drugs. Family History:  Family History  Problem Relation Age of Onset  . Hyperlipidemia Mother   . Hypertension Mother   . Stroke Mother   . Lung disease Father   . Heart disease Brother   . Colon cancer Neg Hx       HOME MEDICATIONS: Allergies as of 03/04/2020   No Known Allergies     Medication List       Accurate as of March 04, 2020  7:17 AM. If you have any questions, ask your nurse or doctor.        amLODipine-benazepril 5-10 MG capsule Commonly known as: LOTREL Take 1 capsule by mouth daily.   aspirin EC 81 MG tablet Take 81 mg by mouth daily. Swallow whole.   atorvastatin 20 MG tablet Commonly known as: LIPITOR Take 1 tablet (20 mg total) by mouth daily.   Coenzyme Q10 200 MG capsule Take 200 mg by mouth daily.  conjugated estrogens vaginal cream Commonly known as: PREMARIN Place 1 Applicatorful vaginally once a week.   diazepam 5 MG tablet Commonly known as: VALIUM Take 0.5-1 tablets (2.5-5 mg total) by mouth every 12 (twelve) hours as needed for anxiety. What changed:   how much to take  when to take this   estrogens (conjugated) 0.3 MG tablet Commonly known as: PREMARIN Take 0.3 mg by mouth every Monday, Wednesday, and Friday.   hydrochlorothiazide 25 MG tablet Commonly known as: HYDRODIURIL Take 0.5 tablets (12.5 mg total) by mouth daily.   HYDROcodone-acetaminophen 5-325 MG tablet Commonly known as: NORCO/VICODIN Take 1 tablet by mouth 2 (two) times daily as needed for pain.   levothyroxine 50 MCG tablet Commonly known as: SYNTHROID Take 1 tablet  (50 mcg total) by mouth daily.   multivitamin with minerals Tabs tablet Take 1 tablet by mouth daily.   PROBIOTIC-10 PO Take 1 tablet by mouth daily.   senna 8.6 MG tablet Commonly known as: SENOKOT Take 2 tablets by mouth daily.   SYSTANE OP Place 1 drop into both eyes daily as needed (dry eyes).   Vitamin D (Cholecalciferol) 25 MCG (1000 UT) Caps Take 1 capsule by mouth daily.   zolpidem 12.5 MG CR tablet Commonly known as: AMBIEN CR TAKE 1 TABLET(12.5 MG) BY MOUTH AT BEDTIME AS NEEDED FOR SLEEP         OBJECTIVE:   PHYSICAL EXAM: VS: BP (!) 144/84   Pulse 90   Ht 5\' 4"  (1.626 m)   Wt 135 lb 4 oz (61.3 kg)   SpO2 98%   BMI 23.22 kg/m    EXAM: General: Pt appears well and is in NAD  Neck: General: Supple without adenopathy. Thyroid: Thyroid size normal.  No goiter or nodules appreciated.  Lungs: Clear with good BS bilat with no rales, rhonchi, or wheezes  Heart: Auscultation: RRR.  Abdomen: Normoactive bowel sounds, soft, nontender, without masses or organomegaly palpable  Extremities:  BL LE: No pretibial edema normal ROM and strength.  Mental Status: Judgment, insight: Intact Mood and affect: No depression, anxiety, or agitation     DATA REVIEWED:  Results for KEARA, BEBOUT (MRN [id number]) as of 03/05/2020 11:45  Ref. Range 03/04/2020 10:27  TSH Latest Ref Range: 0.35 - 4.50 uIU/mL 1.84  T4,Free(Direct) Latest Ref Range: 0.60 - 1.60 ng/dL 0.75     ASSESSMENT / PLAN / RECOMMENDATIONS:   1. Hypothyroidism:    - Pt is clinically and biochemically euthyroid  - She was supposed to increase Levothyroxine to 2 tabs on sundays and 1 tabl rest of the week with a TSH of 7 uIU/Ml but interestingly enough her TSh is normal with taking levothyroxine 1 tablet daily including sundays   Medications   Levothyroxine 50 mcg daily    2. Hx of Hypokalemia :  - Pt had question about her supplements and what she should and shouldn't take  - She is on HCTZ  , and I suspect this is the cause for hypokalemia, but she had stopped taking the potassium for a while and just restarted it.   My advise is to continue potassium 99 mg daily, and need to have a recheck of BMP, prior to determining the appropriate dose  She was also advised to take Multivitamin in addition to calcium 500 mg daily and Vitamin D 1000 iu daily and stop the rest of her supplements.       Signed electronically by: Mack Guise, MD  [facility] Endocrinology  [place] [facility] [address], [place], [place] [address] Phone: [phone] FAX: [phone]      CC: Vivi Barrack, [place] [address] Phone: [phone]  Fax: [phone]   Return to Endocrinology clinic as below: Future Appointments  Date Time Provider [place]  03/04/2020  9:50 AM Stevie Charter, Melanie Crazier, MD [facility]  03/05/2020 10:30 AM Rozetta Nunnery, MD [facility] None  04/15/2020  9:00 AM Lavonna Monarch, MD [facility]

## 2020-03-05 ENCOUNTER — Ambulatory Visit (INDEPENDENT_AMBULATORY_CARE_PROVIDER_SITE_OTHER): Payer: Medicare Other | Admitting: Otolaryngology

## 2020-03-05 VITALS — Temp 97.3°F

## 2020-03-05 DIAGNOSIS — H9012 Conductive hearing loss, unilateral, left ear, with unrestricted hearing on the contralateral side: Secondary | ICD-10-CM | POA: Diagnosis not present

## 2020-03-05 DIAGNOSIS — J310 Chronic rhinitis: Secondary | ICD-10-CM

## 2020-03-05 DIAGNOSIS — H7292 Unspecified perforation of tympanic membrane, left ear: Secondary | ICD-10-CM

## 2020-03-05 MED ORDER — LEVOTHYROXINE SODIUM 50 MCG PO TABS: 50.0000 ug | ORAL_TABLET | Freq: Every day | ORAL | 3 refills | Status: DC

## 2020-03-05 NOTE — Progress Notes (Signed)
HPI: Linda Knight is a 80 y.o. female who returns today for evaluation of her hearing as she has a chronic hole in the left ear. She is not hearing as well in the left ear and is having increasing ringing in her ears. She had a previous hearing test performed in June 2020 that showed a 20 to 30 dB conductive loss in the left ear with a perforation. SRT's at that time were 25 DB on the right and 45 DB on the left. She also complains of "sinus pressure" and drainage mostly at night. She has had no drainage from the left ear.. Denies any heart disease although she does have irregular heartbeat. She is on no blood thinners.  Past Medical History:  Diagnosis Date  . Anemia   . Arthritis   . CAD (coronary artery disease)   . High cholesterol   . Hypertension   . Pancreatic cyst   . Sleep apnea   . Thyroid disease    Past Surgical History:  Procedure Laterality Date  . ABDOMINAL HYSTERECTOMY    . APPENDECTOMY    . BIOPSY  09/27/2019   Procedure: BIOPSY;  Surgeon: Milus Banister, MD;  [facility];  Service: Endoscopy;;  . BREAST EXCISIONAL BIOPSY Right   . BUNIONECTOMY    . CYSTOCELE REPAIR     bladder repair  . ESOPHAGOGASTRODUODENOSCOPY (EGD) WITH PROPOFOL N/A 09/27/2019   Procedure: ESOPHAGOGASTRODUODENOSCOPY (EGD) WITH PROPOFOL;  Surgeon: Milus Banister, MD;  [facility];  Service: Endoscopy;  Laterality: N/A;  . EUS N/A 09/27/2019   Procedure: UPPER ENDOSCOPIC ULTRASOUND (EUS) RADIAL;  Surgeon: Milus Banister, MD;  [facility];  Service: Endoscopy;  Laterality: N/A;   Social History   Socioeconomic History  . Marital status: Married    Spouse name: Not on file  . Number of children: Not on file  . Years of education: Not on file  . Highest education level: Not on file  Occupational History  . Occupation: Retired   Tobacco Use  . Smoking status: Former Smoker    Quit date: 09/18/1980    Years since quitting: 39.4  . Smokeless tobacco:  Never Used  Vaping Use  . Vaping Use: Never used  Substance and Sexual Activity  . Alcohol use: Not Currently  . Drug use: Never  . Sexual activity: Not Currently    Comment: 1st intercourse 80 yo-Fewer than 5 partners  Other Topics Concern  . Not on file  Social History Narrative   Moved from [facility] July 2020   Social Determinants of Health   Financial Resource Strain: Not on file  Food Insecurity: Not on file  Transportation Needs: Not on file  Physical Activity: Not on file  Stress: Not on file  Social Connections: Not on file   Family History  Problem Relation Age of Onset  . Hyperlipidemia Mother   . Hypertension Mother   . Stroke Mother   . Lung disease Father   . Heart disease Brother   . Colon cancer Neg Hx    No Known Allergies Prior to Admission medications   Medication Sig Start Date End Date Taking? Authorizing Provider  amLODipine-benazepril (LOTREL) 5-10 MG capsule Take 1 capsule by mouth daily. 07/17/19   Vivi Barrack, MD  aspirin EC 81 MG tablet Take 81 mg by mouth daily. Swallow whole.    [provider]  atorvastatin (LIPITOR) 20 MG tablet Take 1 tablet (20 mg total) by mouth daily. 10/10/19  Vivi Barrack, MD  Coenzyme Q10 200 MG capsule Take 200 mg by mouth daily.    [provider]  conjugated estrogens (PREMARIN) vaginal cream Place 1 Applicatorful vaginally once a week.     [provider]  estrogens, conjugated, (PREMARIN) 0.3 MG tablet Take 0.3 mg by mouth every Monday, Wednesday, and Friday.     [provider]  hydrochlorothiazide (HYDRODIURIL) 25 MG tablet Take 0.5 tablets (12.5 mg total) by mouth daily. 11/14/19   Vivi Barrack, MD  levothyroxine (SYNTHROID) 50 MCG tablet Take 1 tablet (50 mcg total) by mouth daily. 03/05/20   Shamleffer, Melanie Crazier, MD  Multiple Vitamin (MULTIVITAMIN WITH MINERALS) TABS tablet Take 1 tablet by mouth daily.    [provider]  Polyethyl Glycol-Propyl Glycol  (SYSTANE OP) Place 1 drop into both eyes daily as needed (dry eyes).    [provider]  senna (SENOKOT) 8.6 MG tablet Take 2 tablets by mouth daily.    [provider]  Vitamin D, Cholecalciferol, 25 MCG (1000 UT) CAPS Take 1 capsule by mouth daily. 11/27/19   Shamleffer, Melanie Crazier, MD  zolpidem (AMBIEN CR) 12.5 MG CR tablet TAKE 1 TABLET(12.5 MG) BY MOUTH AT BEDTIME AS NEEDED FOR SLEEP 01/01/20   Vivi Barrack, MD     Positive ROS: Otherwise negative  All other systems have been reviewed and were otherwise negative with the exception of those mentioned in the HPI and as above.  Physical Exam: Constitutional: Alert, well-appearing, no acute distress Ears: External ears without lesions or tenderness. Right ear canal and right TM are clear. On examination of the left ear she has a inferior perforation with debris within the perforation consistent with probable cholesteatoma. There is no active drainage noted. On attempting to remove the debris it caused pain in as well as some dizziness. Nasal: External nose without lesions. Septum midline with mild rhinitis. Both middle meatus regions were clear with no evidence of active infection..  Oral: Lips and gums without lesions. Tongue and palate mucosa without lesions. Posterior oropharynx clear. Neck: No palpable adenopathy or masses Respiratory: Breathing comfortably  Skin: No facial/neck lesions or rash noted.  Audiologic testing demonstrated progression of her conductive loss on the left side now being of approximately 30-35 dB in the left ear. SRT's today were 55 dB on the left side and 20 DB on the right side.  Procedures  Assessment: Chronic rhinitis. Left ear TM perforation with questionable cholesteatoma and conductive hearing loss.  Plan: Concerning the nasal sinus issues recommended use of Flonase. Recommended surgical intervention to help repair the left TM as well as to clean the debris from the middle  ear space and possible ossicular chain reconstruction as she does have significant hearing loss on the left side. Briefly discussed surgery with her however she is a little bit hesitant about surgery because her husband had surgeries on both ears and apparently lost hearing in one of his ears following surgery. Consider temporal bone CT scan for better evaluation of middle ear if patient elects to have surgery. She will call us back if she decides to pursue surgery.   Radene Journey, MD

## 2020-03-07 ENCOUNTER — Encounter (INDEPENDENT_AMBULATORY_CARE_PROVIDER_SITE_OTHER): Payer: Self-pay

## 2020-04-15 ENCOUNTER — Ambulatory Visit (INDEPENDENT_AMBULATORY_CARE_PROVIDER_SITE_OTHER): Payer: Medicare Other | Admitting: Dermatology

## 2020-04-15 ENCOUNTER — Encounter: Payer: Self-pay | Admitting: Dermatology

## 2020-04-15 ENCOUNTER — Other Ambulatory Visit: Payer: Self-pay

## 2020-04-15 DIAGNOSIS — Z85828 Personal history of other malignant neoplasm of skin: Secondary | ICD-10-CM | POA: Diagnosis not present

## 2020-04-15 DIAGNOSIS — L905 Scar conditions and fibrosis of skin: Secondary | ICD-10-CM

## 2020-04-15 DIAGNOSIS — L570 Actinic keratosis: Secondary | ICD-10-CM | POA: Diagnosis not present

## 2020-04-15 DIAGNOSIS — T148XXA Other injury of unspecified body region, initial encounter: Secondary | ICD-10-CM

## 2020-04-15 DIAGNOSIS — L821 Other seborrheic keratosis: Secondary | ICD-10-CM | POA: Diagnosis not present

## 2020-04-15 DIAGNOSIS — Z1283 Encounter for screening for malignant neoplasm of skin: Secondary | ICD-10-CM | POA: Diagnosis not present

## 2020-04-15 DIAGNOSIS — D692 Other nonthrombocytopenic purpura: Secondary | ICD-10-CM

## 2020-04-15 DIAGNOSIS — D1801 Hemangioma of skin and subcutaneous tissue: Secondary | ICD-10-CM

## 2020-04-15 NOTE — Patient Instructions (Signed)
Use Dermend on arms it is over the counter

## 2020-05-03 ENCOUNTER — Encounter: Payer: Self-pay | Admitting: Dermatology

## 2020-05-03 NOTE — Progress Notes (Signed)
   New Patient   Subjective  Linda Knight is a 80 y.o. female who presents for the following: New Patient (Initial Visit) (Patient here today for yearly skin check. 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 Per patient she does have a few places that are itching, no bleeding x years.).  General skin examination, several issues to discuss Location:  Duration:  Quality:  Associated Signs/Symptoms: Modifying Factors:  Severity:  Timing: Context:    The following portions of the chart were reviewed this encounter and updated as appropriate:  Tobacco  Allergies  Meds  Problems  Med Hx  Surg Hx  Fam Hx      Objective  Well appearing patient in no apparent distress; mood and affect are within normal limits. Objective  Left Temporal Scalp, Mid Back: Textured brown papules on left temple, left back side, left cheek, left side torso, flat keratosis on both legs  Objective  Left Parotid Area, Right Forehead: Previous treatment unsure of dx Scar is clear  Objective  Right Abdomen (side) - Lower: On abdomen, large on right side of abdomen: To millimeter smooth red papules  Objective  Chest - Medial [facility]): Sun exposed areas, upper chest, abdomen, back examined: No atypical moles, no skin skin cancer  Objective  Right Forehead: Pink 5 mm hornlike crust  Objective  Left Forearm - Posterior: Left forearm three 1 cm ecchymoses.  No history of abnormal bleeding elsewhere.    All sun exposed areas plus back examined.   Assessment & Plan  Seborrheic keratosis (2) Mid Back; Left Temporal Scalp  Intervention not currently indicated  Scar (2) Left Parotid Area; Right Forehead  Recheck as needed change  Cherry angioma Right Abdomen (side) - Lower  No treatment necessary  Screening exam for skin cancer Chest - Medial (Center)  Annual skin examination.  AK (actinic keratosis) Right Forehead  Destruction of  lesion - Right Forehead Complexity: simple   Destruction method: cryotherapy   Informed consent: discussed and consent obtained   Timeout:  patient name, date of birth, surgical site, and procedure verified Lesion destroyed using liquid nitrogen: Yes   Cryotherapy cycles:  5 Outcome: patient tolerated procedure well with no complications    Solar purpura ([place]) Left Forearm - Posterior  May try over-the-counter Dermend cream daily after bathing to see if this will reduce easy bruising on arms.

## 2020-05-19 ENCOUNTER — Encounter: Payer: Self-pay | Admitting: Family Medicine

## 2020-05-19 NOTE — Telephone Encounter (Signed)
See note

## 2020-05-28 ENCOUNTER — Other Ambulatory Visit: Payer: Self-pay | Admitting: Family Medicine

## 2020-05-28 NOTE — Telephone Encounter (Signed)
Last refill 12/01/2019 Last OV 11/14/2019 dx  hypertension

## 2020-06-08 HISTORY — PX: TYMPANOPLASTY: SHX33

## 2020-08-30 ENCOUNTER — Emergency Department (HOSPITAL_COMMUNITY)
Admission: EM | Admit: 2020-08-30 | Discharge: 2020-08-30 | Discharge status: Against Medical Advice | Payer: Medicare Other | Attending: Emergency Medicine | Admitting: Emergency Medicine

## 2020-08-30 ENCOUNTER — Encounter (HOSPITAL_COMMUNITY): Payer: Self-pay | Admitting: Emergency Medicine

## 2020-08-30 ENCOUNTER — Emergency Department (HOSPITAL_COMMUNITY): Payer: Medicare Other

## 2020-08-30 DIAGNOSIS — I959 Hypotension, unspecified: Secondary | ICD-10-CM | POA: Insufficient documentation

## 2020-08-30 DIAGNOSIS — Z5321 Procedure and treatment not carried out due to patient leaving prior to being seen by health care provider: Secondary | ICD-10-CM | POA: Diagnosis not present

## 2020-08-30 DIAGNOSIS — R42 Dizziness and giddiness: Secondary | ICD-10-CM | POA: Insufficient documentation

## 2020-08-30 DIAGNOSIS — I10 Essential (primary) hypertension: Secondary | ICD-10-CM

## 2020-08-30 LAB — CBC WITH DIFFERENTIAL/PLATELET
Abs Immature Granulocytes: 0.01 10*3/uL (ref 0.00–0.07)
Basophils Absolute: 0 10*3/uL (ref 0.0–0.1)
Basophils Relative: 0 %
Eosinophils Absolute: 0 10*3/uL (ref 0.0–0.5)
Eosinophils Relative: 0 %
HCT: 33.7 % — ABNORMAL LOW (ref 36.0–46.0)
Hemoglobin: 12 g/dL (ref 12.0–15.0)
Immature Granulocytes: 0 %
Lymphocytes Relative: 24 %
Lymphs Abs: 1.4 10*3/uL (ref 0.7–4.0)
MCH: 33.9 pg (ref 26.0–34.0)
MCHC: 35.6 g/dL (ref 30.0–36.0)
MCV: 95.2 fL (ref 80.0–100.0)
Monocytes Absolute: 0.4 10*3/uL (ref 0.1–1.0)
Monocytes Relative: 7 %
Neutro Abs: 4.2 10*3/uL (ref 1.7–7.7)
Neutrophils Relative %: 69 %
Platelets: 287 10*3/uL (ref 150–400)
RBC: 3.54 MIL/uL — ABNORMAL LOW (ref 3.87–5.11)
RDW: 12.3 % (ref 11.5–15.5)
WBC: 6.1 10*3/uL (ref 4.0–10.5)
nRBC: 0 % (ref 0.0–0.2)

## 2020-08-30 LAB — COMPREHENSIVE METABOLIC PANEL
ALT: 23 U/L (ref 0–44)
AST: 33 U/L (ref 15–41)
Albumin: 4.6 g/dL (ref 3.5–5.0)
Alkaline Phosphatase: 68 U/L (ref 38–126)
Anion gap: 13 (ref 5–15)
BUN: 17 mg/dL (ref 8–23)
CO2: 27 mmol/L (ref 22–32)
Calcium: 9.8 mg/dL (ref 8.9–10.3)
Chloride: 87 mmol/L — ABNORMAL LOW (ref 98–111)
Creatinine, Ser: 1.18 mg/dL — ABNORMAL HIGH (ref 0.44–1.00)
GFR, Estimated: 47 mL/min — ABNORMAL LOW (ref >60)
Glucose, Bld: 145 mg/dL — ABNORMAL HIGH (ref 70–99)
Potassium: 3.2 mmol/L — ABNORMAL LOW (ref 3.5–5.1)
Sodium: 127 mmol/L — ABNORMAL LOW (ref 135–145)
Total Bilirubin: 0.4 mg/dL (ref 0.3–1.2)
Total Protein: 7.8 g/dL (ref 6.5–8.1)

## 2020-08-30 LAB — TROPONIN I (HIGH SENSITIVITY): Troponin I (High Sensitivity): 9 ng/L (ref <18)

## 2020-08-30 NOTE — ED Provider Notes (Signed)
Emergency Medicine Provider Triage Evaluation Note  Linda Knight , a 80 y.o. female  was evaluated in triage.  Pt complains of blood pressure issues.  Patient takes amlodipine/benazepril 5 mg / 10 mg once per day.  Also notes taking 10 mg of HCTZ once per day.  She has been taking these medications for years.  Over the past month she has noticed that her blood pressure has been fluctuating both high and low.  She has not been able to follow-up with her PCP so she came to the emergency department.  When she woke up this morning her blood pressure was 99/69 and she was feeling lightheaded as well as nauseated.  She took a half dose of her blood pressure medications and now endorses a high blood pressure.  She also notes some mild irritation to the left anterior inferior chest wall.  She notes a history of a pancreatic cyst and sometimes she will have pain in this region but she is unsure if her current pain is because of this.  No shortness of breath or vomiting.  Physical Exam  BP (!) 179/102   Pulse 96   Temp 98.2 F (36.8 C) (Oral)   Resp 19   SpO2 99%  Gen:   Awake, no distress   Resp:  Normal effort  MSK:   Moves extremities without difficulty  Other:    Medical Decision Making  Medically screening exam initiated at 4:47 PM.  Appropriate orders placed.  Linda Knight was informed that the remainder of the evaluation will be completed by another provider, this initial triage assessment does not replace that evaluation, and the importance of remaining in the ED until their evaluation is complete.   Linda Sexton, PA-C 08/30/20 1648    Linda Fuse, MD 08/31/20 1109

## 2020-08-30 NOTE — ED Triage Notes (Signed)
Pt reports her BP has been low and high for over a month but not able to see her PCP. Reports she took half her BP meds today because it was low (99/69) and then high the rest of the day. Endorses lightheadedness when her BP is low.

## 2020-08-30 NOTE — ED Notes (Signed)
Pt stated she was leaving AMA, not receptive to requests to stay and stated she was going to urgent care in the morning

## 2020-09-02 ENCOUNTER — Ambulatory Visit (INDEPENDENT_AMBULATORY_CARE_PROVIDER_SITE_OTHER): Payer: Medicare Other | Admitting: Internal Medicine

## 2020-09-02 ENCOUNTER — Encounter: Payer: Self-pay | Admitting: Internal Medicine

## 2020-09-02 ENCOUNTER — Other Ambulatory Visit: Payer: Self-pay

## 2020-09-02 VITALS — BP 136/80 | HR 62 | Ht 64.0 in | Wt 131.4 lb

## 2020-09-02 DIAGNOSIS — E871 Hypo-osmolality and hyponatremia: Secondary | ICD-10-CM

## 2020-09-02 DIAGNOSIS — R1084 Generalized abdominal pain: Secondary | ICD-10-CM

## 2020-09-02 DIAGNOSIS — E063 Autoimmune thyroiditis: Secondary | ICD-10-CM

## 2020-09-02 DIAGNOSIS — I10 Essential (primary) hypertension: Secondary | ICD-10-CM

## 2020-09-02 MED ORDER — POTASSIUM CHLORIDE CRYS ER 20 MEQ PO TBCR: 20.0000 meq | EXTENDED_RELEASE_TABLET | Freq: Every day | ORAL | 1 refills | Status: DC

## 2020-09-02 NOTE — Patient Instructions (Addendum)
-   Drink no more then 64 ounce of fluid a day  - Stop over the counter potassium  - Start Potassium 20 mEq once daily  - Labs in a month   24-Hour Urine Collection  You will be collecting your urine for a 24-hour period of time. Your timer starts with your first urine of the morning (For example - If you first pee at [place], your timer will start at [place]) [place] away your first urine of the morning Collect your urine every time you pee for the next 24 hours STOP your urine collection 24 hours after you started the collection (For example - You would stop at 9AM the day after you started)

## 2020-09-02 NOTE — Progress Notes (Signed)
Name: Linda Knight  MRN/ DOB: [id number], 01-Apr-1940    Age/ Sex: 80 y.o., female     PCP: Pcp, No   Reason for Endocrinology Evaluation: Hypothyroidism     Initial Endocrinology Clinic Visit: 11/27/2019    PATIENT IDENTIFIER: Linda Knight is a 80 y.o., female with a past medical history of carotid artery stenosis, HTN and hypothyroidism. She has followed with [facility] since 11/27/2019 for consultative assistance with management of her Hypothyroidism  HISTORICAL SUMMARY:   Pt has been diagnosed with hypothyroidism many years and has been on LT-4 replacement since her diagnosis. She has been on 75 mcg until 18 months ago requiring 37.5 mcg daily    In July, 2021 she has been noted with low TSH with a nadir of 0.08 uIU/mL    No known FH of thyroid disease   SUBJECTIVE:    Today (09/02/2020):  Linda Knight is here for hypothyroidism.    Weight has been fluctuating   She is having a lot of stomach issues that she attributes to her medications, has abdominal pain with food or without food. Of note, the pt is known to have pancreatic head tumors She attributes some of her pain to cholesterol medication, stopped Lipitor which over a week ago   She has a BP of 199/116 mmhg a few days ago, requiring ED visit.  on 08/30/2020 , repeat 186/100, she signed herself out AMA   Her BP tends to increase through the day  Denies palpitations   She has been drinking approximately 100 oz of water daily  She stopped ETOH a week ago , prior to that she was drinking a shot of alcohol daily   She denies nausea but has occasional dizziness that she attributes elevated BP  Levothyroxine 50 mcg ,daily  She is on OTC potassium 99 , she takes it daily     HISTORY:  Past Medical History:  Past Medical History:  Diagnosis Date   Anemia    Arthritis    CAD (coronary artery disease)    High cholesterol    Hypertension    Pancreatic cyst    SCCA  (squamous cell carcinoma) of skin 2019   Right Arm [facility])   Sleep apnea    Squamous cell carcinoma of skin 2019   Left Leg [facility])   Thyroid disease    Past Surgical History:  Past Surgical History:  Procedure Laterality Date   ABDOMINAL HYSTERECTOMY     APPENDECTOMY     BIOPSY  09/27/2019   Procedure: BIOPSY;  Surgeon: Milus Banister, MD;  Location: [facility];  Service: Endoscopy;;   BREAST EXCISIONAL BIOPSY Right    BUNIONECTOMY     CYSTOCELE REPAIR     bladder repair   ESOPHAGOGASTRODUODENOSCOPY (EGD) WITH PROPOFOL N/A 09/27/2019   Procedure: ESOPHAGOGASTRODUODENOSCOPY (EGD) WITH PROPOFOL;  Surgeon: Milus Banister, MD;  Location: [facility];  Service: Endoscopy;  Laterality: N/A;   EUS N/A 09/27/2019   Procedure: UPPER ENDOSCOPIC ULTRASOUND (EUS) RADIAL;  Surgeon: Milus Banister, MD;  Location: [facility];  Service: Endoscopy;  Laterality: N/A;   Social History:  reports that she quit smoking about 39 years ago. Her smoking use included cigarettes. She has never used smokeless tobacco. She reports previous alcohol use. She reports that she does not use drugs. Family History:  Family History  Problem Relation Age of Onset   Hyperlipidemia Mother    Hypertension Mother  Stroke Mother    Lung disease Father    Heart disease Brother    Colon cancer Neg Hx      HOME MEDICATIONS: Allergies as of 09/02/2020   No Known Allergies      Medication List        Accurate as of September 02, 2020  9:28 AM. If you have any questions, ask your nurse or doctor.          amLODipine-benazepril 5-10 MG capsule Commonly known as: LOTREL Take 1 capsule by mouth daily.   aspirin EC 81 MG tablet Take 81 mg by mouth daily. Swallow whole.   atorvastatin 20 MG tablet Commonly known as: LIPITOR Take 1 tablet (20 mg total) by mouth daily.   Coenzyme Q10 200 MG capsule Take 200 mg by mouth daily.   conjugated estrogens vaginal cream Commonly known as:  PREMARIN Place 1 Applicatorful vaginally once a week.   diazepam 5 MG tablet Commonly known as: VALIUM   estrogens (conjugated) 0.3 MG tablet Commonly known as: PREMARIN Take 0.3 mg by mouth every Monday, Wednesday, and Friday.   hydrochlorothiazide 25 MG tablet Commonly known as: HYDRODIURIL Take 0.5 tablets (12.5 mg total) by mouth daily.   HYDROcodone-acetaminophen 5-325 MG tablet Commonly known as: NORCO/VICODIN   levothyroxine 50 MCG tablet Commonly known as: SYNTHROID Take 1 tablet (50 mcg total) by mouth daily.   methenamine 1 g tablet Commonly known as: HIPREX Take 1 g by mouth 2 (two) times daily.   multivitamin with minerals Tabs tablet Take 1 tablet by mouth daily.   Narcan 4 MG/0.1ML Liqd nasal spray kit Generic drug: naloxone SMARTSIG:1 Spray(s) Both Nares Once PRN   Omega-3 1000 MG Caps Take by mouth.   oxybutynin 5 MG 24 hr tablet Commonly known as: DITROPAN-XL Take 5 mg by mouth daily.   SYSTANE OP Place 1 drop into both eyes daily as needed (dry eyes).   traMADol 50 MG tablet Commonly known as: ULTRAM SMARTSIG:1 By Mouth 4-5 Times Daily   Vitamin D (Cholecalciferol) 25 MCG (1000 UT) Caps Take 1 capsule by mouth daily.   zolpidem 12.5 MG CR tablet Commonly known as: AMBIEN CR TAKE 1 TABLET(12.5 MG) BY MOUTH AT BEDTIME AS NEEDED FOR SLEEP          OBJECTIVE:   PHYSICAL EXAM: VS: BP 136/80 (BP Location: Left Arm, Patient Position: Sitting, Cuff Size: Normal)   Pulse 62   Ht '5\' 4"'  (1.626 m)   Wt 131 lb 6.4 oz (59.6 kg)   BMI 22.55 kg/m    EXAM: General: Pt appears well and is in NAD  Neck: General: Supple without adenopathy. Thyroid: Thyroid size normal.  No goiter or nodules appreciated.  Lungs: Clear with good BS bilat with no rales, rhonchi, or wheezes  Heart: Auscultation: RRR.  Abdomen: Normoactive bowel sounds, soft, nontender, without masses or organomegaly palpable  Extremities:  BL LE: No pretibial edema normal ROM  and strength.  Mental Status: Judgment, insight: Intact Mood and affect: No depression, anxiety, or agitation     DATA REVIEWED: Results for Linda, Knight (MRN [id number]) as of 09/02/2020 07:23  Ref. Range 08/30/2020 16:47  Sodium Latest Ref Range: 135 - 145 mmol/L 127 (L)  Potassium Latest Ref Range: 3.5 - 5.1 mmol/L 3.2 (L)  Chloride Latest Ref Range: 98 - 111 mmol/L 87 (L)  CO2 Latest Ref Range: 22 - 32 mmol/L 27  Glucose Latest Ref Range: 70 - 99 mg/dL 145 (H)  BUN Latest  Ref Range: 8 - 23 mg/dL 17  Creatinine Latest Ref Range: 0.44 - 1.00 mg/dL 1.18 (H)  Calcium Latest Ref Range: 8.9 - 10.3 mg/dL 9.8  Anion gap Latest Ref Range: 5 - 15  13  Alkaline Phosphatase Latest Ref Range: 38 - 126 U/L 68  Albumin Latest Ref Range: 3.5 - 5.0 g/dL 4.6  AST Latest Ref Range: 15 - 41 U/L 33  ALT Latest Ref Range: 0 - 44 U/L 23  Total Protein Latest Ref Range: 6.5 - 8.1 g/dL 7.8  Total Bilirubin Latest Ref Range: 0.3 - 1.2 mg/dL 0.4  GFR, Estimated Latest Ref Range: >60 mL/min 47 (L)  Troponin I (High Sensitivity) Latest Ref Range: <18 ng/L 9     ASSESSMENT / PLAN / RECOMMENDATIONS:   Hypothyroidism:    - Pt is clinically and biochemically euthyroid  - She was supposed to increase Levothyroxine to 2 tabs on sundays and 1 tabl rest of the week with a TSH of 7 uIU/Ml but interestingly enough her TSh is normal with taking levothyroxine 1 tablet daily including sundays   Medications   Levothyroxine 50 mcg daily    2. Hx of Hypokalemia :  -We will proceed with 24-hour urinary excretion of cortisol check up, she will return in a month for Aldo and renin check  -She will stop OTC potassium and start prescription KCl  Medication Start KCl 20 M EQ daily     3. Hyponatremia :  - Chronic since 2021 -Historically she has been on opiates as well as benzodiazepines which both are contributing factor as well as hydrochlorothiazide - Asymptomatic at this time  - D/D SIADH vs  primary polydipsia, I do not  think ETOH is a factor . I also doubt that adrenal insufficiency is a differential given hypertension , HCTZ is another factor  - Will consider further work up in the future    4. Pancreatic Head tumors/Abdominal pain :  - Will refer to GI as this is beyond the scope of endocrinology    5.  Episodic HTN:  -We will screen for pheochromocytoma  Pt advised to establish with PCP   Labs in 4 weeks  Follow-up in 4 months  Signed electronically by: Mack Guise, MD  [facility]  [address] [place], [place] [address] Phone: [phone] FAX: [phone]      CC: Pcp, No No address on file Phone: None  Fax: None   Return to Endocrinology clinic as below: No future appointments.

## 2020-09-03 ENCOUNTER — Telehealth: Payer: Self-pay

## 2020-09-03 ENCOUNTER — Encounter: Payer: Self-pay | Admitting: Internal Medicine

## 2020-09-03 NOTE — Telephone Encounter (Signed)
Patient is calling in stating that she was seen in ED, for low BP. She was advised to go to a cardiologist, and doesn't have a cardiologist wanting a referral to [facility]. No open appointments this week for anyone and Linda Knight is desperate for this referral and wants to know if Dr.Pakrer will place this.

## 2020-09-03 NOTE — Telephone Encounter (Signed)
Ok to place referral.

## 2020-09-04 ENCOUNTER — Encounter: Payer: Self-pay | Admitting: *Deleted

## 2020-09-04 ENCOUNTER — Other Ambulatory Visit: Payer: Self-pay

## 2020-09-04 ENCOUNTER — Other Ambulatory Visit: Payer: Medicare Other

## 2020-09-04 DIAGNOSIS — I10 Essential (primary) hypertension: Secondary | ICD-10-CM

## 2020-09-04 NOTE — Telephone Encounter (Signed)
She is no longer our patient - please see her previous mychart message.   If it is an urgent need I am fine with placing a referral if she cannot get in anywhere else but she needs to follow up with her current PCP.  Algis Greenhouse. Jerline Pain, MD 09/04/2020 8:07 AM

## 2020-09-04 NOTE — Progress Notes (Signed)
Total volume:  2579m  Start: 09/03/20 @ 8am End:  09/04/20 @ 8am

## 2020-09-04 NOTE — Telephone Encounter (Signed)
Spoke to pt told her she will need to contact her PCP for referral since Dr. Jerline Pain is no longer your PCP. Pt verbalized understanding and stated she does not have a PCP. Told pt you sent [place] a message awhile back saying you were no longer going to be a patient. Pt said yes, please accept my apology I have been through a lot and would like to continue care with Dr. Jerline Pain. Told pt she can call back and schedule an appt with Dr. Jerline Pain to discuss referral. Pt verbalized understanding and will call back.

## 2020-09-08 HISTORY — PX: OTHER SURGICAL HISTORY: SHX169

## 2020-09-10 LAB — METANEPHRINES, URINE, 24 HOUR
METANEPHRINE: 127 mcg/24 h (ref 90–315)
METANEPHRINES, TOTAL: 303 mcg/24 h (ref 224–832)
NORMETANEPHRINE: 176 mcg/24 h (ref 122–676)
Total Volume: 2500 mL

## 2020-09-10 LAB — CATECHOLAMINES, FRACTIONATED, URINE, 24 HOUR
Calc Total (E+NE): 23 mcg/24 h — ABNORMAL LOW (ref 26–121)
Creatinine, Urine mg/day-CATEUR: 1.38 g/(24.h) (ref 0.50–2.15)
Dopamine 24 Hr Urine: 50 mcg/24 h — ABNORMAL LOW (ref 52–480)
Norepinephrine, 24H, Ur: 23 mcg/24 h (ref 15–100)
Total Volume: 2500 mL

## 2020-09-10 LAB — CORTISOL, URINE, 24 HOUR
24 Hour urine volume (VMAHVA): 2500 mL
CREATININE, URINE: 0.75 g/(24.h) (ref 0.50–2.15)
Cortisol (Ur), Free: 12.2 mcg/24 h (ref 4.0–50.0)

## 2020-09-12 ENCOUNTER — Telehealth: Payer: Self-pay | Admitting: Gastroenterology

## 2020-09-12 NOTE — Telephone Encounter (Signed)
Called patient and she states she is constipated. She has tried increasing her fiber and water intake, and Miralax QD. I suggested she take Miralax 2-3 times a day until she gets results and then back off it. Titrate to effect. She also wanted to talk to Dr. Tarri Glenn about a colonoscopy. Scheduled office visit on 10/21/20.

## 2020-09-15 ENCOUNTER — Encounter: Payer: Self-pay | Admitting: Family Medicine

## 2020-09-15 ENCOUNTER — Other Ambulatory Visit: Payer: Self-pay

## 2020-09-15 ENCOUNTER — Ambulatory Visit (INDEPENDENT_AMBULATORY_CARE_PROVIDER_SITE_OTHER): Payer: Medicare Other | Admitting: Family Medicine

## 2020-09-15 VITALS — BP 161/89 | HR 90 | Temp 98.1°F | Ht 64.0 in | Wt 130.8 lb

## 2020-09-15 DIAGNOSIS — L858 Other specified epidermal thickening: Secondary | ICD-10-CM

## 2020-09-15 DIAGNOSIS — I10 Essential (primary) hypertension: Secondary | ICD-10-CM | POA: Diagnosis not present

## 2020-09-15 DIAGNOSIS — E039 Hypothyroidism, unspecified: Secondary | ICD-10-CM

## 2020-09-15 DIAGNOSIS — N8110 Cystocele, unspecified: Secondary | ICD-10-CM

## 2020-09-15 DIAGNOSIS — F419 Anxiety disorder, unspecified: Secondary | ICD-10-CM | POA: Diagnosis not present

## 2020-09-15 MED ORDER — DOXYCYCLINE HYCLATE 100 MG PO TABS: 100.0000 mg | ORAL_TABLET | Freq: Two times a day (BID) | ORAL | 0 refills | Status: DC

## 2020-09-15 MED ORDER — CITALOPRAM HYDROBROMIDE 10 MG PO TABS: 10.0000 mg | ORAL_TABLET | Freq: Every day | ORAL | 5 refills | Status: DC

## 2020-09-15 NOTE — Assessment & Plan Note (Signed)
Status post surgery

## 2020-09-15 NOTE — Patient Instructions (Signed)
It was very nice to see you today!  Please decrease your HCTZ to 12.5 mg daily.  Please keep an eye on your blood pressure recheck with me in a few weeks.  We will also start Celexa.  I will send an antibiotic for the spot on her leg.  I am concerned this may be a keratoacanthoma.  I will place an urgent referral to dermatology to have this removed.  Take care, Dr Jerline Pain  PLEASE NOTE:  If you had any lab tests please let us know if you have not heard back within a few days. You may see your results on mychart before we have a chance to review them but we will give you a call once they are reviewed by [place]. If we ordered any referrals today, please let us know if you have not heard from their office within the next week.   Please try these tips to maintain a healthy lifestyle:  Eat at least 3 REAL meals and 1-2 snacks per day.  Aim for no more than 5 hours between eating.  If you eat breakfast, please do so within one hour of getting up.   Each meal should contain half fruits/vegetables, one quarter protein, and one quarter carbs (no bigger than a computer mouse)  Cut down on sweet beverages. This includes juice, soda, and sweet tea.   Drink at least 1 glass of water with each meal and aim for at least 8 glasses per day  Exercise at least 150 minutes every week.

## 2020-09-15 NOTE — Assessment & Plan Note (Signed)
She is seeing endocrinology.  On Synthroid 50 mcg daily.

## 2020-09-15 NOTE — Progress Notes (Signed)
   Linda Knight is a 80 y.o. female who presents today for an office visit.  Assessment/Plan:  New/Acute Problems: Skin lesion Concern for keratoacanthoma versus basal cell carcinoma.  She is worried it may be infected.  We will start doxycycline and place urgent referral to dermatology.  Chronic Problems Addressed Today: Anxiety She did not respond well to Valium in the past.  We will start Celexa.  She will send a message on MyChart in a few days.  Essential hypertension She is slightly above goal though home readings have been at goal into the 120s to 140s.  We will continue her current dose of amlodipine-benazepril 5-10.  It is very possible her hypokalemia and hyponatremia could be due to the HCTZ.  We will decrease her dose to 12.5 mg daily and she will check with me in a few weeks.  We will likely need to repeat BMET in a few weeks.  She is following with endocrinology for her electrolyte issues as well.  Hypothyroidism She is seeing endocrinology.  On Synthroid 50 mcg daily.  Female bladder prolapse Status post surgery.     Subjective:  HPI:  Patient has several things that she would like to discuss today.  Last saw her about 10 months ago.  Since last we saw her she has underwent pelvic surgery for uterine prolapse.  She has had intermittent issues with constipation and diarrhea and has been taking MiraLAX to help regulate her bowels.  More recently she has had issues with blood pressure. She went to ED on 08/30/2020 for blood pressure issues.  They were very elevated at home.  She went to the ED.  Ended up having labs done which were significant for low potassium and low sodium.  Blood pressure normalized and she was discharged home.  She is currently having her low potassium and low sodium investigated by her endocrinologist.  In addition to this, she is interesting to start on Citalopram for her anxiety. She also complains of insect bite on the back side of her right  leg that been there for three weeks.. Associated symptoms are tenderness Denies fever, nausea, or chills.  She is concerned it may be infected.       Objective:  Physical Exam: BP (!) 161/89   Pulse 90   Temp 98.1 F (36.7 C) (Temporal)   Ht '5\' 4"'$  (1.626 m)   Wt 130 lb 12.8 oz (59.3 kg)   SpO2 100%   BMI 22.45 kg/m   Gen: No acute distress, resting comfortably CV: Regular rate and rhythm with no murmurs appreciated Pulm: Normal work of breathing, clear to auscultation bilaterally with no crackles, wheezes, or rhonchi Skin: Approximately 2 cm erythematous lesion with central ulceration on right posterior calf.  Neuro: Grossly normal, moves all extremities Psych: Normal affect and thought content       I,Savera Zaman,acting as a scribe for Dimas Chyle, MD.,have documented all relevant documentation on the behalf of Dimas Chyle, MD,as directed by  Dimas Chyle, MD while in the presence of Dimas Chyle, MD.  I, Dimas Chyle, MD, have reviewed all documentation for this visit. The documentation on 09/15/20 for the exam, diagnosis, procedures, and orders are all accurate and complete.  Algis Greenhouse. Jerline Pain, MD 09/15/2020 3:48 PM

## 2020-09-15 NOTE — Assessment & Plan Note (Signed)
She is slightly above goal though home readings have been at goal into the 120s to 140s.  We will continue her current dose of amlodipine-benazepril 5-10.  It is very possible her hypokalemia and hyponatremia could be due to the HCTZ.  We will decrease her dose to 12.5 mg daily and she will check with me in a few weeks.  We will likely need to repeat BMET in a few weeks.  She is following with endocrinology for her electrolyte issues as well.

## 2020-09-15 NOTE — Assessment & Plan Note (Signed)
She did not respond well to Valium in the past.  We will start Celexa.  She will send a message on MyChart in a few days.

## 2020-09-24 ENCOUNTER — Telehealth: Payer: Self-pay

## 2020-09-24 NOTE — Telephone Encounter (Signed)
LAST APPOINTMENT DATE:  09/15/20  NEXT APPOINTMENT DATE: None  MEDICATION:zolpidem (AMBIEN CR) 12.5 MG CR tablet  PHARMACY:[id number] - [place], [address] AT [place]

## 2020-09-24 NOTE — Telephone Encounter (Signed)
Rx sent to Dr. Parker for approval. 

## 2020-09-25 MED ORDER — ZOLPIDEM TARTRATE ER 12.5 MG PO TBCR: EXTENDED_RELEASE_TABLET | ORAL | 5 refills | Status: DC

## 2020-09-26 ENCOUNTER — Other Ambulatory Visit: Payer: Self-pay

## 2020-09-26 MED ORDER — ZOLPIDEM TARTRATE ER 12.5 MG PO TBCR: EXTENDED_RELEASE_TABLET | ORAL | 5 refills | Status: DC

## 2020-09-26 NOTE — Telephone Encounter (Signed)
Med request sent to PCP

## 2020-09-26 NOTE — Telephone Encounter (Signed)
Can Ambien please be sent to [facility]? I have changed pharmacy attached to refill request. I have cancelled script with [facility]

## 2020-09-26 NOTE — Telephone Encounter (Signed)
Patient wanted medication sent to     [facility] [phone] - [place], [address] AT [place] Phone:  [phone]  Fax:  [phone]

## 2020-09-30 ENCOUNTER — Telehealth: Payer: Self-pay

## 2020-09-30 NOTE — Telephone Encounter (Signed)
-----   Message from Yevette Edwards, RN sent at 10/01/2019 12:38 PM EDT ----- Regarding: MRI MRI Pancreas - 1 year recall

## 2020-10-01 NOTE — Telephone Encounter (Signed)
Left message on machine to call back  

## 2020-10-02 ENCOUNTER — Other Ambulatory Visit: Payer: Self-pay

## 2020-10-02 DIAGNOSIS — K862 Cyst of pancreas: Secondary | ICD-10-CM

## 2020-10-02 NOTE — Telephone Encounter (Signed)
I spoke with the pt and made her aware that she is due for repeat MRI for her pancreatic lesion.  She tell me that she is not interested in having an MRI at this time.  She does not like MRI machines.

## 2020-10-07 ENCOUNTER — Other Ambulatory Visit: Payer: No Typology Code available for payment source

## 2020-10-07 NOTE — Telephone Encounter (Signed)
Noted. Thanks.

## 2020-10-07 NOTE — Telephone Encounter (Signed)
I called and spoke with the pt and she declines any imaging at this time.  She does not wish to proceed with any further imaging- she will call back if she changes her mind.

## 2020-10-07 NOTE — Telephone Encounter (Signed)
She has an appt with Dr Tarri Glenn on 9/13 to follow up and discuss

## 2020-10-21 ENCOUNTER — Encounter: Payer: Self-pay | Admitting: Gastroenterology

## 2020-10-21 ENCOUNTER — Ambulatory Visit (INDEPENDENT_AMBULATORY_CARE_PROVIDER_SITE_OTHER): Payer: Medicare Other | Admitting: Gastroenterology

## 2020-10-21 ENCOUNTER — Other Ambulatory Visit (INDEPENDENT_AMBULATORY_CARE_PROVIDER_SITE_OTHER): Payer: Medicare Other

## 2020-10-21 VITALS — BP 140/70 | HR 80 | Ht 64.0 in | Wt 128.1 lb

## 2020-10-21 DIAGNOSIS — R194 Change in bowel habit: Secondary | ICD-10-CM

## 2020-10-21 DIAGNOSIS — K862 Cyst of pancreas: Secondary | ICD-10-CM

## 2020-10-21 LAB — COMPREHENSIVE METABOLIC PANEL
ALT: 17 U/L (ref 0–35)
AST: 20 U/L (ref 0–37)
Albumin: 4.4 g/dL (ref 3.5–5.2)
Alkaline Phosphatase: 69 U/L (ref 39–117)
BUN: 15 mg/dL (ref 6–23)
CO2: 29 mEq/L (ref 19–32)
Calcium: 9.8 mg/dL (ref 8.4–10.5)
Chloride: 98 mEq/L (ref 96–112)
Creatinine, Ser: 1.01 mg/dL (ref 0.40–1.20)
GFR: 53.24 mL/min — ABNORMAL LOW (ref >60.00)
Glucose, Bld: 99 mg/dL (ref 70–99)
Potassium: 4.2 mEq/L (ref 3.5–5.1)
Sodium: 136 mEq/L (ref 135–145)
Total Bilirubin: 0.5 mg/dL (ref 0.2–1.2)
Total Protein: 7.7 g/dL (ref 6.0–8.3)

## 2020-10-21 MED ORDER — DIAZEPAM 5 MG PO TABS
ORAL_TABLET | ORAL | 0 refills | Status: DC
Start: 2020-10-21 — End: 2020-12-09

## 2020-10-21 NOTE — Progress Notes (Signed)
While discharging pt, she mentioned being scheduled for MRI/MRCP and did not need further scheduling instructions. However, after pt left, both Dr. Tarri Glenn and I noticed that pt is NOT scheduled at a [facility] facility. Called pt to inquire further. States she was scheduled in [place] and order was completed by another provider. After further discussion, pt decided she will cancel appt at [facility] and requested I send scheduling a message for her to be scheduled at a [facility] location. Below is the message she to Radiology Scheduling:  Following message sent to Rhys Martini and April Pait:  [facility] Phone: [phone] Fax: [phone]   Imaging Ordered: MRI/MRCP  Diagnosis: Pancreatic cysts  Ordering Provider: Dr. Tarri Glenn  Is a Prior Authorization needed? We are in the process of obtaining it now  Is the patient Diabetic? No  Does the patient have Hypertension? No  Does the patient have any implanted devices or hardware? No  Date of last BUN/Creat, if needed? Obtained today  Patient Weight? 128#  Is the patient able to get on the table? Yes  Has the patient been diagnosed with COVID? No  Is the patient waiting on COVID testing results? No  Thank you for your assistance! [facility]

## 2020-10-21 NOTE — Patient Instructions (Signed)
It was my pleasure to provide care to you today. Based on our discussion, I am providing you with my recommendations below:  RECOMMENDATION(S):   IMAGING:  You will be contacted by [facility] Scheduling (Your caller ID will indicate phone # [phone]) within the next business 7-10 business days to schedule your MRI/MRCP. If you have not heard from them within 7-10 business days, please call [place] at [phone] to follow up on the status of your appointment.     COLONOSCOPY:   You have been scheduled for a colonoscopy. Please follow written instructions given to you at your visit today.   PREP:   Please pick up your prep supplies at the pharmacy within the next 1-3 days.  INHALERS:   If you use inhalers (even only as needed), please bring them with you on the day of your procedure.  COLONOSCOPY TIPS:  To reduce nausea and dehydration, stay well hydrated for 3-4 days prior to the exam.  To prevent skin/hemorrhoid irritation - prior to wiping, put A&Dointment or vaseline on the toilet paper. Keep a towel or pad on the bed.  BEFORE STARTING YOUR PREP, drink  64oz of clear liquids in the morning. This will help to flush the colon and will ensure you are well hydrated!!!!  NOTE - This is in addition to the fluids required for to complete your prep. Use of a flavored hard candy, such as grape Anise Salvo, can counteract some of the flavor of the prep and may prevent some nausea.   PRESCRIPTION MEDICATION(S):   We have sent the following medication(s) to your pharmacy:  Valium  NOTE: If your medication(s) requires a PRIOR AUTHORIZATION, we will receive notification from your pharmacy. Once received, the process to submit for approval may take up to 7-10 business days. You will be contacted about any denials we have received from your insurance company as well as alternatives recommended by your provider.  LABS:   Please proceed to the basement  level for lab work before leaving today. Press "B" on the elevator. The lab is located at the first door on the left as you exit the elevator.  HEALTHCARE LAWS AND MY CHART RESULTS:   Due to recent changes in healthcare laws, you may see results of your imaging and/or laboratory studies on MyChart before I have had a chance to review them.  I understand that in some cases there may be results that are confusing or concerning to you. Please understand that not all results are received at same time and often I may need to interpret multiple results in order to provide you with the best plan of care or course of treatment. Therefore, I ask that you please give me 48 hours to thoroughly review all your results before contacting my office for clarification.   FOLLOW UP:  After your procedure, you will receive a call from my office staff regarding my recommendation for follow up.  BMI:  If you are age 79 or older, your body mass index should be between 23-30. Your Body mass index is 21.99 kg/m. If this is out of the aforementioned range listed, please consider follow up with your Primary Care Provider.  MY CHART:  The [facility] providers would like to encourage you to use [facility] to communicate with providers for non-urgent requests or questions.  Due to long hold times on the telephone, sending your provider a message by [facility] may be a faster and more efficient way  to get a response.  Please allow 48 business hours for a response.  Please remember that this is for non-urgent requests.   Thank you for trusting me with your gastrointestinal care!    Thornton Park, MD, MPH

## 2020-10-21 NOTE — Progress Notes (Signed)
Referring Provider: Vivi Barrack, MD Primary Care Physician:  Vivi Barrack, MD  Chief complaint:  Change in bowel habits, pancreatic cyst on CT   IMPRESSION:  Altered bowel habits following colpopexy and bladder repair 10/21 Pancreatic cyst and dilated pancreatic duct on recent CT and MRI    - normal pancreas on CT in [facility] in 2019    - Abnormal pancreas on CT 07/05/19    - Abnormal pancreas on MRI 08/10/19    - EGD/EUS 09/27/19: small 5-36m pancreatic cysts (body and head). No solid lesions. Nl PD.  Periampullary duodenal diverticulum Elevated lipase 07/2019 Recent abdominal pain, resolved with dietary changes Daily alcohol use, with history of heavier use Glucose intolerance Chronic constipation treated with Miralax and magnesium Adenomyomatosis of the gallbladder fundus. [facility] Former smoker  Altered bowel habits occurring after surgery: Broad differential including SIBO, chronic infections, pancreatic insufficiency, carbohydrate intolerance and functional sources. Colonoscopy with random biopsies recommended. Add a daily stool bulking agent in the meantime. Low threshold for empiric antibiotics for SIBO.   Abnormal pancreas on imaging: Although unlikely given EUS results, must consider pancreatic abnormalities as the cause of her altered bowel habits. Repeat MRI/MRCP was already planned for this month.    PLAN: - Pancreatic elastase - Use Metamucil or Citrucel QD, increasing to BID after 7 days if no improvement - MRI/MRCP for further evaluation of the pancreatic cysts    - requesting premedication with Valium 5 mg which she used in the past - Colonoscopy with random biopsies - [facility] - Consider Xifaxan 550 mg TID x 14 days if symptoms persist  I spent over 30 minutes, including in depth chart review, independent review of results, communicating results with  the patient directly, face-to-face time with the patient, coordinating care, and ordering studies and medications as appropriate, and documentation.    HPI: JLESHONDA MILKis a 80y.o. female returns in follow-up. She was last seen in the office 09/10/19 and had an EGD with EUS with Dr. JArdis Hughs8/19/21. She was initially  referred by Dr. PJerline Painfor further evaluation of abdominal pain and abnormal pancreas imaging.  Pain resolved with dietary changes to avoid alcohol, dairy, fried foods, greasy foods, spicy foods. The interval history is obtained through the patient. She has anemia, arthritis, hypercholesterolemia, hypertension, thyroid disease, glucose intolerance and sleep apnea. She is a retired aWeb designer   CT scan with and without contrast 07/05/19: pancreatic atrophy with focal distension of the main pancreatic duct to approximately 638m Peripheral pancreatic duct is nondilated. Subtle low attenuation in the head of the pancreas extending towards the atrophied uncinate measuring 29m629mBaseline density not clearly cystic but small.  Mild bilteral ureteral distension without obstructing lesion. Small kidney cysts.   MRI of the abdomen 08/10/19: small lesions in the pancreatic head show ductal communication. Main pancreat duct dilated to just at or above 5mm74mdditional 6mm 28mus in the body of the pancreas. Adenomyomatosis of the gallbladder fundus.  EGD/EUS 09/27/19: gastritis, two small pancreatic cysts (body and head) measuring 5-6mm. 42msolid lesions in the pancreas. Normal pancreatic duct. No LAD. Periampullary duodenal diverticulum.   Labs 05/03/19: normal liver enzymes Labs 07/17/19 lipase 111, amylase 72 Labs 08/15/19: normal liver enzymes  [place] No history of pancreatitis or pancreatic cancer.   No family history of pancreatitis or  pancreatitic cancer.   Longstanding history of constipation. Worsened by recent  GYN. Uses Metamucil, magnesium powder, as needed.  No diarrhea. No blood or mucous.   Longstanding history of constipation treated with Metamucil and magnesium PRN. Constipation worsened after sacral colpopexy and laparoscopic bladder repair 11/2019. Recently called the office with worsening constipation despite increasing fiber and water intake in addition to Miralax QD. But, constipation has since been replaced with loose stools. Associated urgency with concerns for accidents. Wearing pads 24/7. She thinks she may have had one episode of nocturnal stooling. No blood or mucous. Bowel movements can be formed or loose. No associated abdominal pain.  Uses Benefiber or Miralax when she feels constipation might be coming on. She is finding regulating her bowels very difficulty.   In the last couple of weeks her symptoms may be leveling out.    Colonoscopy in 2019 in [place] with Dr. Julio Sicks. Report not available although the patient has photos of the cecum in her copies of medical records.   Past Medical History:  Diagnosis Date   Anemia    Arthritis    CAD (coronary artery disease)    High cholesterol    Hypertension    Pancreatic cyst    SCCA (squamous cell carcinoma) of skin 2019   Right Arm [facility])   Sleep apnea    Squamous cell carcinoma of skin 2019   Left Leg [facility])   Thyroid disease     Past Surgical History:  Procedure Laterality Date   ABDOMINAL HYSTERECTOMY     APPENDECTOMY     BIOPSY  09/27/2019   Procedure: BIOPSY;  Surgeon: Milus Banister, MD;  Location: [facility];  Service: Endoscopy;;   BREAST EXCISIONAL BIOPSY Right    BUNIONECTOMY     carcinoma removed Right 09/2020   CYSTOCELE REPAIR     bladder repair   ESOPHAGOGASTRODUODENOSCOPY (EGD) WITH PROPOFOL N/A 09/27/2019   Procedure: ESOPHAGOGASTRODUODENOSCOPY (EGD) WITH PROPOFOL;  Surgeon: Milus Banister, MD;  Location: [facility];  Service: Endoscopy;  Laterality: N/A;   EUS N/A 09/27/2019    Procedure: UPPER ENDOSCOPIC ULTRASOUND (EUS) RADIAL;  Surgeon: Milus Banister, MD;  Location: [facility];  Service: Endoscopy;  Laterality: N/A;   TYMPANOPLASTY Left 06/2020   VAGINAL PROLAPSE REPAIR  12/07/2019    Current Outpatient Medications  Medication Sig Dispense Refill   amLODipine-benazepril (LOTREL) 5-10 MG capsule Take 1 capsule by mouth daily. 90 capsule 3   aspirin EC 81 MG tablet Take 81 mg by mouth daily. Swallow whole.     atorvastatin (LIPITOR) 20 MG tablet Take 1 tablet (20 mg total) by mouth daily. 90 tablet 2   citalopram (CELEXA) 10 MG tablet Take 1 tablet (10 mg total) by mouth daily. 30 tablet 5   conjugated estrogens (PREMARIN) vaginal cream Place 1 Applicatorful vaginally once a week.      diazepam (VALIUM) 5 MG tablet TAKE 45 MINUTES PRIOR TO MRI 1 tablet 0   doxycycline (VIBRA-TABS) 100 MG tablet Take 1 tablet (100 mg total) by mouth 2 (two) times daily. 14 tablet 0   estrogens, conjugated, (PREMARIN) 0.3 MG tablet Take 0.3 mg by mouth every Monday, Wednesday, and Friday.      hydrochlorothiazide (HYDRODIURIL) 25 MG tablet Take 0.5 tablets (12.5 mg total) by mouth daily. 90 tablet 3   Multiple Vitamin (MULTIVITAMIN WITH MINERALS) TABS tablet Take 1 tablet by mouth daily.     potassium chloride SA (KLOR-CON) 20 MEQ tablet Take 1 tablet (  20 mEq total) by mouth daily. (Patient taking differently: Take 20 mEq by mouth daily. Taking 1/2 tablet daily) 90 tablet 1   Propylene Glycol 0.6 % SOLN 1 drop.     Vitamin D, Cholecalciferol, 25 MCG (1000 UT) CAPS Take 1 capsule by mouth daily. 90 capsule 3   zolpidem (AMBIEN CR) 12.5 MG CR tablet TAKE 1 TABLET(12.5 MG) BY MOUTH AT BEDTIME AS NEEDED FOR SLEEP 30 tablet 5   levothyroxine (SYNTHROID) 50 MCG tablet Take 1 tablet (50 mcg total) by mouth daily. 90 tablet 3   No current facility-administered medications for this visit.    Allergies as of 10/21/2020 - Review Complete 10/21/2020  Allergen Reaction Noted   Tape  Rash 09/29/2020    Family History  Problem Relation Age of Onset   Hyperlipidemia Mother    Hypertension Mother    Stroke Mother    Lung disease Father    Heart disease Brother    Colon cancer Neg Hx       Physical Exam: General:   Alert,  well-nourished, pleasant and cooperative in NAD Head:  Normocephalic and atraumatic. Eyes:  Sclera clear, no icterus.   Conjunctiva pink. Abdomen:  Soft, nontender, nondistended, normal bowel sounds, no rebound or guarding. No hepatosplenomegaly.   Rectal:  Deferred  Msk:  Symmetrical. No boney deformities LAD: No inguinal or umbilical LAD Extremities:  No clubbing or edema. Neurologic:  Alert and  oriented x4;  grossly nonfocal Skin:  Intact without significant lesions or rashes. Psych:  Alert and cooperative. Normal mood and affect.    Hassel Uphoff L. Tarri Glenn, MD, MPH 10/23/2020, 3:22 PM

## 2020-10-21 NOTE — Progress Notes (Signed)
Appointment Information  Name: Sahniya, Henneman MRN: [id number]  Date: 10/30/2020 Status: Sch  Time: 10:00 AM Length: 60  Visit Type: MR ABD W/WO CM/MRCP [id number] Copay: $0.00  Provider: [facility] Department: [facility]  Referring Provider: Thornton Park CSN: [id number]  Notes: sch with pt, arrival 930a/NPO  Made On: 10/21/2020 11:13 AM By: Ramonita Lab

## 2020-10-22 ENCOUNTER — Other Ambulatory Visit: Payer: Self-pay

## 2020-10-22 ENCOUNTER — Encounter: Payer: Self-pay | Admitting: Family Medicine

## 2020-10-22 DIAGNOSIS — E039 Hypothyroidism, unspecified: Secondary | ICD-10-CM

## 2020-10-22 MED ORDER — LEVOTHYROXINE SODIUM 50 MCG PO TABS: 50.0000 ug | ORAL_TABLET | Freq: Every day | ORAL | 3 refills | Status: DC

## 2020-10-23 ENCOUNTER — Other Ambulatory Visit: Payer: Medicare Other

## 2020-10-23 DIAGNOSIS — K862 Cyst of pancreas: Secondary | ICD-10-CM

## 2020-10-23 DIAGNOSIS — R194 Change in bowel habit: Secondary | ICD-10-CM

## 2020-10-28 NOTE — Progress Notes (Signed)
Records Request   Provider and/or Facility: [facility] Document: Signed ROI   ROI has been faxed successfully to [facility]. Document(s) and fax confirmation(s) have been placed in the faxed file for future reference.

## 2020-10-29 LAB — PANCREATIC ELASTASE, FECAL: Pancreatic Elastase-1, Stool: 42 mcg/g — ABNORMAL LOW

## 2020-10-30 ENCOUNTER — Ambulatory Visit (HOSPITAL_COMMUNITY)
Admission: RE | Admit: 2020-10-30 | Discharge: 2020-10-30 | Disposition: A | Discharge status: Home / Self Care | Payer: Medicare Other | Source: Ambulatory Visit | Attending: Gastroenterology | Admitting: Gastroenterology

## 2020-10-30 ENCOUNTER — Other Ambulatory Visit: Payer: Self-pay | Admitting: Gastroenterology

## 2020-10-30 ENCOUNTER — Other Ambulatory Visit: Payer: Self-pay

## 2020-10-30 DIAGNOSIS — R194 Change in bowel habit: Secondary | ICD-10-CM | POA: Insufficient documentation

## 2020-10-30 DIAGNOSIS — K862 Cyst of pancreas: Secondary | ICD-10-CM | POA: Diagnosis present

## 2020-10-30 MED ORDER — GADOBUTROL 1 MMOL/ML IV SOLN
6.0000 mL | Freq: Once | INTRAVENOUS | Status: AC | PRN
  Administered 2020-10-30: 6 mL via INTRAVENOUS

## 2020-11-03 ENCOUNTER — Encounter: Payer: Self-pay | Admitting: Gastroenterology

## 2020-11-03 ENCOUNTER — Other Ambulatory Visit: Payer: Self-pay

## 2020-11-03 ENCOUNTER — Ambulatory Visit (AMBULATORY_SURGERY_CENTER): Payer: Medicare Other | Admitting: Gastroenterology

## 2020-11-03 VITALS — BP 137/71 | HR 60 | Temp 97.8°F | Resp 12 | Ht 64.0 in | Wt 128.0 lb

## 2020-11-03 DIAGNOSIS — K5730 Diverticulosis of large intestine without perforation or abscess without bleeding: Secondary | ICD-10-CM

## 2020-11-03 DIAGNOSIS — K862 Cyst of pancreas: Secondary | ICD-10-CM

## 2020-11-03 DIAGNOSIS — K6389 Other specified diseases of intestine: Secondary | ICD-10-CM | POA: Diagnosis not present

## 2020-11-03 DIAGNOSIS — K8689 Other specified diseases of pancreas: Secondary | ICD-10-CM

## 2020-11-03 DIAGNOSIS — K648 Other hemorrhoids: Secondary | ICD-10-CM | POA: Diagnosis not present

## 2020-11-03 DIAGNOSIS — R194 Change in bowel habit: Secondary | ICD-10-CM | POA: Diagnosis not present

## 2020-11-03 MED ORDER — SODIUM CHLORIDE 0.9 % IV SOLN: 500.0000 mL | Freq: Once | INTRAVENOUS | Status: DC

## 2020-11-03 MED ORDER — CREON 24000-76000 UNITS PO CPEP: 24000.0000 [IU] | ORAL_CAPSULE | Freq: Three times a day (TID) | ORAL | 2 refills | Status: DC

## 2020-11-03 NOTE — Patient Instructions (Signed)
Handouts given on diverticulosis and hemorrhoids. Await pathology results.   YOU HAD AN ENDOSCOPIC PROCEDURE TODAY AT [facility]:   Refer to the procedure report that was given to you for any specific questions about what was found during the examination.  If the procedure report does not answer your questions, please call your gastroenterologist to clarify.  If you requested that your care partner not be given the details of your procedure findings, then the procedure report has been included in a sealed envelope for you to review at your convenience later.  YOU SHOULD EXPECT: Some feelings of bloating in the abdomen. Passage of more gas than usual.  Walking can help get rid of the air that was put into your GI tract during the procedure and reduce the bloating. If you had a lower endoscopy (such as a colonoscopy or flexible sigmoidoscopy) you may notice spotting of blood in your stool or on the toilet paper. If you underwent a bowel prep for your procedure, you may not have a normal bowel movement for a few days.  Please Note:  You might notice some irritation and congestion in your nose or some drainage.  This is from the oxygen used during your procedure.  There is no need for concern and it should clear up in a day or so.  SYMPTOMS TO REPORT IMMEDIATELY:  Following lower endoscopy (colonoscopy or flexible sigmoidoscopy):  Excessive amounts of blood in the stool  Significant tenderness or worsening of abdominal pains  Swelling of the abdomen that is new, acute  Fever of 100F or higher  For urgent or emergent issues, a gastroenterologist can be reached at any hour by calling [phone]. Do not use MyChart messaging for urgent concerns.    DIET:  We do recommend a small meal at first, but then you may proceed to your regular diet.  Drink plenty of fluids but you should avoid alcoholic beverages for 24 hours.  ACTIVITY:  You should plan to take it easy for the rest of  today and you should NOT DRIVE or use heavy machinery until tomorrow (because of the sedation medicines used during the test).    FOLLOW UP: Our staff will call the number listed on your records 48-72 hours following your procedure to check on you and address any questions or concerns that you may have regarding the information given to you following your procedure. If we do not reach you, we will leave a message.  We will attempt to reach you two times.  During this call, we will ask if you have developed any symptoms of COVID 19. If you develop any symptoms (ie: fever, flu-like symptoms, shortness of breath, cough etc.) before then, please call [phone].  If you test positive for Covid 19 in the 2 weeks post procedure, please call and report this information to [place].    If any biopsies were taken you will be contacted by phone or by letter within the next 1-3 weeks.  Please call us at [phone] if you have not heard about the biopsies in 3 weeks.    SIGNATURES/CONFIDENTIALITY: You and/or your care partner have signed paperwork which will be entered into your electronic medical record.  These signatures attest to the fact that that the information above on your After Visit Summary has been reviewed and is understood.  Full responsibility of the confidentiality of this discharge information lies with you and/or your care-partner.

## 2020-11-03 NOTE — Op Note (Addendum)
[facility] Patient Name: Linda Knight Procedure Date: 11/03/2020 3:21 PM MRN: [id number] Endoscopist: Thornton Park MD, MD Age: 80 Referring MD:  Date of Birth: 05/16/1940 Gender: Female Account #: [id number] Procedure:                Colonoscopy Indications:              Altered bowel habits following colpopexy and                            bladder repair 10/21                           Prior colonoscopy in 2019 in [place]:                Monitored Anesthesia Care Procedure:                Pre-Anesthesia Assessment:                           - Prior to the procedure, a History and Physical                            was performed, and patient medications and                            allergies were reviewed. The patient's tolerance of                            previous anesthesia was also reviewed. The risks                            and benefits of the procedure and the sedation                            options and risks were discussed with the patient.                            All questions were answered, and informed consent                            was obtained. Prior Anticoagulants: The patient has                            taken no previous anticoagulant or antiplatelet                            agents. ASA Grade Assessment: III - A patient with                            severe systemic disease. After reviewing the risks                            and benefits, the patient was deemed in  satisfactory condition to undergo the procedure.                           After obtaining informed consent, the colonoscope                            was passed under direct vision. Throughout the                            procedure, the patient's blood pressure, pulse, and                            oxygen saturations were monitored continuously. The                            PCF-HQ190L Colonoscope was introduced through  the                            anus and advanced to the 3 cm into the ileum. The                            colonoscopy was performed without difficulty. The                            patient tolerated the procedure well. The quality                            of the bowel preparation was good. The terminal                            ileum, ileocecal valve, appendiceal orifice, and                            rectum were photographed. Scope In: 3:33:11 PM Scope Out: 3:44:48 PM Scope Withdrawal Time: 0 hours 7 minutes 4 seconds  Total Procedure Duration: 0 hours 11 minutes 37 seconds  Findings:                 The colon (entire examined portion) appeared                            normal. Biopsies for histology were taken with a                            cold forceps from the right colon and left colon                            for evaluation of microscopic colitis. Estimated                            blood loss was minimal.                           A few small-mouthed diverticula were found in the  sigmoid colon and descending colon.                           Non-bleeding internal hemorrhoids were found.                           The exam was otherwise without abnormality on                            direct and retroflexion views. Complications:            No immediate complications. Estimated blood loss:                            Minimal. Estimated Blood Loss:     Estimated blood loss was minimal. Impression:               - The entire examined colon is normal. Biopsied.                           - Diverticulosis in the sigmoid colon and in the                            descending colon.                           - Non-bleeding internal hemorrhoids.                           - The examination was otherwise normal on direct                            and retroflexion views. Recommendation:           - Patient has a contact number available for                             emergencies. The signs and symptoms of potential                            delayed complications were discussed with the                            patient. Return to normal activities tomorrow.                            Written discharge instructions were provided to the                            patient.                           - Resume previous diet.                           - Continue present medications.                           -  Await pathology results.                           - Emerging evidence supports eating a diet of                            fruits, vegetables, grains, calcium, and yogurt                            while reducing red meat and alcohol may reduce the                            risk of colon cancer.                           - No routine surveillance colonoscopy recommended                            due to age over 64, Thornton Park MD, MD 11/03/2020 3:50:34 PM This report has been signed electronically.

## 2020-11-03 NOTE — Progress Notes (Signed)
PT taken to PACU. Monitors in place. VSS. Report given to RN. 

## 2020-11-03 NOTE — Progress Notes (Signed)
Pt's states no medical or surgical changes since previsit or office visit. VS assessed by D.T 

## 2020-11-03 NOTE — Progress Notes (Signed)
Referring Provider: Vivi Barrack, MD Primary Care Physician:  Vivi Barrack, MD  Reason for Procedure:  Change in bowel habits   IMPRESSION:  Change in bowel habits Appropriate candidate for monitored anesthesia care  PLAN: Colonoscopy in the [place] today   HPI: Linda Knight is a 80 y.o. female presents for colonoscopy to evaluate a change in bowel habits. Please see office visit note from 10/21/20 for full details. No change in history or PE.     Past Medical History:  Diagnosis Date   Anemia    Arthritis    CAD (coronary artery disease)    High cholesterol    Hypertension    Pancreatic cyst    SCCA (squamous cell carcinoma) of skin 2019   Right Arm [facility])   Sleep apnea    Squamous cell carcinoma of skin 2019   Left Leg [facility])   Thyroid disease     Past Surgical History:  Procedure Laterality Date   ABDOMINAL HYSTERECTOMY     APPENDECTOMY     BIOPSY  09/27/2019   Procedure: BIOPSY;  Surgeon: Milus Banister, MD;  Location: [facility];  Service: Endoscopy;;   BREAST EXCISIONAL BIOPSY Right    BUNIONECTOMY     carcinoma removed Right 09/2020   CYSTOCELE REPAIR     bladder repair   ESOPHAGOGASTRODUODENOSCOPY (EGD) WITH PROPOFOL N/A 09/27/2019   Procedure: ESOPHAGOGASTRODUODENOSCOPY (EGD) WITH PROPOFOL;  Surgeon: Milus Banister, MD;  [facility]: [facility];  Service: Endoscopy;  Laterality: N/A;   EUS N/A 09/27/2019   Procedure: UPPER ENDOSCOPIC ULTRASOUND (EUS) RADIAL;  Surgeon: Milus Banister, MD;  Location: [facility];  Service: Endoscopy;  Laterality: N/A;   TYMPANOPLASTY Left 06/2020   VAGINAL PROLAPSE REPAIR  12/07/2019    Current Outpatient Medications  Medication Sig Dispense Refill   amLODipine-benazepril (LOTREL) 5-10 MG capsule Take 1 capsule by mouth daily. 90 capsule 3   aspirin EC 81 MG tablet Take 81 mg by mouth daily. Swallow whole.     atorvastatin (LIPITOR) 20 MG tablet Take 1 tablet (20 mg total) by mouth daily. 90  tablet 2   citalopram (CELEXA) 10 MG tablet Take 1 tablet (10 mg total) by mouth daily. 30 tablet 5   conjugated estrogens (PREMARIN) vaginal cream Place 1 Applicatorful vaginally once a week.      diazepam (VALIUM) 5 MG tablet TAKE 45 MINUTES PRIOR TO MRI 1 tablet 0   doxycycline (VIBRA-TABS) 100 MG tablet Take 1 tablet (100 mg total) by mouth 2 (two) times daily. 14 tablet 0   estrogens, conjugated, (PREMARIN) 0.3 MG tablet Take 0.3 mg by mouth every Monday, Wednesday, and Friday.      hydrochlorothiazide (HYDRODIURIL) 25 MG tablet Take 0.5 tablets (12.5 mg total) by mouth daily. 90 tablet 3   levothyroxine (SYNTHROID) 50 MCG tablet Take 1 tablet (50 mcg total) by mouth daily. 90 tablet 3   Multiple Vitamin (MULTIVITAMIN WITH MINERALS) TABS tablet Take 1 tablet by mouth daily.     potassium chloride SA (KLOR-CON) 20 MEQ tablet Take 1 tablet (20 mEq total) by mouth daily. (Patient taking differently: Take 20 mEq by mouth daily. Taking 1/2 tablet daily) 90 tablet 1   Propylene Glycol 0.6 % SOLN 1 drop.     Vitamin D, Cholecalciferol, 25 MCG (1000 UT) CAPS Take 1 capsule by mouth daily. 90 capsule 3   zolpidem (AMBIEN CR) 12.5 MG CR tablet TAKE 1 TABLET(12.5 MG) BY MOUTH AT BEDTIME AS NEEDED  FOR SLEEP 30 tablet 5   Current Facility-Administered Medications  Medication Dose Route Frequency Provider Last Rate Last Admin   0.9 %  sodium chloride infusion  500 mL Intravenous Once Thornton Park, MD        Allergies as of 11/03/2020 - Review Complete 10/21/2020  Allergen Reaction Noted   Tape Rash 09/29/2020    Family History  Problem Relation Age of Onset   Hyperlipidemia Mother    Hypertension Mother    Stroke Mother    Lung disease Father    Heart disease Brother    Colon cancer Neg Hx      Physical Exam: General:   Alert,  well-nourished, pleasant and cooperative in NAD Head:  Normocephalic and atraumatic. Eyes:  Sclera clear, no icterus.   Conjunctiva pink. Mouth:  No  deformity or lesions.   Neck:  Supple; no masses or thyromegaly. Lungs:  Clear throughout to auscultation.   No wheezes. Heart:  Regular rate and rhythm; no murmurs. Abdomen:  Soft, non-tender, nondistended, normal bowel sounds, no rebound or guarding.  Msk:  Symmetrical. No boney deformities LAD: No inguinal or umbilical LAD Extremities:  No clubbing or edema. Neurologic:  Alert and  oriented x4;  grossly nonfocal Skin:  No obvious rash or bruise. Psych:  Alert and cooperative. Normal mood and affect.     Ragina Fenter L. Tarri Glenn, MD, MPH 11/03/2020, 2:58 PM

## 2020-11-05 ENCOUNTER — Telehealth: Payer: Self-pay | Admitting: *Deleted

## 2020-11-05 ENCOUNTER — Telehealth: Payer: Self-pay

## 2020-11-05 NOTE — Telephone Encounter (Signed)
  Follow up Call-  Call back number 11/03/2020  Post procedure Call Back phone  # [phone]  Permission to leave phone message Yes     Patient questions:  Message left to call us if necessary.

## 2020-11-05 NOTE — Telephone Encounter (Signed)
  Follow up Call-  Call back number 11/03/2020  Post procedure Call Back phone  # [phone]  Permission to leave phone message Yes     Patient questions:  Do you have a fever, pain , or abdominal swelling? No. Pain Score  0 *  Have you tolerated food without any problems? Yes.    Have you been able to return to your normal activities? Yes.    Do you have any questions about your discharge instructions: Diet   No. Medications  No. Follow up visit  No.  Do you have questions or concerns about your Care? No.  Actions: * If pain score is 4 or above: No action needed, pain <4.   Pt reported she was still "gasy" after eating food.  She said she did not have pain.  I advised to call us back if she becomes uncomfortable.     Have you developed a fever since your procedure? no  2.   Have you had an respiratory symptoms (SOB or cough) since your procedure? no  3.   Have you tested positive for COVID 19 since your procedure no  4.   Have you had any family members/close contacts diagnosed with the COVID 19 since your procedure?  no   If yes to any of these questions please route to Joylene John, RN and Joella Prince, RN

## 2020-11-05 NOTE — Progress Notes (Signed)
Records Request - SECOND REQUEST   Provider and/or Facility: [facility] Document: Signed ROI   ROI has been faxed successfully to [facility]. Document(s) and fax confirmation(s) have been placed in the faxed file for future reference.

## 2020-11-07 ENCOUNTER — Encounter: Payer: Self-pay | Admitting: Gastroenterology

## 2020-11-13 NOTE — Progress Notes (Signed)
Records Request - FINAL REQUEST   Provider and/or Facility: [facility] Document: Signed ROI   ROI has been faxed successfully to [facility]. Document(s) and fax confirmation(s) have been placed in the faxed file for future reference.

## 2020-11-14 ENCOUNTER — Encounter: Payer: Self-pay | Admitting: Gastroenterology

## 2020-11-14 ENCOUNTER — Other Ambulatory Visit: Payer: Medicare Other

## 2020-11-14 ENCOUNTER — Ambulatory Visit (INDEPENDENT_AMBULATORY_CARE_PROVIDER_SITE_OTHER): Payer: Medicare Other | Admitting: Gastroenterology

## 2020-11-14 VITALS — BP 128/70 | HR 68 | Ht 64.0 in | Wt 130.0 lb

## 2020-11-14 DIAGNOSIS — R194 Change in bowel habit: Secondary | ICD-10-CM

## 2020-11-14 DIAGNOSIS — R109 Unspecified abdominal pain: Secondary | ICD-10-CM | POA: Diagnosis not present

## 2020-11-14 DIAGNOSIS — K8689 Other specified diseases of pancreas: Secondary | ICD-10-CM | POA: Diagnosis not present

## 2020-11-14 DIAGNOSIS — K862 Cyst of pancreas: Secondary | ICD-10-CM | POA: Diagnosis not present

## 2020-11-14 MED ORDER — RIFAXIMIN 550 MG PO TABS: 550.0000 mg | ORAL_TABLET | Freq: Three times a day (TID) | ORAL | 0 refills | Status: AC

## 2020-11-14 NOTE — Progress Notes (Signed)
Referring Provider: Vivi Barrack, MD Primary Care Physician:  Vivi Barrack, MD  Chief complaint:  Change in bowel habits, pancreatic cyst on CT   IMPRESSION:  Altered bowel habits following colpopexy and bladder repair 10/21 Pancreatic cyst and dilated pancreatic duct on recent CT and MRI    - normal pancreas on CT in [facility] in 2019    - Abnormal pancreas on CT 07/05/19    - Abnormal pancreas on MRI 08/10/19    - EGD/EUS 09/27/19:        - small 5-60mm pancreatic cysts (body and head). No solid lesions. Nl PD.     - MRI/MRCP 10/30/20: no change Periampullary duodenal diverticulum Elevated lipase 07/2019 Recent abdominal pain, resolved with dietary changes Daily alcohol use, with history of heavier use Glucose intolerance Chronic constipation treated with Miralax and magnesium Adenomyomatosis of the gallbladder fundus. Prior screening colonoscopy in [facility]  Former smoker  Altered bowel habits occurring after surgery: Not explained by colonoscopy, although biopsies showed edema. Continue Creon and daily stool bulking agent in the meantime. Low threshold for empiric antibiotics for SIBO particularly given duodenal diverticulum.   Abnormal pancreas on imaging:  Repeat MRI/MRCP overall stable. Weight has increased 2 pounds. Repeat imaging in 1 year, earlier with new symptoms.  PLAN: - Pancreatic elastase one week before next appointment - Continue Creon at current doses - Use Metamucil or Citrucel QD, increasing to BID after 7 days if no improvement - MRI/MRCP for further evaluation of the pancreatic cysts 9/23 -  Xifaxan 550 mg TID x 14 days if symptoms persist - Follow-up in 4-6 weeks, earlier if needed  I spent over 30 minutes, including in depth chart review, independent review of results, communicating results with the patient directly, face-to-face time with the patient, coordinating care, and ordering studies and medications as appropriate, and documentation.     HPI: Linda Knight is a 79 y.o. female returns in follow-up. She was last seen in the office 11/03/20.   She was initially  referred by Dr. Jerline Pain for further evaluation of abdominal pain and abnormal pancreas imaging.  Pain resolved with dietary changes to avoid alcohol, dairy, fried foods, greasy foods, spicy foods.   CT scan with and without contrast 07/05/19: pancreatic atrophy with focal distension of the main pancreatic duct to approximately 2mm. Peripheral pancreatic duct is nondilated. Subtle low attenuation in the head of the pancreas extending towards the atrophied uncinate measuring 32mm. Baseline density not clearly cystic but small.  Mild bilteral ureteral distension without obstructing lesion. Small kidney cysts.   MRI of the abdomen 08/10/19: small lesions in the pancreatic head show ductal communication. Main pancreat duct dilated to just at or above 76mm. Additional 103mm focus in the body of the pancreas. Adenomyomatosis of the gallbladder fundus.  EGD/EUS 09/27/19: gastritis, two small pancreatic cysts (body and head) measuring 5-50mm. No solid lesions in the pancreas. Normal pancreatic duct. No LAD. Periampullary duodenal diverticulum.   MRI/MRCP 10/30/20: no change in cluster of small cysts in the pancreatic head the largest of which appears to communicate with the pancreatic duct. There is downstream duct dilation through the pancreatic head and mild upstread duct dilation suspicious for side branch intraductal papillary mucinous tumor. No worrisome features.   At time of last office visit, reported longstanding history of constipation treated with Metamucil and magnesium PRN. Constipation worsened after sacral colpopexy and laparoscopic bladder repair 11/2019. Recently called the office with worsening constipation despite increasing fiber and water  intake in addition to Miralax QD. But, constipation has since been replaced with loose stools. No associated abdominal  pain.  Colonoscopy 11/03/20 for altered bowel habits: biopsies show mild edema, otherwise normal. Left-sided diverticulosis. Internal hemorrhoids.   Returns in follow-up after her colonoscopy.  Uses Benefiber or Miralax when she feels constipation might be coming on. She is finding regulating her bowels very difficulty.  In the last couple of weeks her symptoms may be leveling out.    Creon before meals helps with abdominal pain and post-prandial gas pains.   Fluctating between 6-8 BM every other day Frequent bloating Frustrated that she always to know where a bathroom is Worried about what to eat Doesn't even walk her dogs any more  She has gained 2 pounds.    Recent labs: Labs 05/03/19: normal liver enzymes Labs 07/17/19 lipase 111, amylase 72 Labs 08/15/19: normal liver enzymes Labs 10/21/20: normal CMP Labs 10/23/20: pancreatic elastase 42  Abdominal imaging: - Prior CT scan in [place] 2019 reported "pancrease is unremarkable."   Past Medical History:  Diagnosis Date   Anemia    Arthritis    CAD (coronary artery disease)    High cholesterol    Hypertension    Pancreatic cyst    SCCA (squamous cell carcinoma) of skin 2019   Right Arm [facility])   Sleep apnea    Squamous cell carcinoma of skin 2019   Left Leg [facility])   Thyroid disease     Past Surgical History:  Procedure Laterality Date   ABDOMINAL HYSTERECTOMY     APPENDECTOMY     BIOPSY  09/27/2019   Procedure: BIOPSY;  Surgeon: Milus Banister, MD;  [facility];  Service: Endoscopy;;   BREAST EXCISIONAL BIOPSY Right    BUNIONECTOMY     carcinoma removed Right 09/2020   CYSTOCELE REPAIR     bladder repair   ESOPHAGOGASTRODUODENOSCOPY (EGD) WITH PROPOFOL N/A 09/27/2019   Procedure: ESOPHAGOGASTRODUODENOSCOPY (EGD) WITH PROPOFOL;  Surgeon: Milus Banister, MD;  [facility];  Service: Endoscopy;  Laterality: N/A;   EUS N/A 09/27/2019   Procedure: UPPER ENDOSCOPIC ULTRASOUND (EUS)  RADIAL;  Surgeon: Milus Banister, MD;  [facility];  Service: Endoscopy;  Laterality: N/A;   TYMPANOPLASTY Left 06/2020   VAGINAL PROLAPSE REPAIR  12/07/2019    Current Outpatient Medications  Medication Sig Dispense Refill   amLODipine-benazepril (LOTREL) 5-10 MG capsule Take 1 capsule by mouth daily. 90 capsule 3   aspirin EC 81 MG tablet Take 81 mg by mouth daily. Swallow whole.     atorvastatin (LIPITOR) 20 MG tablet Take 1 tablet (20 mg total) by mouth daily. 90 tablet 2   citalopram (CELEXA) 10 MG tablet Take 1 tablet (10 mg total) by mouth daily. 30 tablet 5   conjugated estrogens (PREMARIN) vaginal cream Place 1 Applicatorful vaginally once a week.      diazepam (VALIUM) 5 MG tablet TAKE 45 MINUTES PRIOR TO MRI 1 tablet 0   doxycycline (VIBRA-TABS) 100 MG tablet Take 1 tablet (100 mg total) by mouth 2 (two) times daily. 14 tablet 0   estrogens, conjugated, (PREMARIN) 0.3 MG tablet Take 0.3 mg by mouth every Monday, Wednesday, and Friday.      hydrochlorothiazide (HYDRODIURIL) 25 MG tablet Take 0.5 tablets (12.5 mg total) by mouth daily. 90 tablet 3   levothyroxine (SYNTHROID) 50 MCG tablet Take 1 tablet (50 mcg total) by mouth daily. 90 tablet 3   Pancrelipase, Lip-Prot-Amyl, (CREON) 28315-17616  units CPEP Take 1 capsule (24,000 Units total) by mouth 3 (three) times daily with meals. Take 1 dose by mouth  with every meal. May take 1/2 the dose by mouth with each snack. 180 capsule 2   potassium chloride SA (KLOR-CON) 20 MEQ tablet Take 1 tablet (20 mEq total) by mouth daily. (Patient taking differently: Take 20 mEq by mouth daily. Taking 1/2 tablet daily) 90 tablet 1   Propylene Glycol 0.6 % SOLN 1 drop.     rifaximin (XIFAXAN) 550 MG TABS tablet Take 1 tablet (550 mg total) by mouth 3 (three) times daily for 14 days. 42 tablet 0   Vitamin D, Cholecalciferol, 25 MCG (1000 UT) CAPS Take 1 capsule by mouth daily. 90 capsule 3   zolpidem (AMBIEN CR) 12.5 MG CR tablet TAKE 1  TABLET(12.5 MG) BY MOUTH AT BEDTIME AS NEEDED FOR SLEEP 30 tablet 5   No current facility-administered medications for this visit.    Allergies as of 11/14/2020 - Review Complete 11/14/2020  Allergen Reaction Noted   Tape Rash 09/29/2020    Family History  Problem Relation Age of Onset   Hyperlipidemia Mother    Hypertension Mother    Stroke Mother    Lung disease Father    Heart disease Brother    Colon cancer Neg Hx      Physical Exam: General:   Alert,  well-nourished, pleasant and cooperative in NAD Head:  Normocephalic and atraumatic. Eyes:  Sclera clear, no icterus.   Conjunctiva pink. Abdomen:  Soft, nontender, nondistended, normal bowel sounds Skin:  Intact without significant lesions or rashes. Psych:  Alert and cooperative. Normal mood and affect.    Iris Tatsch L. Tarri Glenn, MD, MPH 11/23/2020, 7:27 PM

## 2020-11-14 NOTE — Patient Instructions (Addendum)
I recommend that you continue to use the Creon before all meals and snacks.  I have recommend another 2 weeks of Xifaxan to try to reset the gut bacterial.  In the meantime, continue to use Metamuci or Citrucel every day.   I would like to see you back in the next month or so to be sure that you are feeling better. Please submit a stool sample one week before your next appointment (around 10/19/20).  We will plan another MRI next fall to closely follow your pancreatic cysts.

## 2020-11-17 ENCOUNTER — Telehealth: Payer: Self-pay | Admitting: Gastroenterology

## 2020-11-17 NOTE — Telephone Encounter (Signed)
Pt called stating that Xifaxan will have to be preauthorized.  She said Dr. Tarri Glenn said there was an alternative she could call in for her if her insurance did not approve the [place]. If insurance does not approve, she asked that you call in the alternative and call her to let her know one way or the other.  Thank you.

## 2020-11-17 NOTE — Telephone Encounter (Signed)
Linda Knight (Key: [id number]) Rx #: [phone] [id number] Xifaxan 550MG  tablets  PA started. Insurance requires 72 hour turn around for decision.

## 2020-11-21 NOTE — Telephone Encounter (Signed)
Patient return call. ?

## 2020-11-21 NOTE — Telephone Encounter (Signed)
Dr Tarri Glenn- Patient's Doreene Nest has been denied. Any alternative suggestions?

## 2020-11-21 NOTE — Telephone Encounter (Signed)
Linda Knight (Key: [id number]) Rx #: [phone] Xifaxan 550MG  tablets   Form [employer] Electronic PA Form [phone] NCPDP) Created 7 days ago Sent to Plan 4 days ago Plan Response 4 days ago Submit Clinical Questions 4 days ago Determination Unfavorable 1 day ago Message from Plan CaseId:[id number];Status:Denied;Review Type:;Appeal Information: Attention:ATTN: [place] [id number]. RFXJO,IT,[id number] RAXEN:[phone] SRP:[phone]; Important - Please read the below note on eAppeals: Please reference the denial letter for information on the rights for an appeal, rationale for the denial, and how to submit an appeal including if any information is needed to support the appeal. Note about urgent situations - Generally, an urgent situation is one which, in the opinion of the provider, the health of the patient may be in serious jeopardy or may experience pain that cannot be adequately controlled while waiting for a decision on the appeal.;

## 2020-11-21 NOTE — Telephone Encounter (Signed)
===  View-only below this line=== ----- Message ----- From: Thornton Park, MD Sent: 11/21/2020  10:56 AM EDT To: Larina Bras, CMA  Doxycyline 100 mg BID x 14 days.  Thank you.  KLB

## 2020-11-21 NOTE — Telephone Encounter (Signed)
I have left a message for patient to call back. 

## 2020-11-23 ENCOUNTER — Encounter: Payer: Self-pay | Admitting: Gastroenterology

## 2020-11-24 MED ORDER — DOXYCYCLINE HYCLATE 100 MG PO TABS: 100.0000 mg | ORAL_TABLET | Freq: Two times a day (BID) | ORAL | 0 refills | Status: DC

## 2020-11-24 NOTE — Telephone Encounter (Signed)
I have spoken to patient to advise that unfortunately, Xifaxan was denied by insurance. Dr Tarri Glenn indicates that she will give doxycycline in its place. Patient verbalizes understanding. Rx sent to pharmacy.

## 2020-11-24 NOTE — Addendum Note (Signed)
Addended by: Larina Bras on: 11/24/2020 08:31 AM   Modules accepted: Orders

## 2020-12-09 ENCOUNTER — Ambulatory Visit (INDEPENDENT_AMBULATORY_CARE_PROVIDER_SITE_OTHER): Payer: Medicare Other | Admitting: Family Medicine

## 2020-12-09 ENCOUNTER — Encounter: Payer: Self-pay | Admitting: Family Medicine

## 2020-12-09 ENCOUNTER — Other Ambulatory Visit: Payer: Self-pay

## 2020-12-09 VITALS — BP 146/76 | HR 67 | Temp 97.6°F | Ht 64.0 in | Wt 127.0 lb

## 2020-12-09 DIAGNOSIS — E039 Hypothyroidism, unspecified: Secondary | ICD-10-CM

## 2020-12-09 DIAGNOSIS — Z23 Encounter for immunization: Secondary | ICD-10-CM | POA: Diagnosis not present

## 2020-12-09 DIAGNOSIS — I10 Essential (primary) hypertension: Secondary | ICD-10-CM

## 2020-12-09 LAB — COMPREHENSIVE METABOLIC PANEL
ALT: 13 U/L (ref 0–35)
AST: 21 U/L (ref 0–37)
Albumin: 4.3 g/dL (ref 3.5–5.2)
Alkaline Phosphatase: 76 U/L (ref 39–117)
BUN: 24 mg/dL — ABNORMAL HIGH (ref 6–23)
CO2: 29 mEq/L (ref 19–32)
Calcium: 9.4 mg/dL (ref 8.4–10.5)
Chloride: 96 mEq/L (ref 96–112)
Creatinine, Ser: 1.1 mg/dL (ref 0.40–1.20)
GFR: 48.01 mL/min — ABNORMAL LOW (ref >60.00)
Glucose, Bld: 93 mg/dL (ref 70–99)
Potassium: 3.8 mEq/L (ref 3.5–5.1)
Sodium: 135 mEq/L (ref 135–145)
Total Bilirubin: 0.6 mg/dL (ref 0.2–1.2)
Total Protein: 7.1 g/dL (ref 6.0–8.3)

## 2020-12-09 LAB — CBC
HCT: 30.8 % — ABNORMAL LOW (ref 36.0–46.0)
Hemoglobin: 10.6 g/dL — ABNORMAL LOW (ref 12.0–15.0)
MCHC: 34.5 g/dL (ref 30.0–36.0)
MCV: 99.4 fl (ref 78.0–100.0)
Platelets: 227 10*3/uL (ref 150.0–400.0)
RBC: 3.1 Mil/uL — ABNORMAL LOW (ref 3.87–5.11)
RDW: 12.6 % (ref 11.5–15.5)
WBC: 3.8 10*3/uL — ABNORMAL LOW (ref 4.0–10.5)

## 2020-12-09 LAB — TSH: TSH: 1.44 u[IU]/mL (ref 0.35–5.50)

## 2020-12-09 LAB — T4, FREE: Free T4: 1.03 ng/dL (ref 0.60–1.60)

## 2020-12-09 LAB — T3, FREE: T3, Free: 2.7 pg/mL (ref 2.3–4.2)

## 2020-12-09 NOTE — Assessment & Plan Note (Signed)
At goal per JNC-8.  Recently found to have low potassium.  Was started on potassium supplement.  This could be a side effect of her HCTZ.  She has been off of her potassium supplement for the last few weeks.  We will recheck BMET today.  If has low potassium will likely stop HCTZ.  For now we will continue current dose amlodipine-benazepril 5-10 once daily and HCTZ 12.5mg  once daily

## 2020-12-09 NOTE — Progress Notes (Signed)
   Linda Knight is a 80 y.o. female who presents today for an office visit.  Assessment/Plan:  New/Acute Problems: Low Potassium Recheck today.  Has not been on potassium supplement for couple weeks.  Possibly side effect of HCTZ.  If it is persistently low will likely stop HCTZ as below.  Ear Pain Small amount of irritation in EAC.  We will continue with watchful waiting.  She will follow-up with ENT if continues to be an issue.  Chronic Problems Addressed Today: Hypothyroidism On synthroid 14mcg daily. Check TSH, T3, and T4.   Essential hypertension At goal per JNC-8.  Recently found to have low potassium.  Was started on potassium supplement.  This could be a side effect of her HCTZ.  She has been off of her potassium supplement for the last few weeks.  We will recheck BMET today.  If has low potassium will likely stop HCTZ.  For now we will continue current dose amlodipine-benazepril 5-10 once daily and HCTZ 12.5mg  once daily     Subjective:  HPI:  She complain of sharp pain in the left ear. This started yesterday morning. She underwent tympanoplasty. She noted her eardrum is not completely close and is concerned about it. She wants to ensure she does not have an infection  Recently diagnosed for potassium.  Her nephrologist told her that she should take potassium supplement.  She has been off this for a couple weeks.  She is having significant side effects with potassium supplementation and would like to stop if possible.   She is currently on Synthroid 50 mg daily for hypothyroidism. She would like to check TSH today at the office. She has had more brittle hair and is concerned about her levels being off.        Objective:  Physical Exam: BP (!) 146/76   Pulse 67   Temp 97.6 F (36.4 C) (Temporal)   Ht 5\' 4"  (1.626 m)   Wt 127 lb (57.6 kg)   SpO2 100%   BMI 21.80 kg/m   Gen: No acute distress, resting comfortably CV: Regular rate and rhythm with no murmurs  appreciated Pulm: Normal work of breathing, clear to auscultation bilaterally with no crackles, wheezes, or rhonchi Neuro: Grossly normal, moves all extremities Psych: Normal affect and thought content       I,Savera Zaman,acting as a scribe for Dimas Chyle, MD.,have documented all relevant documentation on the behalf of Dimas Chyle, MD,as directed by  Dimas Chyle, MD while in the presence of Dimas Chyle, MD.   I, Dimas Chyle, MD, have reviewed all documentation for this visit. The documentation on 12/09/20 for the exam, diagnosis, procedures, and orders are all accurate and complete.  Algis Greenhouse. Jerline Pain, MD 12/09/2020 11:18 AM

## 2020-12-09 NOTE — Assessment & Plan Note (Signed)
On synthroid 82mcg daily. Check TSH, T3, and T4.

## 2020-12-09 NOTE — Patient Instructions (Signed)
It was very nice to see you today!  We will give you your flu shot and check blood work.  We may need to stop your HCTZ depending on the results of your labs.  Take care, Dr Jerline Pain  PLEASE NOTE:  If you had any lab tests please let us know if you have not heard back within a few days. You may see your results on mychart before we have a chance to review them but we will give you a call once they are reviewed by [place]. If we ordered any referrals today, please let us know if you have not heard from their office within the next week.   Please try these tips to maintain a healthy lifestyle:  Eat at least 3 REAL meals and 1-2 snacks per day.  Aim for no more than 5 hours between eating.  If you eat breakfast, please do so within one hour of getting up.   Each meal should contain half fruits/vegetables, one quarter protein, and one quarter carbs (no bigger than a computer mouse)  Cut down on sweet beverages. This includes juice, soda, and sweet tea.   Drink at least 1 glass of water with each meal and aim for at least 8 glasses per day  Exercise at least 150 minutes every week.

## 2020-12-10 NOTE — Progress Notes (Signed)
Please inform patient of the following:  Her labs are all stable. It is ok for her to stay off her potassium.  We should continue her current dose of thyroid medications.  We can recheck in 6-12 months.

## 2020-12-12 ENCOUNTER — Telehealth: Payer: Self-pay | Admitting: Family Medicine

## 2020-12-12 NOTE — Telephone Encounter (Signed)
Copied from [place] [phone]. Topic: Medicare AWV >> Dec 12, 2020 10:09 AM Harris-Coley, Hannah Beat wrote: Reason for CRM: Left message for patient to schedule Annual Wellness Visit.  Please schedule with Nurse Health Advisor Charlott Rakes, RN at [facility].  Please call [phone] ask for [facility]

## 2020-12-15 ENCOUNTER — Other Ambulatory Visit: Payer: Self-pay | Admitting: *Deleted

## 2020-12-15 ENCOUNTER — Encounter: Payer: Self-pay | Admitting: Family Medicine

## 2020-12-15 MED ORDER — AMLODIPINE BESY-BENAZEPRIL HCL 5-10 MG PO CAPS: 1.0000 | ORAL_CAPSULE | Freq: Every day | ORAL | 3 refills | Status: DC

## 2020-12-16 ENCOUNTER — Ambulatory Visit: Payer: Medicare Other | Admitting: Gastroenterology

## 2020-12-17 ENCOUNTER — Telehealth: Payer: Self-pay | Admitting: Family Medicine

## 2020-12-17 NOTE — Telephone Encounter (Signed)
error 

## 2020-12-17 NOTE — Telephone Encounter (Signed)
Scheduled patient's AWV she is requesting for Dr Jerline Pain for labs for her cholesterol levels.  Please call. khc

## 2020-12-17 NOTE — Telephone Encounter (Signed)
Error

## 2020-12-19 ENCOUNTER — Other Ambulatory Visit: Payer: Medicare Other

## 2020-12-19 DIAGNOSIS — K862 Cyst of pancreas: Secondary | ICD-10-CM

## 2020-12-19 DIAGNOSIS — K8689 Other specified diseases of pancreas: Secondary | ICD-10-CM

## 2020-12-19 DIAGNOSIS — R109 Unspecified abdominal pain: Secondary | ICD-10-CM

## 2020-12-19 DIAGNOSIS — R194 Change in bowel habit: Secondary | ICD-10-CM

## 2020-12-23 ENCOUNTER — Ambulatory Visit (INDEPENDENT_AMBULATORY_CARE_PROVIDER_SITE_OTHER): Payer: Medicare Other

## 2020-12-23 DIAGNOSIS — Z0000 Encounter for general adult medical examination without abnormal findings: Secondary | ICD-10-CM | POA: Diagnosis not present

## 2020-12-23 NOTE — Patient Instructions (Signed)
[name] , Thank you for taking time to come for your Medicare Wellness Visit. I appreciate your ongoing commitment to your health goals. Please review the following plan we discussed and let me know if I can assist you in the future.   Screening recommendations/referrals: Colonoscopy: Done 11/03/20 repeat yearly per pt Mammogram: Done 02/22/20 repeat every year Bone Density: Done 10/14/17 repeat every 2 years  Recommended yearly ophthalmology/optometry visit for glaucoma screening and checkup Recommended yearly dental visit for hygiene and checkup  Vaccinations: Influenza vaccine: Done 12/09/20 Pneumococcal vaccine: Due and discussed Tdap vaccine: Due Shingles vaccine: discussed   Covid-19:Completed 2/14, 04/17/19 & 12/22/20  Advanced directives: Please bring a copy of your health care power of attorney and living will to the office at your convenience.  Conditions/risks identified: None at this time  Next appointment: Follow up in one year for your annual wellness visit    Preventive Care 65 Years and Older, Female Preventive care refers to lifestyle choices and visits with your health care provider that can promote health and wellness. What does preventive care include? A yearly physical exam. This is also called an annual well check. Dental exams once or twice a year. Routine eye exams. Ask your health care provider how often you should have your eyes checked. Personal lifestyle choices, including: Daily care of your teeth and gums. Regular physical activity. Eating a healthy diet. Avoiding tobacco and drug use. Limiting alcohol use. Practicing safe sex. Taking low-dose aspirin every day. Taking vitamin and mineral supplements as recommended by your health care provider. What happens during an annual well check? The services and screenings done by your health care provider during your annual well check will depend on your age, overall health, lifestyle risk factors, and  family history of disease. Counseling  Your health care provider may ask you questions about your: Alcohol use. Tobacco use. Drug use. Emotional well-being. Home and relationship well-being. Sexual activity. Eating habits. History of falls. Memory and ability to understand (cognition). Work and work Statistician. Reproductive health. Screening  You may have the following tests or measurements: Height, weight, and BMI. Blood pressure. Lipid and cholesterol levels. These may be checked every 5 years, or more frequently if you are over 33 years old. Skin check. Lung cancer screening. You may have this screening every year starting at age 26 if you have a 30-pack-year history of smoking and currently smoke or have quit within the past 15 years. Fecal occult blood test (FOBT) of the stool. You may have this test every year starting at age 11. Flexible sigmoidoscopy or colonoscopy. You may have a sigmoidoscopy every 5 years or a colonoscopy every 10 years starting at age 75. Hepatitis C blood test. Hepatitis B blood test. Sexually transmitted disease (STD) testing. Diabetes screening. This is done by checking your blood sugar (glucose) after you have not eaten for a while (fasting). You may have this done every 1-3 years. Bone density scan. This is done to screen for osteoporosis. You may have this done starting at age 52. Mammogram. This may be done every 1-2 years. Talk to your health care provider about how often you should have regular mammograms. Talk with your health care provider about your test results, treatment options, and if necessary, the need for more tests. Vaccines  Your health care provider may recommend certain vaccines, such as: Influenza vaccine. This is recommended every year. Tetanus, diphtheria, and acellular pertussis (Tdap, Td) vaccine. You may need a Td booster every 10 years.  Zoster vaccine. You may need this after age 19. Pneumococcal 13-valent conjugate  (PCV13) vaccine. One dose is recommended after age 64. Pneumococcal polysaccharide (PPSV23) vaccine. One dose is recommended after age 78. Talk to your health care provider about which screenings and vaccines you need and how often you need them. This information is not intended to replace advice given to you by your health care provider. Make sure you discuss any questions you have with your health care provider. Document Released: 02/21/2015 Document Revised: 10/15/2015 Document Reviewed: 11/26/2014 Elsevier Interactive Patient Education  2017 [place] Prevention in the Home Falls can cause injuries. They can happen to people of all ages. There are many things you can do to make your home safe and to help prevent falls. What can I do on the outside of my home? Regularly fix the edges of walkways and driveways and fix any cracks. Remove anything that might make you trip as you walk through a door, such as a raised step or threshold. Trim any bushes or trees on the path to your home. Use bright outdoor lighting. Clear any walking paths of anything that might make someone trip, such as rocks or tools. Regularly check to see if handrails are loose or broken. Make sure that both sides of any steps have handrails. Any raised decks and porches should have guardrails on the edges. Have any leaves, snow, or ice cleared regularly. Use sand or salt on walking paths during winter. Clean up any spills in your garage right away. This includes oil or grease spills. What can I do in the bathroom? Use night lights. Install grab bars by the toilet and in the tub and shower. Do not use towel bars as grab bars. Use non-skid mats or decals in the tub or shower. If you need to sit down in the shower, use a plastic, non-slip stool. Keep the floor dry. Clean up any water that spills on the floor as soon as it happens. Remove soap buildup in the tub or shower regularly. Attach bath mats securely with  double-sided non-slip rug tape. Do not have throw rugs and other things on the floor that can make you trip. What can I do in the bedroom? Use night lights. Make sure that you have a light by your bed that is easy to reach. Do not use any sheets or blankets that are too big for your bed. They should not hang down onto the floor. Have a firm chair that has side arms. You can use this for support while you get dressed. Do not have throw rugs and other things on the floor that can make you trip. What can I do in the kitchen? Clean up any spills right away. Avoid walking on wet floors. Keep items that you use a lot in easy-to-reach places. If you need to reach something above you, use a strong step stool that has a grab bar. Keep electrical cords out of the way. Do not use floor polish or wax that makes floors slippery. If you must use wax, use non-skid floor wax. Do not have throw rugs and other things on the floor that can make you trip. What can I do with my stairs? Do not leave any items on the stairs. Make sure that there are handrails on both sides of the stairs and use them. Fix handrails that are broken or loose. Make sure that handrails are as long as the stairways. Check any carpeting to make sure that  it is firmly attached to the stairs. Fix any carpet that is loose or worn. Avoid having throw rugs at the top or bottom of the stairs. If you do have throw rugs, attach them to the floor with carpet tape. Make sure that you have a light switch at the top of the stairs and the bottom of the stairs. If you do not have them, ask someone to add them for you. What else can I do to help prevent falls? Wear shoes that: Do not have high heels. Have rubber bottoms. Are comfortable and fit you well. Are closed at the toe. Do not wear sandals. If you use a stepladder: Make sure that it is fully opened. Do not climb a closed stepladder. Make sure that both sides of the stepladder are locked  into place. Ask someone to hold it for you, if possible. Clearly mark and make sure that you can see: Any grab bars or handrails. First and last steps. Where the edge of each step is. Use tools that help you move around (mobility aids) if they are needed. These include: Canes. Walkers. Scooters. Crutches. Turn on the lights when you go into a dark area. Replace any light bulbs as soon as they burn out. Set up your furniture so you have a clear path. Avoid moving your furniture around. If any of your floors are uneven, fix them. If there are any pets around you, be aware of where they are. Review your medicines with your doctor. Some medicines can make you feel dizzy. This can increase your chance of falling. Ask your doctor what other things that you can do to help prevent falls. This information is not intended to replace advice given to you by your health care provider. Make sure you discuss any questions you have with your health care provider. Document Released: 11/21/2008 Document Revised: 07/03/2015 Document Reviewed: 03/01/2014 Elsevier Interactive Patient Education  2017 [employer].

## 2020-12-23 NOTE — Progress Notes (Signed)
Virtual Visit via Telephone Note  I connected with  Linda Knight on 12/23/20 at 10:15 AM EST by telephone and verified that I am speaking with the correct person using two identifiers.  Medicare Annual Wellness visit completed telephonically due to Covid-19 pandemic.   Persons participating in this call: This Health Coach and this patient.   Location: Patient: home Provider: office   I discussed the limitations, risks, security and privacy concerns of performing an evaluation and management service by telephone and the availability of in person appointments. The patient expressed understanding and agreed to proceed.  Unable to perform video visit due to video visit attempted and failed and/or patient does not have video capability.   Some vital signs may be absent or patient reported.   Willette Brace, LPN   Subjective:   Linda Knight is a 80 y.o. female who presents for Medicare Annual (Subsequent) preventive examination.  Review of Systems     Cardiac Risk Factors include: advanced age (>30men, >62 women);hypertension     Objective:    Today's Vitals   12/23/20 1021  PainSc: 8    There is no height or weight on file to calculate BMI.  Advanced Directives 12/23/2020 09/27/2019 05/22/2019 09/03/2018  Does Patient Have a Medical Advance Directive? Yes Yes Yes No  Type of Paramedic of [place];Living will [place];Living will Living will;Healthcare Power of Attorney -  Does patient want to make changes to medical advance directive? - - No - Patient declined -  Copy of [place] in Chart? No - copy requested No - copy requested No - copy requested -  Would patient like information on creating a medical advance directive? - - - No - Patient declined    Current Medications (verified) Outpatient Encounter Medications as of 12/23/2020  Medication Sig   amLODipine-benazepril (LOTREL) 5-10 MG capsule  Take 1 capsule by mouth daily.   aspirin EC 81 MG tablet Take 81 mg by mouth daily. Swallow whole.   atorvastatin (LIPITOR) 20 MG tablet Take 1 tablet (20 mg total) by mouth daily.   citalopram (CELEXA) 10 MG tablet Take 1 tablet (10 mg total) by mouth daily.   conjugated estrogens (PREMARIN) vaginal cream Place 1 Applicatorful vaginally once a week.    estrogens, conjugated, (PREMARIN) 0.3 MG tablet Take 0.3 mg by mouth every Monday, Wednesday, and Friday.    hydrochlorothiazide (HYDRODIURIL) 25 MG tablet Take 0.5 tablets (12.5 mg total) by mouth daily.   levothyroxine (SYNTHROID) 50 MCG tablet Take 1 tablet (50 mcg total) by mouth daily.   Propylene Glycol 0.6 % SOLN 1 drop.   Vitamin D, Cholecalciferol, 25 MCG (1000 UT) CAPS Take 1 capsule by mouth daily.   zolpidem (AMBIEN CR) 12.5 MG CR tablet TAKE 1 TABLET(12.5 MG) BY MOUTH AT BEDTIME AS NEEDED FOR SLEEP   doxycycline (VIBRA-TABS) 100 MG tablet Take 1 tablet (100 mg total) by mouth 2 (two) times daily. (Patient not taking: Reported on 12/23/2020)   [DISCONTINUED] Pancrelipase, Lip-Prot-Amyl, (CREON) 24000-76000 units CPEP Take 1 capsule (24,000 Units total) by mouth 3 (three) times daily with meals. Take 1 dose by mouth  with every meal. May take 1/2 the dose by mouth with each snack. (Patient not taking: Reported on 12/23/2020)   No facility-administered encounter medications on file as of 12/23/2020.    Allergies (verified) Tape   History: Past Medical History:  Diagnosis Date   Anemia    Arthritis  CAD (coronary artery disease)    High cholesterol    Hypertension    Pancreatic cyst    SCCA (squamous cell carcinoma) of skin 2019   Right Arm [facility])   Sleep apnea    Squamous cell carcinoma of skin 2019   Left Leg [facility])   Thyroid disease    Past Surgical History:  Procedure Laterality Date   ABDOMINAL HYSTERECTOMY     APPENDECTOMY     BIOPSY  09/27/2019   Procedure: BIOPSY;  Surgeon: Milus Banister, MD;   Location: [facility];  Service: Endoscopy;;   BREAST EXCISIONAL BIOPSY Right    BUNIONECTOMY     carcinoma removed Right 09/2020   CYSTOCELE REPAIR     bladder repair   ESOPHAGOGASTRODUODENOSCOPY (EGD) WITH PROPOFOL N/A 09/27/2019   Procedure: ESOPHAGOGASTRODUODENOSCOPY (EGD) WITH PROPOFOL;  Surgeon: Milus Banister, MD;  Location: [facility];  Service: Endoscopy;  Laterality: N/A;   EUS N/A 09/27/2019   Procedure: UPPER ENDOSCOPIC ULTRASOUND (EUS) RADIAL;  Surgeon: Milus Banister, MD;  Location: [facility];  Service: Endoscopy;  Laterality: N/A;   TYMPANOPLASTY Left 06/2020   VAGINAL PROLAPSE REPAIR  12/07/2019   Family History  Problem Relation Age of Onset   Hyperlipidemia Mother    Hypertension Mother    Stroke Mother    Lung disease Father    Heart disease Brother    Colon cancer Neg Hx    Social History   Socioeconomic History   Marital status: Married    Spouse name: Not on file   Number of children: 3   Years of education: Not on file   Highest education level: Not on file  Occupational History   Occupation: Retired   Tobacco Use   Smoking status: Former    Types: Cigarettes    Quit date: 09/18/1980    Years since quitting: 40.2   Smokeless tobacco: Never  Vaping Use   Vaping Use: Never used  Substance and Sexual Activity   Alcohol use: Not Currently   Drug use: Never   Sexual activity: Not Currently    Comment: 1st intercourse 80 yo-Fewer than 5 partners  Other Topics Concern   Not on file  Social History Narrative   Moved from [place] July 2020   Social Determinants of Health   Financial Resource Strain: Low Risk    Difficulty of Paying Living Expenses: Not hard at all  Food Insecurity: No Food Insecurity   Worried About Charity fundraiser in the Last Year: Never true   Arboriculturist in the Last Year: Never true  Transportation Needs: No Transportation Needs   Lack of Transportation (Medical): No   Lack of Transportation (Non-Medical):  No  Physical Activity: Insufficiently Active   Days of Exercise per Week: 3 days   Minutes of Exercise per Session: 40 min  Stress: No Stress Concern Present   Feeling of Stress : Not at all  Social Connections: Moderately Integrated   Frequency of Communication with Friends and Family: Twice a week   Frequency of Social Gatherings with Friends and Family: Twice a week   Attends Religious Services: More than 4 times per year   Active Member of [employer] or Organizations: No   Attends Archivist Meetings: Never   Marital Status: Married    Tobacco Counseling Counseling given: Not Answered   Clinical Intake:  Pre-visit preparation completed: Yes  Pain : 0-10 (arthritis) Pain Score: 8  Pain Type: Chronic pain  Pain Location: Hip Pain Orientation: Left, Right Pain Descriptors / Indicators: Aching Pain Onset: More than a month ago Pain Frequency: Intermittent     BMI - recorded: 21.8 Nutritional Status: BMI of 19-24  Normal Nutritional Risks: None Diabetes: No  How often do you need to have someone help you when you read instructions, pamphlets, or other written materials from your doctor or pharmacy?: 1 - Never  Diabetic?no  Interpreter Needed?: No  Information entered by :: Charlott Rakes, LPN   Activities of Daily Living In your present state of health, do you have any difficulty performing the following activities: 12/23/2020  Hearing? N  Vision? N  Difficulty concentrating or making decisions? N  Walking or climbing stairs? N  Dressing or bathing? N  Doing errands, shopping? N  Preparing Food and eating ? N  Using the Toilet? N  In the past six months, have you accidently leaked urine? N  Do you have problems with loss of bowel control? N  Managing your Medications? N  Managing your Finances? N  Housekeeping or managing your Housekeeping? N  Some recent data might be hidden    Patient Care Team: Vivi Barrack, MD as PCP - General (Family  Medicine) Dian Situ, MD as Consulting Physician (Pain Medicine) Lavonna Monarch, MD as Consulting Physician (Dermatology)  Indicate any recent Medical Services you may have received from other than [facility] providers in the past year (date may be approximate).     Assessment:   This is a routine wellness examination for Linda Knight.  Hearing/Vision screen Hearing Screening - Comments:: Pt stated whole in left ear drum Vision Screening - Comments:: Pt follows [facility] with [facility] for annual eye exams   Dietary issues and exercise activities discussed: Current Exercise Habits: Home exercise routine, Type of exercise: Other - see comments (gazal machine), Time (Minutes): 40, Frequency (Times/Week): 3, Weekly Exercise (Minutes/Week): 120   Goals Addressed             This Visit's Progress    Patient Stated       None at this time       Depression Screen PHQ 2/9 Scores 12/23/2020 12/09/2020 09/15/2020 05/22/2019 11/27/2018 09/19/2018  PHQ - 2 Score 0 0 0 0 0 0  PHQ- 9 Score - - - - 2 -    Fall Risk Fall Risk  12/23/2020 12/09/2020 09/15/2020 05/22/2019 11/27/2018  Falls in the past year? 0 0 0 0 0  Number falls in past yr: 0 0 0 0 -  Injury with Fall? 0 0 0 0 -  Risk for fall due to : Impaired vision No Fall Risks No Fall Risks - -  Follow up Falls prevention discussed - - Falls evaluation completed;Education provided;Falls prevention discussed -    FALL RISK PREVENTION PERTAINING TO THE HOME:  Any stairs in or around the home? Yes  If so, are there any without handrails? No  Home free of loose throw rugs in walkways, pet beds, electrical cords, etc? Yes  Adequate lighting in your home to reduce risk of falls? Yes   ASSISTIVE DEVICES UTILIZED TO PREVENT FALLS:  Life alert? No  Use of a cane, walker or w/c? No  Grab bars in the bathroom? No  Shower chair or bench in shower? Yes  Elevated toilet seat or a handicapped toilet? No   TIMED UP AND GO:  Was the test performed? No .   Cognitive Function:     6CIT Screen 12/23/2020 05/22/2019  What Year?  0 points 0 points  What month? 0 points 0 points  What time? 0 points 0 points  Count back from 20 0 points 0 points  Months in reverse 0 points 0 points  Repeat phrase 0 points 0 points  Total Score 0 0    Immunizations Immunization History  Administered Date(s) Administered   Fluad Quad(high Dose 65+) 11/14/2019, 12/09/2020   Influenza, High Dose Seasonal PF 11/11/2018, 11/14/2019   PFIZER Comirnaty(Gray Top)Covid-19 Tri-Sucrose Vaccine 03/25/2019, 04/17/2019   PFIZER(Purple Top)SARS-COV-2 Vaccination 03/25/2019, 04/17/2019, 12/22/2020    TDAP status: Due, Education has been provided regarding the importance of this vaccine. Advised may receive this vaccine at local pharmacy or Health Dept. Aware to provide a copy of the vaccination record if obtained from local pharmacy or Health Dept. Verbalized acceptance and understanding.  Flu Vaccine status: Up to date  Pneumococcal vaccine status: Due, Education has been provided regarding the importance of this vaccine. Advised may receive this vaccine at local pharmacy or Health Dept. Aware to provide a copy of the vaccination record if obtained from local pharmacy or Health Dept. Verbalized acceptance and understanding.  Covid-19 vaccine status: Completed vaccines  Qualifies for Shingles Vaccine? Yes   Zostavax completed No   Shingrix Completed?: No.    Education has been provided regarding the importance of this vaccine. Patient has been advised to call insurance company to determine out of pocket expense if they have not yet received this vaccine. Advised may also receive vaccine at local pharmacy or Health Dept. Verbalized acceptance and understanding.  Screening Tests Health Maintenance  Topic Date Due   Pneumonia Vaccine 32+ Years old (1 - PCV) Never done   Hepatitis C Screening  Never done   TETANUS/TDAP  Never done   Zoster Vaccines- Shingrix (1 of 2)  Never done   COVID-19 Vaccine (4 - Booster for Pfizer series) 02/16/2021   INFLUENZA VACCINE  Completed   DEXA SCAN  Completed   HPV VACCINES  Aged Out    Health Maintenance  Health Maintenance Due  Topic Date Due   Pneumonia Vaccine 29+ Years old (1 - PCV) Never done   Hepatitis C Screening  Never done   TETANUS/TDAP  Never done   Zoster Vaccines- Shingrix (1 of 2) Never done    Colonoscopy screening every year per pt last 11/03/20  Mammogram status: Completed 02/22/20. Repeat every year  Bone Density status: Completed 10/24/17. Results reflect: Bone density results: OSTEOPENIA. Repeat every 2 years.  Additional Screening:  Hepatitis C Screening: does qualify;  Vision Screening: Recommended annual ophthalmology exams for early detection of glaucoma and other disorders of the eye. Is the patient up to date with their annual eye exam?  Yes  Who is the provider or what is the name of the office in which the patient attends annual eye exams? [facility] If pt is not established with a provider, would they like to be referred to a provider to establish care? No .   Dental Screening: Recommended annual dental exams for proper oral hygiene  Community Resource Referral / Chronic Care Management: CRR required this visit?  No   CCM required this visit?  No      Plan:     I have personally reviewed and noted the following in the patient's chart:   Medical and social history Use of alcohol, tobacco or illicit drugs  Current medications and supplements including opioid prescriptions.  Functional ability and status Nutritional status Physical activity Advanced directives List of other  physicians Hospitalizations, surgeries, and ER visits in previous 12 months Vitals Screenings to include cognitive, depression, and falls Referrals and appointments  In addition, I have reviewed and discussed with patient certain preventive protocols, quality metrics, and best practice  recommendations. A written personalized care plan for preventive services as well as general preventive health recommendations were provided to patient.     Willette Brace, LPN   [id number]   Nurse Notes: None

## 2020-12-26 ENCOUNTER — Ambulatory Visit: Payer: Medicare Other | Admitting: Gastroenterology

## 2020-12-28 LAB — PANCREATIC ELASTASE, FECAL: Pancreatic Elastase-1, Stool: >500 mcg/g

## 2021-01-21 ENCOUNTER — Ambulatory Visit: Payer: Medicare Other | Admitting: Gastroenterology

## 2021-01-21 NOTE — Addendum Note (Signed)
Addended by: Hardie Pulley, Setareh Rom J on: 01/21/2021 10:36 AM   Modules accepted: Orders

## 2021-02-03 ENCOUNTER — Other Ambulatory Visit: Payer: Self-pay | Admitting: *Deleted

## 2021-02-03 MED ORDER — CITALOPRAM HYDROBROMIDE 10 MG PO TABS: 10.0000 mg | ORAL_TABLET | Freq: Every day | ORAL | 1 refills | Status: DC

## 2021-02-10 ENCOUNTER — Other Ambulatory Visit: Payer: Self-pay | Admitting: Family Medicine

## 2021-02-10 DIAGNOSIS — Z1231 Encounter for screening mammogram for malignant neoplasm of breast: Secondary | ICD-10-CM

## 2021-02-18 ENCOUNTER — Telehealth: Payer: Self-pay | Admitting: Family Medicine

## 2021-02-18 ENCOUNTER — Other Ambulatory Visit: Payer: Self-pay | Admitting: *Deleted

## 2021-02-18 MED ORDER — HYDROCHLOROTHIAZIDE 25 MG PO TABS: 12.5000 mg | ORAL_TABLET | Freq: Every day | ORAL | 0 refills | Status: DC

## 2021-02-18 NOTE — Telephone Encounter (Signed)
Rx send to [facility]

## 2021-02-18 NOTE — Telephone Encounter (Signed)
Pt called and said she didn't realise you she was so low on hydrochlorothiazide (HYDRODIURIL) 25 MG tablet, [facility] would [facility] refill sent in there but she also asked if a small supply could be sent in to hold her over until she gets the other order, called into: [facility] Address: [address] [place], [place] [address] Phone: [phone]  Please advise. Call back if needed [phone]

## 2021-03-03 ENCOUNTER — Ambulatory Visit
Admission: RE | Admit: 2021-03-03 | Discharge: 2021-03-03 | Disposition: A | Discharge status: Home / Self Care | Payer: Medicare Other | Source: Ambulatory Visit | Attending: Family Medicine | Admitting: Family Medicine

## 2021-03-03 ENCOUNTER — Other Ambulatory Visit: Payer: Self-pay

## 2021-03-03 DIAGNOSIS — Z1231 Encounter for screening mammogram for malignant neoplasm of breast: Secondary | ICD-10-CM

## 2021-03-11 ENCOUNTER — Encounter: Payer: Self-pay | Admitting: Family Medicine

## 2021-03-11 ENCOUNTER — Ambulatory Visit (INDEPENDENT_AMBULATORY_CARE_PROVIDER_SITE_OTHER): Payer: Medicare Other | Admitting: Family Medicine

## 2021-03-11 ENCOUNTER — Other Ambulatory Visit: Payer: Self-pay

## 2021-03-11 VITALS — BP 122/77 | HR 67 | Temp 97.8°F | Ht 64.17 in | Wt 133.8 lb

## 2021-03-11 DIAGNOSIS — E785 Hyperlipidemia, unspecified: Secondary | ICD-10-CM | POA: Diagnosis not present

## 2021-03-11 DIAGNOSIS — R739 Hyperglycemia, unspecified: Secondary | ICD-10-CM

## 2021-03-11 DIAGNOSIS — Z1159 Encounter for screening for other viral diseases: Secondary | ICD-10-CM

## 2021-03-11 DIAGNOSIS — N951 Menopausal and female climacteric states: Secondary | ICD-10-CM | POA: Diagnosis not present

## 2021-03-11 DIAGNOSIS — E538 Deficiency of other specified B group vitamins: Secondary | ICD-10-CM

## 2021-03-11 DIAGNOSIS — M6289 Other specified disorders of muscle: Secondary | ICD-10-CM

## 2021-03-11 DIAGNOSIS — E039 Hypothyroidism, unspecified: Secondary | ICD-10-CM

## 2021-03-11 DIAGNOSIS — Z23 Encounter for immunization: Secondary | ICD-10-CM | POA: Diagnosis not present

## 2021-03-11 DIAGNOSIS — I10 Essential (primary) hypertension: Secondary | ICD-10-CM | POA: Diagnosis not present

## 2021-03-11 DIAGNOSIS — G4700 Insomnia, unspecified: Secondary | ICD-10-CM

## 2021-03-11 LAB — LIPID PANEL
Cholesterol: 193 mg/dL (ref 0–200)
HDL: 79.3 mg/dL (ref >39.00)
LDL Cholesterol: 95 mg/dL (ref 0–99)
NonHDL: 113.42
Total CHOL/HDL Ratio: 2
Triglycerides: 94 mg/dL (ref 0.0–149.0)
VLDL: 18.8 mg/dL (ref 0.0–40.0)

## 2021-03-11 LAB — HEPATITIS C ANTIBODY
Hepatitis C Ab: NONREACTIVE
SIGNAL TO CUT-OFF: 0.08 (ref <1.00)

## 2021-03-11 LAB — COMPREHENSIVE METABOLIC PANEL
ALT: 16 U/L (ref 0–35)
AST: 22 U/L (ref 0–37)
Albumin: 4.5 g/dL (ref 3.5–5.2)
Alkaline Phosphatase: 75 U/L (ref 39–117)
BUN: 16 mg/dL (ref 6–23)
CO2: 33 mEq/L — ABNORMAL HIGH (ref 19–32)
Calcium: 10.1 mg/dL (ref 8.4–10.5)
Chloride: 95 mEq/L — ABNORMAL LOW (ref 96–112)
Creatinine, Ser: 1.09 mg/dL (ref 0.40–1.20)
GFR: 48.45 mL/min — ABNORMAL LOW (ref >60.00)
Glucose, Bld: 98 mg/dL (ref 70–99)
Potassium: 3.9 mEq/L (ref 3.5–5.1)
Sodium: 135 mEq/L (ref 135–145)
Total Bilirubin: 0.6 mg/dL (ref 0.2–1.2)
Total Protein: 7.6 g/dL (ref 6.0–8.3)

## 2021-03-11 LAB — CBC
HCT: 33.5 % — ABNORMAL LOW (ref 36.0–46.0)
Hemoglobin: 11.6 g/dL — ABNORMAL LOW (ref 12.0–15.0)
MCHC: 34.5 g/dL (ref 30.0–36.0)
MCV: 97.9 fl (ref 78.0–100.0)
Platelets: 265 10*3/uL (ref 150.0–400.0)
RBC: 3.42 Mil/uL — ABNORMAL LOW (ref 3.87–5.11)
RDW: 12.6 % (ref 11.5–15.5)
WBC: 4.7 10*3/uL (ref 4.0–10.5)

## 2021-03-11 LAB — HEMOGLOBIN A1C: Hgb A1c MFr Bld: 5.4 % (ref 4.6–6.5)

## 2021-03-11 LAB — TSH: TSH: 2.11 u[IU]/mL (ref 0.35–5.50)

## 2021-03-11 LAB — VITAMIN B12: Vitamin B-12: 485 pg/mL (ref 211–911)

## 2021-03-11 NOTE — Addendum Note (Signed)
Addended by: Verlon Setting on: 03/11/2021 10:26 AM   Modules accepted: Orders

## 2021-03-11 NOTE — Assessment & Plan Note (Signed)
Check B12 

## 2021-03-11 NOTE — Patient Instructions (Signed)
It was very nice to see you today!  We will check blood work..  We will give your pneumonia vaccine today.  Please continue the good work with your diet and exercise.  I will see you back in a year.  Come back to see [place] sooner if needed.  Take care, Dr Jerline Pain  PLEASE NOTE:  If you had any lab tests please let us know if you have not heard back within a few days. You may see your results on mychart before we have a chance to review them but we will give you a call once they are reviewed by [place]. If we ordered any referrals today, please let us know if you have not heard from their office within the next week.   Please try these tips to maintain a healthy lifestyle:  Eat at least 3 REAL meals and 1-2 snacks per day.  Aim for no more than 5 hours between eating.  If you eat breakfast, please do so within one hour of getting up.   Each meal should contain half fruits/vegetables, one quarter protein, and one quarter carbs (no bigger than a computer mouse)  Cut down on sweet beverages. This includes juice, soda, and sweet tea.   Drink at least 1 glass of water with each meal and aim for at least 8 glasses per day  Exercise at least 150 minutes every week.    Preventive Care 91 Years and Older, Female Preventive care refers to lifestyle choices and visits with your health care provider that can promote health and wellness. Preventive care visits are also called wellness exams. What can I expect for my preventive care visit? Counseling Your health care provider may ask you questions about your: Medical history, including: Past medical problems. Family medical history. Pregnancy and menstrual history. History of falls. Current health, including: Memory and ability to understand (cognition). Emotional well-being. Home life and relationship well-being. Sexual activity and sexual health. Lifestyle, including: Alcohol, nicotine or tobacco, and drug use. Access to firearms. Diet,  exercise, and sleep habits. Work and work Statistician. Sunscreen use. Safety issues such as seatbelt and bike helmet use. Physical exam Your health care provider will check your: Height and weight. These may be used to calculate your BMI (body mass index). BMI is a measurement that tells if you are at a healthy weight. Waist circumference. This measures the distance around your waistline. This measurement also tells if you are at a healthy weight and may help predict your risk of certain diseases, such as type 2 diabetes and high blood pressure. Heart rate and blood pressure. Body temperature. Skin for abnormal spots. What immunizations do I need? Vaccines are usually given at various ages, according to a schedule. Your health care provider will recommend vaccines for you based on your age, medical history, and lifestyle or other factors, such as travel or where you work. What tests do I need? Screening Your health care provider may recommend screening tests for certain conditions. This may include: Lipid and cholesterol levels. Hepatitis C test. Hepatitis B test. HIV (human immunodeficiency virus) test. STI (sexually transmitted infection) testing, if you are at risk. Lung cancer screening. Colorectal cancer screening. Diabetes screening. This is done by checking your blood sugar (glucose) after you have not eaten for a while (fasting). Mammogram. Talk with your health care provider about how often you should have regular mammograms. BRCA-related cancer screening. This may be done if you have a family history of breast, ovarian, tubal, or peritoneal  cancers. Bone density scan. This is done to screen for osteoporosis. Talk with your health care provider about your test results, treatment options, and if necessary, the need for more tests. Follow these instructions at home: Eating and drinking  Eat a diet that includes fresh fruits and vegetables, whole grains, lean protein, and low-fat  dairy products. Limit your intake of foods with high amounts of sugar, saturated fats, and salt. Take vitamin and mineral supplements as recommended by your health care provider. Do not drink alcohol if your health care provider tells you not to drink. If you drink alcohol: Limit how much you have to 0-1 drink a day. Know how much alcohol is in your drink. In the [place] one drink equals one 12 oz bottle of beer (355 mL), one 5 oz glass of wine (148 mL), or one 1 oz glass of hard liquor (44 mL). Lifestyle Brush your teeth every morning and night with fluoride toothpaste. Floss one time each day. Exercise for at least 30 minutes 5 or more days each week. Do not use any products that contain nicotine or tobacco. These products include cigarettes, chewing tobacco, and vaping devices, such as e-cigarettes. If you need help quitting, ask your health care provider. Do not use drugs. If you are sexually active, practice safe sex. Use a condom or other form of protection in order to prevent STIs. Take aspirin only as told by your health care provider. Make sure that you understand how much to take and what form to take. Work with your health care provider to find out whether it is safe and beneficial for you to take aspirin daily. Ask your health care provider if you need to take a cholesterol-lowering medicine (statin). Find healthy ways to manage stress, such as: Meditation, yoga, or listening to music. Journaling. Talking to a trusted person. Spending time with friends and family. Minimize exposure to UV radiation to reduce your risk of skin cancer. Safety Always wear your seat belt while driving or riding in a vehicle. Do not drive: If you have been drinking alcohol. Do not ride with someone who has been drinking. When you are tired or distracted. While texting. If you have been using any mind-altering substances or drugs. Wear a helmet and other protective equipment during sports  activities. If you have firearms in your house, make sure you follow all gun safety procedures. What's next? Visit your health care provider once a year for an annual wellness visit. Ask your health care provider how often you should have your eyes and teeth checked. Stay up to date on all vaccines. This information is not intended to replace advice given to you by your health care provider. Make sure you discuss any questions you have with your health care provider. Document Revised: 07/23/2020 Document Reviewed: 07/23/2020 Elsevier Patient Education  [employer].

## 2021-03-11 NOTE — Assessment & Plan Note (Signed)
On Lipitor 20 mg daily.  Check lipids.  Continue lifestyle modifications.

## 2021-03-11 NOTE — Assessment & Plan Note (Signed)
On Synthroid 50 mcg daily.  Check TSH today. 

## 2021-03-11 NOTE — Assessment & Plan Note (Signed)
Doing well with Ambien 12.5 mg nightly as needed.

## 2021-03-11 NOTE — Assessment & Plan Note (Signed)
At goal on amlodipine-benazepril 5-10 once daily and HCTZ 12.5 once daily.  Check labs.

## 2021-03-11 NOTE — Progress Notes (Signed)
° °  Linda Knight is a 81 y.o. female who presents today for an office visit.  Assessment/Plan:  New/Acute Problems: Perforated TM No red flags.  Appears to be healing.  This should not cause any issues with her upcoming flight.  Chronic Problems Addressed Today: Pelvic floor dysfunction Follows with urogynecology.  Has some constipation issues.  She has been working on dietary modifications.  She is taking daily MiraLAX.  She has upcoming appointment with pelvic floor rehab in about 6 weeks.  B12 deficiency Check B12.  Insomnia Doing well with Ambien 12.5 mg nightly as needed.  Menopausal symptoms Follows with gynecology.  She is on Premarin.  Hypothyroidism On Synthroid 50 mcg daily.  Check TSH today.  Dyslipidemia On Lipitor 20 mg daily.  Check lipids.  Continue lifestyle modifications.  Essential hypertension At goal on amlodipine-benazepril 5-10 once daily and HCTZ 12.5 once daily.  Check labs.  Preventative health care Check labs. Pneumonia vaccine given today.    Subjective:  HPI:  See A/p for status of chronic conditions. She has no acute complaints today.  She will be flying soon and has concerns about perforated eardrum.  She had ear infection recently with perforation.  She has since recovered from this though perforation remains.       Objective:  Physical Exam: BP 122/77 (BP Location: Left Arm)    Pulse 67    Temp 97.8 F (36.6 C) (Temporal)    Ht 5' 4.17" (1.63 m)    Wt 133 lb 12.8 oz (60.7 kg)    SpO2 98%    BMI 22.84 kg/m   Gen: No acute distress, resting comfortably HEENT: Left TM with perforation.  Right TM clear. CV: Regular rate and rhythm with no murmurs appreciated Pulm: Normal work of breathing, clear to auscultation bilaterally with no crackles, wheezes, or rhonchi Neuro: Grossly normal, moves all extremities Psych: Normal affect and thought content      Linda Knight M. Jerline Pain, MD 03/11/2021 10:17 AM

## 2021-03-11 NOTE — Assessment & Plan Note (Signed)
Follows with urogynecology.  Has some constipation issues.  She has been working on dietary modifications.  She is taking daily MiraLAX.  She has upcoming appointment with pelvic floor rehab in about 6 weeks.

## 2021-03-11 NOTE — Assessment & Plan Note (Signed)
Follows with gynecology.  She is on Premarin.

## 2021-03-13 NOTE — Progress Notes (Signed)
Please inform patient of the following:  Good news! Her labs are all stable. Do not need to make any changes to her treatment plan at this time. Would like for her to keep working on diet and exercise and we can recheck in a year.  Algis Greenhouse. Jerline Pain, MD 03/13/2021 1:11 PM

## 2021-03-16 ENCOUNTER — Encounter: Payer: Self-pay | Admitting: Family Medicine

## 2021-03-23 ENCOUNTER — Telehealth: Payer: Self-pay

## 2021-03-23 MED ORDER — HYDROCHLOROTHIAZIDE 25 MG PO TABS: 12.5000 mg | ORAL_TABLET | Freq: Every day | ORAL | 3 refills | Status: DC

## 2021-03-23 NOTE — Telephone Encounter (Signed)
Ok with me. Please place any necessary orders. 

## 2021-03-23 NOTE — Telephone Encounter (Signed)
MEDICATION:hydrochlorothiazide (HYDRODIURIL) 25 MG tablet  PHARMACY:  [facility] [phone] - [place], [address] AT [address] Phone:  [phone]  Fax:  [phone]    Patient needs 30 days sent in. Patient also needs it sent in for  [place], [place] Phone:  [phone]  Fax:  [phone]      Comments:   **Let patient know to contact pharmacy at the end of the day to make sure medication is ready. **  ** Please notify patient to allow 48-72 hours to process**  **Encourage patient to contact the pharmacy for refills or they can request refills through [facility]**

## 2021-03-23 NOTE — Telephone Encounter (Signed)
Pt verified DOBl; Rx sent to [facility] per pt request.

## 2021-03-23 NOTE — Telephone Encounter (Signed)
See Note

## 2021-03-26 ENCOUNTER — Telehealth: Payer: Self-pay

## 2021-03-26 MED ORDER — ZOLPIDEM TARTRATE ER 12.5 MG PO TBCR
EXTENDED_RELEASE_TABLET | ORAL | 5 refills | Status: DC
Start: 2021-03-26 — End: 2021-03-27

## 2021-03-26 NOTE — Telephone Encounter (Signed)
Patient also needs refill on her  Zolpidem(Ambien) called into the pharmacy please.

## 2021-03-27 ENCOUNTER — Other Ambulatory Visit: Payer: Self-pay | Admitting: Family Medicine

## 2021-03-27 MED ORDER — ZOLPIDEM TARTRATE ER 12.5 MG PO TBCR: EXTENDED_RELEASE_TABLET | ORAL | 5 refills | Status: DC

## 2021-03-27 NOTE — Telephone Encounter (Signed)
Pt states she needed her refills of her medication sent to her local pharmacy instead of [facility] due to pt being out of medication.  MEDICATION:zolpidem (AMBIEN CR) 12.5 MG CR tablet  PHARMACY:  [facility] [phone] - [place], [place] AT [address] Phone:  [phone]  Fax:  [phone]

## 2021-03-27 NOTE — Addendum Note (Signed)
Addended by: Vivi Barrack on: 03/27/2021 11:21 AM   Modules accepted: Orders

## 2021-04-16 ENCOUNTER — Ambulatory Visit (INDEPENDENT_AMBULATORY_CARE_PROVIDER_SITE_OTHER): Payer: Medicare Other | Admitting: Family Medicine

## 2021-04-16 VITALS — BP 126/78 | HR 67 | Temp 98.0°F | Ht 64.0 in | Wt 130.0 lb

## 2021-04-16 DIAGNOSIS — J309 Allergic rhinitis, unspecified: Secondary | ICD-10-CM

## 2021-04-16 DIAGNOSIS — I10 Essential (primary) hypertension: Secondary | ICD-10-CM

## 2021-04-16 MED ORDER — AZITHROMYCIN 250 MG PO TABS: ORAL_TABLET | ORAL | 0 refills | Status: DC

## 2021-04-16 MED ORDER — AZELASTINE HCL 0.1 % NA SOLN: 2.0000 | Freq: Two times a day (BID) | NASAL | 12 refills | Status: DC

## 2021-04-16 NOTE — Patient Instructions (Signed)
It was very nice to see you today! ? ?You have a lot of sinus congestion.  I believe this is contributing some to your ear pain.  Please start the nasal spray.  Start the antibiotic if your symptoms are not improving in the next couple of days ? ?Let us know if your symptoms are not improving by next week. ? ?Take care, ?Dr Jerline Pain ? ?PLEASE NOTE: ? ?If you had any lab tests please let us know if you have not heard back within a few days. You may see your results on mychart before we have a chance to review them but we will give you a call once they are reviewed by [place]. If we ordered any referrals today, please let us know if you have not heard from their office within the next week.  ? ?Please try these tips to maintain a healthy lifestyle: ? ?Eat at least 3 REAL meals and 1-2 snacks per day.  Aim for no more than 5 hours between eating.  If you eat breakfast, please do so within one hour of getting up.  ? ?Each meal should contain half fruits/vegetables, one quarter protein, and one quarter carbs (no bigger than a computer mouse) ? ?Cut down on sweet beverages. This includes juice, soda, and sweet tea.  ? ?Drink at least 1 glass of water with each meal and aim for at least 8 glasses per day ? ?Exercise at least 150 minutes every week.   ?

## 2021-04-16 NOTE — Progress Notes (Signed)
? ?  Linda Knight is a 81 y.o. female who presents today for an office visit. ? ?Assessment/Plan:  ?New/Acute Problems: ?Sinusitis ?No red flags.  Will start Astelin nasal spray.  We will send pocket scription for azithromycin with instruction to not start unless symptoms do not improve the next few days.  She can continue over-the-counter meds.  Encouraged hydration.  She will let us know if not improving. ? ?Chronic Problems Addressed Today: ?Essential hypertension ?At goal on amlodipine-benazepril 5-10 once daily and HCTZ 12.5 mg once daily. ? ?Allergic rhinitis ?Likely contributing to her sinusitis.  We will start Astelin as above.  She can continue over-the-counter meds.  ? ?  ?Subjective:  ?HPI: ? ?Patient here with sore throat and ear pain. Her associated symptoms include nasal congestion. Symptoms started about 6 days ago. Symptoms seems to be getting better. She went to trip last week and was on plane. She thinks this could be contributing to the ear pain. Throat is very sore. She felt feverish when symptoms first  started. She denies any fever today. Lots of head congestion. She has tried Benadryl and Tynelol with no relief. She have a hx of seasonal allergies. She was tested negative for Covid. No fever, nausea or chills. No rhinorrhea. ? ?   ?  ?Objective:  ?Physical Exam: ?BP 126/78 (BP Location: Right Arm)   Pulse 67   Temp 98 ?F (36.7 ?C) (Temporal)   Ht '5\' 4"'$  (1.626 m)   Wt 130 lb (59 kg)   SpO2 98%   BMI 22.31 kg/m?   ?Gen: No acute distress, resting comfortably ?HEENT: Right TM with clear effusion.  Left TM with visualized perforation.  No obvious purulence.  OP erythematous.  Nasal mucosa erythematous and boggy bilaterally. ?CV: Regular rate and rhythm with no murmurs appreciated ?Pulm: Normal work of breathing, clear to auscultation bilaterally with no crackles, wheezes, or rhonchi ?Neuro: Grossly normal, moves all extremities ?Psych: Normal affect and thought content ? ?   ? ?I,Savera  Zaman,acting as a Education administrator for Dimas Chyle, MD.,have documented all relevant documentation on the behalf of Dimas Chyle, MD,as directed by  Dimas Chyle, MD while in the presence of Dimas Chyle, MD.  ? ?I, Dimas Chyle, MD, have reviewed all documentation for this visit. The documentation on 04/16/21 for the exam, diagnosis, procedures, and orders are all accurate and complete. ? ?Algis Greenhouse. Jerline Pain, MD ?04/16/2021 2:06 PM  ? ?

## 2021-04-16 NOTE — Assessment & Plan Note (Signed)
At goal on amlodipine-benazepril 5-10 once daily and HCTZ 12.5 mg once daily. ?

## 2021-04-16 NOTE — Assessment & Plan Note (Signed)
Likely contributing to her sinusitis.  We will start Astelin as above.  She can continue over-the-counter meds.  ?

## 2021-04-27 ENCOUNTER — Telehealth: Payer: Self-pay | Admitting: Family Medicine

## 2021-04-27 ENCOUNTER — Other Ambulatory Visit: Payer: Self-pay

## 2021-04-27 ENCOUNTER — Other Ambulatory Visit: Payer: Self-pay | Admitting: *Deleted

## 2021-04-27 NOTE — Telephone Encounter (Signed)
Ok with me. Please place any necessary orders. 

## 2021-04-27 NOTE — Telephone Encounter (Signed)
Spoke with patient, medication not in medication history  ?Patient stated been taking for pancreatitis and not longer seen by prescribe Dr   ?

## 2021-04-27 NOTE — Telephone Encounter (Signed)
.. ?  Encourage patient to contact the pharmacy for refills or they can request refills through [facility] ? ?LAST APPOINTMENT DATE:  04/16/21 ? ?NEXT APPOINTMENT DATE: none on file ? ?MEDICATION: creon 2400 unit capsule  ? ?Is the patient out of medication? yes ? ?[facility]  ? ?Let patient know to contact pharmacy at the end of the day to make sure medication is ready. ? ?Please notify patient to allow 48-72 hours to process  ? ? ?Note: Per patient Dr Jerline Pain has not prescribed this medicine to her,- she forgot to mention to dr Jerline Pain last [place]. -  ?

## 2021-04-28 ENCOUNTER — Other Ambulatory Visit: Payer: Self-pay

## 2021-04-28 MED ORDER — PANCRELIPASE (LIP-PROT-AMYL) 36000-114000 UNITS PO CPEP: ORAL_CAPSULE | ORAL | 11 refills | Status: DC

## 2021-04-28 NOTE — Telephone Encounter (Signed)
Rx sent to pharmacy with provider approval  ?

## 2021-05-18 ENCOUNTER — Ambulatory Visit (INDEPENDENT_AMBULATORY_CARE_PROVIDER_SITE_OTHER): Payer: Medicare Other | Admitting: Family Medicine

## 2021-05-18 DIAGNOSIS — H04123 Dry eye syndrome of bilateral lacrimal glands: Secondary | ICD-10-CM

## 2021-05-18 DIAGNOSIS — M25562 Pain in left knee: Secondary | ICD-10-CM

## 2021-05-18 DIAGNOSIS — I10 Essential (primary) hypertension: Secondary | ICD-10-CM

## 2021-05-18 DIAGNOSIS — M1990 Unspecified osteoarthritis, unspecified site: Secondary | ICD-10-CM

## 2021-05-18 MED ORDER — METHYLPREDNISOLONE ACETATE 80 MG/ML IJ SUSP
80.0000 mg | Freq: Once | INTRAMUSCULAR | Status: AC
  Administered 2021-05-18: 80 mg via INTRA_ARTICULAR

## 2021-05-18 NOTE — Assessment & Plan Note (Signed)
Mild flare over the last couple of days.  She may have some underlying degenerative meniscal tear as well.  We discussed conservative management including ice, compression, and home exercises.  She would like to have cortisone injection done today.  She tolerated this well.  See below procedure note.  She will let me know if not improving over the next few weeks and we can refer to PT and/or sports medicine. ?

## 2021-05-18 NOTE — Progress Notes (Signed)
? ?  Linda Knight is a 81 y.o. female who presents today for an office visit. ? ?Assessment/Plan:  ?Chronic Problems Addressed Today: ?Osteoarthritis ?Mild flare over the last couple of days.  She may have some underlying degenerative meniscal tear as well.  We discussed conservative management including ice, compression, and home exercises.  She would like to have cortisone injection done today.  She tolerated this well.  See below procedure note.  She will let me know if not improving over the next few weeks and we can refer to PT and/or sports medicine. ? ?Dry eyes ?We will refer to ophthalmology. ? ?Essential hypertension ?Elevated today in setting of acute pain.  Continue amlodipine-benazepril 5-10 once daily and HCTZ 12.5 mg once daily.  Continue home monitoring.  ? ? ?  ?Subjective:  ?HPI: ? ?Patient here with pain and swelling to left knee.  Started suddenly 2 days ago.  No obvious injuries or other precipitating events.  She has a known history of "bone-on-bone" arthritis.  She has had cortisone injections in the past which is done well.  She has been using ice which is helped. ? ?   ?  ?Objective:  ?Physical Exam: ?BP (!) 151/77 (BP Location: Left Arm)   Pulse 82   Temp 97.7 ?F (36.5 ?C) (Temporal)   Ht '5\' 4"'$  (1.626 m)   Wt 134 lb 6.4 oz (61 kg)   SpO2 99%   BMI 23.07 kg/m?   ?Gen: No acute distress, resting comfortably ?MSK: ?- Knees: Mild effusion noted in the left knee.  Crepitus with active and passive range of motion.  Stable to varus and valgus stress.  Neurovascular intact distally. ?Neuro: Grossly normal, moves all extremities ?Psych: Normal affect and thought content ? ?Knee Injection Procedure Note ? ?Indication: Symptom relief of left Knee Pain. ? ?Procedure Details  ?Verbal consent was obtained for the procedure. The joint was prepped with Betadine. Topical ethyl chloride was applied for anesthesia. A 22 gauge needle was inserted into the medial aspect of the joint.  3 ml 1%  lidocaine and 1 ml of '80mg'$ /cc Depo-Medrol was then injected into the joint. The needle was removed and the area cleansed and dressed. ? ?Complications:  None; patient tolerated the procedure well.  ?   ? ?Algis Greenhouse. Jerline Pain, MD ?05/18/2021 12:15 PM  ?

## 2021-05-18 NOTE — Patient Instructions (Signed)
It was very nice to see you today! ? ?I think you have an arthritis flare.  You may have meniscal tear.  We gave you a cortisone injection today.  Please work on the exercises.  Use ice and compression as well.  Let me know if not improving over the next week or so we can have you see PT or sports medicine. ? ?I will refer you to see an eye doctor. ? ?Take care, ?Dr Jerline Pain ? ?PLEASE NOTE: ? ?If you had any lab tests please let us know if you have not heard back within a few days. You may see your results on mychart before we have a chance to review them but we will give you a call once they are reviewed by [place]. If we ordered any referrals today, please let us know if you have not heard from their office within the next week.  ? ?Please try these tips to maintain a healthy lifestyle: ? ?Eat at least 3 REAL meals and 1-2 snacks per day.  Aim for no more than 5 hours between eating.  If you eat breakfast, please do so within one hour of getting up.  ? ?Each meal should contain half fruits/vegetables, one quarter protein, and one quarter carbs (no bigger than a computer mouse) ? ?Cut down on sweet beverages. This includes juice, soda, and sweet tea.  ? ?Drink at least 1 glass of water with each meal and aim for at least 8 glasses per day ? ?Exercise at least 150 minutes every week.   ?

## 2021-05-18 NOTE — Assessment & Plan Note (Signed)
We will refer to ophthalmology. ?

## 2021-05-18 NOTE — Assessment & Plan Note (Signed)
Elevated today in setting of acute pain.  Continue amlodipine-benazepril 5-10 once daily and HCTZ 12.5 mg once daily.  Continue home monitoring.  ?

## 2021-05-21 ENCOUNTER — Telehealth: Payer: Self-pay | Admitting: Family Medicine

## 2021-05-21 NOTE — Telephone Encounter (Signed)
Pt states Jerline Pain spoke with her regarding PT for her left knee. Pt initially declined but would like to go ahead with it. Please advise  ?

## 2021-05-21 NOTE — Telephone Encounter (Signed)
OK to place order for patient to receive PT?  ?

## 2021-05-25 ENCOUNTER — Other Ambulatory Visit: Payer: Self-pay

## 2021-05-25 DIAGNOSIS — M25562 Pain in left knee: Secondary | ICD-10-CM

## 2021-05-25 NOTE — Telephone Encounter (Signed)
Ok with me. Please place any necessary orders. 

## 2021-05-25 NOTE — Telephone Encounter (Signed)
Order and referral placed for PT per pt request  ?

## 2021-06-02 ENCOUNTER — Ambulatory Visit (INDEPENDENT_AMBULATORY_CARE_PROVIDER_SITE_OTHER): Payer: Medicare Other | Admitting: Physical Therapy

## 2021-06-02 ENCOUNTER — Encounter: Payer: Self-pay | Admitting: Physical Therapy

## 2021-06-02 DIAGNOSIS — R262 Difficulty in walking, not elsewhere classified: Secondary | ICD-10-CM

## 2021-06-02 DIAGNOSIS — G8929 Other chronic pain: Secondary | ICD-10-CM | POA: Diagnosis not present

## 2021-06-02 DIAGNOSIS — M6281 Muscle weakness (generalized): Secondary | ICD-10-CM

## 2021-06-02 DIAGNOSIS — M25562 Pain in left knee: Secondary | ICD-10-CM | POA: Diagnosis not present

## 2021-06-02 NOTE — Therapy (Signed)
?OUTPATIENT PHYSICAL THERAPY LOWER EXTREMITY EVALUATION ? ? ?Patient Name: Linda Knight ?MRN: [id number] ?DOB:[birth year], 81 y.o., female ?Today's Date: 06/02/2021 ? ? PT End of Session - 06/02/21 1508   ? ? Visit Number 1   ? Number of Visits 12   ? Date for PT Re-Evaluation 07/14/21   ? Authorization Type medicare/tricare   ? Progress Note Due on Visit 10   ? PT Start Time 1515   ? PT Stop Time 6063   ? PT Time Calculation (min) 34 min   ? ?  ?  ? ?  ? ? ?Past Medical History:  ?Diagnosis Date  ? Anemia   ? Arthritis   ? CAD (coronary artery disease)   ? High cholesterol   ? Hypertension   ? Pancreatic cyst   ? SCCA (squamous cell carcinoma) of skin 2019  ? Right Arm [facility])  ? Sleep apnea   ? Squamous cell carcinoma of skin 2019  ? Left Leg [facility])  ? Thyroid disease   ? ?Past Surgical History:  ?Procedure Laterality Date  ? ABDOMINAL HYSTERECTOMY    ? APPENDECTOMY    ? BIOPSY  09/27/2019  ? Procedure: BIOPSY;  Surgeon: Milus Banister, MD;  Location: [facility];  Service: Endoscopy;;  ? BREAST EXCISIONAL BIOPSY Right   ? BUNIONECTOMY    ? carcinoma removed Right 09/2020  ? CYSTOCELE REPAIR    ? bladder repair  ? ESOPHAGOGASTRODUODENOSCOPY (EGD) WITH PROPOFOL N/A 09/27/2019  ? Procedure: ESOPHAGOGASTRODUODENOSCOPY (EGD) WITH PROPOFOL;  Surgeon: Milus Banister, MD;  Location: [facility];  Service: Endoscopy;  Laterality: N/A;  ? EUS N/A 09/27/2019  ? Procedure: UPPER ENDOSCOPIC ULTRASOUND (EUS) RADIAL;  Surgeon: Milus Banister, MD;  Location: [facility];  Service: Endoscopy;  Laterality: N/A;  ? TYMPANOPLASTY Left 06/2020  ? VAGINAL PROLAPSE REPAIR  12/07/2019  ? ?Patient Active Problem List  ? Diagnosis Date Noted  ? Dry eyes 05/18/2021  ? Osteoarthritis 05/18/2021  ? Pelvic floor dysfunction 03/11/2021  ? Anxiety 09/15/2020  ? History of melanoma 11/14/2019  ? Pancreas cyst 07/17/2019  ? Stress 07/17/2019  ? B12 deficiency 05/29/2019  ? Chronic low back pain with sciatica 11/27/2018  ?  Tinnitus of both ears 09/19/2018  ? Perforation of left tympanic membrane 09/19/2018  ? Stenosis of carotid artery 09/19/2018  ? Female bladder prolapse 09/19/2018  ? Essential hypertension 09/19/2018  ? Dyslipidemia 09/19/2018  ? Hypothyroidism 09/19/2018  ? Menopausal symptoms 09/19/2018  ? Insomnia 09/19/2018  ? Allergic rhinitis 09/19/2018  ? Osteopenia 09/19/2018  ? ? ?PCP: Vivi Barrack, MD ? ?REFERRING PROVIDER: Vivi Barrack, MD ? ?REFERRING DIAG: M25.562 (ICD-10-CM) - Left knee pain, unspecified chronicity ? ?THERAPY DIAG:  ?Chronic pain of left knee - Plan: PT plan of care cert/re-cert ? ?Difficulty in walking, not elsewhere classified - Plan: PT plan of care cert/re-cert ? ?Muscle weakness (generalized) - Plan: PT plan of care cert/re-cert ? ?ONSET DATE: 2 weeks ago ? ?SUBJECTIVE:  ? ?SUBJECTIVE STATEMENT: ?States that she used to be a runner and has on and off knee pain for years. States her most recent episode occurred when she was walking on uneven ground and she thinks she twisted it. States standing still and walking bother it. States that when she sits for long periods of time also bother her. States that she got the cortisone injection and that took the edge off but didn't help significant. States MD thinks she hurt her cartilage and gave  her some exercises but she can only do the first one as they are painful. States she wants to avoid surgery ?  ?PERTINENT HISTORY: ?HTN, hx of left bunion surgery repair ? ?PAIN:  ?Are you having pain? Yes: NPRS scale: 8/10 ?Pain location: left knee ?Pain description: sharp,and painful  ?Aggravating factors: walking, standing and prolonged sitting ?Relieving factors: ice and rest ? ?PRECAUTIONS: None ? ?WEIGHT BEARING RESTRICTIONS No ? ?FALLS:  ?Has patient fallen in last 6 months? No ? ?LIVING ENVIRONMENT: ?Lives with: lives with their spouse ?Lives in: House/apartment ?Stairs: Yes: Internal: 12 steps; on left going up and External: 2 steps; none ?Has  following equipment at home: Gilford Rile - 2 wheeled cane ? ?OCCUPATION:  ? ?PLOF: Independent takes care of her dogs/cats. - used to walk up to a half mile ? ?PATIENT GOALS to have less pain and to be able to move around ? ? ?OBJECTIVE:  ? ?DIAGNOSTIC FINDINGS: no recent imaging ? ? ? ?COGNITION: ? Overall cognitive status: Within functional limits for tasks assessed   ?  ?SENSATION: ?WFL ? ? ?Edema:  ?At knee joint 33.8cm bilaterally ? ?PALPATION: ?Tenderness to palpation along medial left knee. Pocket of fluid in this area. ? ? ? ? LE Measurements ?Lower Extremity Right ?06/02/2021 Left ?06/02/2021  ? A/PROM MMT A/PROM MMT  ?Hip Flexion  4+  4+  ?Hip Extension      ?Hip Abduction      ?Hip Adduction      ?Hip Internal rotation      ?Hip External rotation      ?Knee Flexion 75 painfree/105 painful 4-* 140 4+  ?Knee Extension Lacking 10 4 Lacking 5 4+  ?Ankle Dorsiflexion  4+  5  ?Ankle Plantarflexion      ?Ankle Inversion      ?Ankle Eversion      ? (Blank rows = not tested) ? * pain ? ? ? ? ?GAIT: ?Distance walked: 25 feet ?Assistive device utilized: None ?Level of assistance: Modified independence ?Comments: antalgic, limited knee and hip ROM on right ? ? ? ?TODAY'S TREATMENT: ?06/02/2021 ?Therapeutic Exercise: ? Aerobic: ?Supine: AAROM heel slide with strap 2 minutes, quad sets R 2 minutes  ?Prone: ? Seated: ? Standing: ?Neuromuscular Re-education: ?Manual Therapy: ?Therapeutic Activity: ?Self Care: ?Trigger Point Dry Needling:  ?Modalities:  ? ? ? ?PATIENT EDUCATION:  ?Education details: on current presentation, on HEP, on clinical outcomes score and POC ?Person educated: Patient ?Education method: Explanation, Demonstration, and Handouts ?Education comprehension: verbalized understanding ? ? ?HOME EXERCISE PROGRAM: ?[id number] ? ?ASSESSMENT: ? ?CLINICAL IMPRESSION: ?Patient is a 81 y.o. female  who was seen today for physical therapy evaluation and treatment for left knee pain. Patient presents with weakness, swelling  and pain that is limiting her ability to function at home and in the community. Session focused on education and initiating HEP as handout from MD for HEP was too difficult for her. Patient would greatly benefit from skilled PT to improve overall function and QOL.  ? ? ?OBJECTIVE IMPAIRMENTS Abnormal gait, decreased activity tolerance, decreased balance, decreased endurance, decreased knowledge of use of DME, decreased mobility, difficulty walking, decreased ROM, decreased strength, increased edema, improper body mechanics, and pain.  ? ?ACTIVITY LIMITATIONS cleaning, community activity, driving, meal prep, and yard work.  ? ?PERSONAL FACTORS Age, Fitness, and Past/current experiences are also affecting patient's functional outcome.  ? ? ?REHAB POTENTIAL: Good ? ?CLINICAL DECISION MAKING: Stable/uncomplicated ? ?EVALUATION COMPLEXITY: Low ? ? ?GOALS: ?Goals reviewed with patient?  yes ? ?  SHORT TERM GOALS: ? ?Patient will be independent in self management strategies to improve quality of life and functional outcomes. ?Baseline: new program ?Target date: 06/23/2021 ?Goal status: INITIAL ? ?2.  Patient will report at least 50% improvement in overall symptoms and/or function to demonstrate improved functional mobility ?Baseline: 0% ?Target date: 06/23/2021 ?Goal status: INITIAL ? ?3.  Patient will be able to demonstrate at least 5-125 degrees of pain free left knee ROM ?Baseline: see above ?Target date: 06/23/2021 ?Goal status: INITIAL ? ? ? ?LONG TERM GOALS: ? ?Patient will report at least 75% improvement in overall symptoms and/or function to demonstrate improved functional mobility ?Baseline: 0% ?Target date: 07/14/2021 ?Goal status: INITIAL ? ?2.  Patient will be able to demonstrate at least 4+/5 MMT in lower legs to demonstrate improved leg strength ?Baseline: see above ?Target date: 07/14/2021 ?Goal status: INITIAL ? ?3.  Patient will be able to walk at least 1/2 mile on level ground without pain to return to walking  dogs without pain. ?Baseline: unable ?Target date: 07/14/2021 ?Goal status: INITIAL ? ? ? ? ? ?PLAN: ?PT FREQUENCY: 2x/week ? ?PT DURATION: 6 weeks ? ?PLANNED INTERVENTIONS: Therapeutic exercises, Therapeutic activ

## 2021-06-03 ENCOUNTER — Other Ambulatory Visit: Payer: Self-pay | Admitting: *Deleted

## 2021-06-03 ENCOUNTER — Ambulatory Visit (INDEPENDENT_AMBULATORY_CARE_PROVIDER_SITE_OTHER): Payer: Medicare Other | Admitting: Physical Therapy

## 2021-06-03 DIAGNOSIS — G8929 Other chronic pain: Secondary | ICD-10-CM

## 2021-06-03 DIAGNOSIS — M6281 Muscle weakness (generalized): Secondary | ICD-10-CM | POA: Diagnosis not present

## 2021-06-03 DIAGNOSIS — M25562 Pain in left knee: Secondary | ICD-10-CM | POA: Diagnosis not present

## 2021-06-03 DIAGNOSIS — R262 Difficulty in walking, not elsewhere classified: Secondary | ICD-10-CM

## 2021-06-03 MED ORDER — AZELASTINE HCL 0.1 % NA SOLN: 2.0000 | Freq: Two times a day (BID) | NASAL | 0 refills | Status: DC

## 2021-06-03 MED ORDER — HYDROCHLOROTHIAZIDE 25 MG PO TABS: 12.5000 mg | ORAL_TABLET | Freq: Every day | ORAL | 1 refills | Status: DC

## 2021-06-03 NOTE — Therapy (Addendum)
?OUTPATIENT PHYSICAL THERAPY LOWER EXTREMITY TREATMENT  ? ? ?Patient Name: Linda Knight ?MRN: [id number] ?DOB:March 21, 1940, 81 y.o., female ?Today's Date: 06/03/2021 ? ? PT End of Session - 06/06/21 2101   ? ? Visit Number 2   ? Number of Visits 12   ? Date for PT Re-Evaluation 07/14/21   ? Authorization Type medicare/tricare   ? Progress Note Due on Visit 10   ? PT Start Time 1515   ? PT Stop Time 6578   ? PT Time Calculation (min) 42 min   ? Activity Tolerance Patient tolerated treatment well   ? Behavior During Therapy [facility] for tasks assessed/performed   ? ?  ?  ? ?  ? ? ? ?Past Medical History:  ?Diagnosis Date  ? Anemia   ? Arthritis   ? CAD (coronary artery disease)   ? High cholesterol   ? Hypertension   ? Pancreatic cyst   ? SCCA (squamous cell carcinoma) of skin 2019  ? Right Arm [facility])  ? Sleep apnea   ? Squamous cell carcinoma of skin 2019  ? Left Leg [facility])  ? Thyroid disease   ? ?Past Surgical History:  ?Procedure Laterality Date  ? ABDOMINAL HYSTERECTOMY    ? APPENDECTOMY    ? BIOPSY  09/27/2019  ? Procedure: BIOPSY;  Surgeon: Milus Banister, MD;  Location: [facility];  Service: Endoscopy;;  ? BREAST EXCISIONAL BIOPSY Right   ? BUNIONECTOMY    ? carcinoma removed Right 09/2020  ? CYSTOCELE REPAIR    ? bladder repair  ? ESOPHAGOGASTRODUODENOSCOPY (EGD) WITH PROPOFOL N/A 09/27/2019  ? Procedure: ESOPHAGOGASTRODUODENOSCOPY (EGD) WITH PROPOFOL;  Surgeon: Milus Banister, MD;  Location: [facility];  Service: Endoscopy;  Laterality: N/A;  ? EUS N/A 09/27/2019  ? Procedure: UPPER ENDOSCOPIC ULTRASOUND (EUS) RADIAL;  Surgeon: Milus Banister, MD;  Location: [facility];  Service: Endoscopy;  Laterality: N/A;  ? TYMPANOPLASTY Left 06/2020  ? VAGINAL PROLAPSE REPAIR  12/07/2019  ? ?Patient Active Problem List  ? Diagnosis Date Noted  ? Dry eyes 05/18/2021  ? Osteoarthritis 05/18/2021  ? Pelvic floor dysfunction 03/11/2021  ? Anxiety 09/15/2020  ? History of melanoma 11/14/2019  ? Pancreas  cyst 07/17/2019  ? Stress 07/17/2019  ? B12 deficiency 05/29/2019  ? Chronic low back pain with sciatica 11/27/2018  ? Tinnitus of both ears 09/19/2018  ? Perforation of left tympanic membrane 09/19/2018  ? Stenosis of carotid artery 09/19/2018  ? Female bladder prolapse 09/19/2018  ? Essential hypertension 09/19/2018  ? Dyslipidemia 09/19/2018  ? Hypothyroidism 09/19/2018  ? Menopausal symptoms 09/19/2018  ? Insomnia 09/19/2018  ? Allergic rhinitis 09/19/2018  ? Osteopenia 09/19/2018  ? ? ?PCP: Vivi Barrack, MD ? ?REFERRING PROVIDER: Vivi Barrack, MD ? ?REFERRING DIAG: M25.562 (ICD-10-CM) - Left knee pain, unspecified chronicity ? ?THERAPY DIAG:  ?Chronic pain of left knee ? ?Difficulty in walking, not elsewhere classified ? ?Muscle weakness (generalized) ? ?ONSET DATE: 2 weeks ago ? ?SUBJECTIVE:  ? ?SUBJECTIVE STATEMENT: ?06/03/21: ?Pt states she was able to walk better this am. She has been trying to sit with knee bent some, but this is more painful than having it straight. Has been icing 3x/day.  ? ?Eval: States that she used to be a runner and has on and off knee pain for years. States her most recent episode occurred when she was walking on uneven ground and she thinks she twisted it. States standing still and walking bother it. States that  when she sits for long periods of time also bother her. States that she got the cortisone injection and that took the edge off but didn't help significant. States MD thinks she hurt her cartilage and gave her some exercises but she can only do the first one as they are painful. States she wants to avoid surgery ?  ?PERTINENT HISTORY: ?HTN, hx of left bunion surgery repair ? ?PAIN:  ?Are you having pain? Yes: NPRS scale: 8/10 ?Pain location: left knee ?Pain description: sharp,and painful  ?Aggravating factors: walking, standing and prolonged sitting ?Relieving factors: ice and rest ? ?PRECAUTIONS: None ? ?WEIGHT BEARING RESTRICTIONS No ? ?FALLS:  ?Has patient fallen in  last 6 months? No ? ?LIVING ENVIRONMENT: ?Lives with: lives with their spouse ?Lives in: House/apartment ?Stairs: Yes: Internal: 12 steps; on left going up and External: 2 steps; none ?Has following equipment at home: Gilford Rile - 2 wheeled cane ? ?OCCUPATION:  ? ?PLOF: Independent takes care of her dogs/cats. - used to walk up to a half mile ? ?PATIENT GOALS to have less pain and to be able to move around ? ? ?OBJECTIVE:  ? ?DIAGNOSTIC FINDINGS: no recent imaging ? ? ? ?COGNITION: ? Overall cognitive status: Within functional limits for tasks assessed   ?  ?SENSATION: ?WFL ? ? ?Edema:  ?At knee joint 33.8cm bilaterally ? ?PALPATION: ?Tenderness to palpation along medial left knee. Pocket of fluid in this area. ? ? ? ? LE Measurements ?Lower Extremity Right ?06/02/2021 Left ?06/02/2021  ? A/PROM MMT A/PROM MMT  ?Hip Flexion  4+  4+  ?Hip Extension      ?Hip Abduction      ?Hip Adduction      ?Hip Internal rotation      ?Hip External rotation      ?Knee Flexion 75 painfree/105 painful 4-* 140 4+  ?Knee Extension Lacking 10 4 Lacking 5 4+  ?Ankle Dorsiflexion  4+  5  ?Ankle Plantarflexion      ?Ankle Inversion      ?Ankle Eversion      ? (Blank rows = not tested) ? * pain ? ? ?GAIT: ?Distance walked: 25 feet ?Assistive device utilized: None ?Level of assistance: Modified independence ?Comments: antalgic, limited knee and hip ROM on right ? ? ?TODAY'S TREATMENT: ?06/03/2021 ?Therapeutic Exercise: ? Aerobic: ?Supine: AAROM heel slide x15 on L;  quad sets bil x 20 ; SLR 2x10 on L;  ?Prone: HSC x 10 on L;  ? Seated: LAQ (partial range) x 15 on L; sit to stand 2x 5, high mat table;  ? Standing: HR x 15; L/R weight shifts x 15; Stairs up/down 3 steps 4 in 1 HR, for education on limiting pressure on L knee.  ? ?Neuromuscular Re-education: ?Manual Therapy: ? ? ? ?PATIENT EDUCATION:  ?Education details: on current presentation, on HEP, on clinical outcomes score and POC ?Person educated: Patient ?Education method: Explanation,  Demonstration, and Handouts ?Education comprehension: verbalized understanding ? ? ?HOME EXERCISE PROGRAM: ?[id number] ? ?ASSESSMENT: ? ?CLINICAL IMPRESSION: ?Pt with good ability for ther ex today. She does have quite a bit of pain with increased flexion and weight bearing . Able to achieve Up to 130 today without pain, passive, and 95 deg active before pain. Education on stairs for optimal sequencing with painful knee at this time. Pt to benefit from progression of strengthening and flexion ROM as tolerated.  ? ?Patient is a 81 y.o. female  who was seen today for physical therapy evaluation and treatment for  left knee pain. Patient presents with weakness, swelling and pain that is limiting her ability to function at home and in the community. Session focused on education and initiating HEP as handout from MD for HEP was too difficult for her. Patient would greatly benefit from skilled PT to improve overall function and QOL.  ? ? ?OBJECTIVE IMPAIRMENTS Abnormal gait, decreased activity tolerance, decreased balance, decreased endurance, decreased knowledge of use of DME, decreased mobility, difficulty walking, decreased ROM, decreased strength, increased edema, improper body mechanics, and pain.  ? ?ACTIVITY LIMITATIONS cleaning, community activity, driving, meal prep, and yard work.  ? ?PERSONAL FACTORS Age, Fitness, and Past/current experiences are also affecting patient's functional outcome.  ? ? ?REHAB POTENTIAL: Good ? ?CLINICAL DECISION MAKING: Stable/uncomplicated ? ?EVALUATION COMPLEXITY: Low ? ? ?GOALS: ?Goals reviewed with patient?  yes ? ?SHORT TERM GOALS: ? ?Patient will be independent in self management strategies to improve quality of life and functional outcomes. ?Baseline: new program ?Target date: 06/27/2021 ?Goal status: INITIAL ? ?2.  Patient will report at least 50% improvement in overall symptoms and/or function to demonstrate improved functional mobility ?Baseline: 0% ?Target date: 06/27/2021 ?Goal  status: INITIAL ? ?3.  Patient will be able to demonstrate at least 5-125 degrees of pain free left knee ROM ?Baseline: see above ?Target date: 06/27/2021 ?Goal status: INITIAL ? ? ? ?LONG TERM GOALS: ? ?Pati

## 2021-06-06 ENCOUNTER — Encounter: Payer: Self-pay | Admitting: Physical Therapy

## 2021-06-08 ENCOUNTER — Encounter: Payer: Self-pay | Admitting: Physical Therapy

## 2021-06-08 ENCOUNTER — Ambulatory Visit (INDEPENDENT_AMBULATORY_CARE_PROVIDER_SITE_OTHER): Payer: Medicare Other | Admitting: Physical Therapy

## 2021-06-08 DIAGNOSIS — G8929 Other chronic pain: Secondary | ICD-10-CM | POA: Diagnosis not present

## 2021-06-08 DIAGNOSIS — M25562 Pain in left knee: Secondary | ICD-10-CM | POA: Diagnosis not present

## 2021-06-08 DIAGNOSIS — M6281 Muscle weakness (generalized): Secondary | ICD-10-CM

## 2021-06-08 DIAGNOSIS — R262 Difficulty in walking, not elsewhere classified: Secondary | ICD-10-CM

## 2021-06-08 HISTORY — PX: KNEE ARTHROSCOPY: SHX127

## 2021-06-08 NOTE — Therapy (Signed)
?OUTPATIENT PHYSICAL THERAPY LOWER EXTREMITY TREATMENT  ? ? ?Patient Name: Linda Knight ?MRN: [id number] ?DOB:1941-02-02, 81 y.o., female ?Today's Date: 06/08/2021 ? ? ? PT End of Session - 06/08/21 1620   ? ? Visit Number 3   ? Number of Visits 12   ? Date for PT Re-Evaluation 07/14/21   ? Authorization Type medicare/tricare   ? Progress Note Due on Visit 10   ? PT Start Time 1435   ? PT Stop Time 1515   ? PT Time Calculation (min) 40 min   ? Activity Tolerance Patient tolerated treatment well   ? Behavior During Therapy [facility] for tasks assessed/performed   ? ?  ?  ? ?  ? ? ? ? ?Past Medical History:  ?Diagnosis Date  ? Anemia   ? Arthritis   ? CAD (coronary artery disease)   ? High cholesterol   ? Hypertension   ? Pancreatic cyst   ? SCCA (squamous cell carcinoma) of skin 2019  ? Right Arm [facility])  ? Sleep apnea   ? Squamous cell carcinoma of skin 2019  ? Left Leg [facility])  ? Thyroid disease   ? ?Past Surgical History:  ?Procedure Laterality Date  ? ABDOMINAL HYSTERECTOMY    ? APPENDECTOMY    ? BIOPSY  09/27/2019  ? Procedure: BIOPSY;  Surgeon: Milus Banister, MD;  Location: [facility];  Service: Endoscopy;;  ? BREAST EXCISIONAL BIOPSY Right   ? BUNIONECTOMY    ? carcinoma removed Right 09/2020  ? CYSTOCELE REPAIR    ? bladder repair  ? ESOPHAGOGASTRODUODENOSCOPY (EGD) WITH PROPOFOL N/A 09/27/2019  ? Procedure: ESOPHAGOGASTRODUODENOSCOPY (EGD) WITH PROPOFOL;  Surgeon: Milus Banister, MD;  Location: [facility];  Service: Endoscopy;  Laterality: N/A;  ? EUS N/A 09/27/2019  ? Procedure: UPPER ENDOSCOPIC ULTRASOUND (EUS) RADIAL;  Surgeon: Milus Banister, MD;  Location: [facility];  Service: Endoscopy;  Laterality: N/A;  ? TYMPANOPLASTY Left 06/2020  ? VAGINAL PROLAPSE REPAIR  12/07/2019  ? ?Patient Active Problem List  ? Diagnosis Date Noted  ? Dry eyes 05/18/2021  ? Osteoarthritis 05/18/2021  ? Pelvic floor dysfunction 03/11/2021  ? Anxiety 09/15/2020  ? History of melanoma 11/14/2019  ? Pancreas  cyst 07/17/2019  ? Stress 07/17/2019  ? B12 deficiency 05/29/2019  ? Chronic low back pain with sciatica 11/27/2018  ? Tinnitus of both ears 09/19/2018  ? Perforation of left tympanic membrane 09/19/2018  ? Stenosis of carotid artery 09/19/2018  ? Female bladder prolapse 09/19/2018  ? Essential hypertension 09/19/2018  ? Dyslipidemia 09/19/2018  ? Hypothyroidism 09/19/2018  ? Menopausal symptoms 09/19/2018  ? Insomnia 09/19/2018  ? Allergic rhinitis 09/19/2018  ? Osteopenia 09/19/2018  ? ? ?PCP: Vivi Barrack, MD ? ?REFERRING PROVIDER: Vivi Barrack, MD ? ?REFERRING DIAG: M25.562 (ICD-10-CM) - Left knee pain, unspecified chronicity ? ?THERAPY DIAG:  ?Chronic pain of left knee ? ?Difficulty in walking, not elsewhere classified ? ?Muscle weakness (generalized) ? ?ONSET DATE: 2 weeks ago ? ?SUBJECTIVE:  ? ?SUBJECTIVE STATEMENT: ?Pt states slightly improved pain. She still has most pain with walking and bending knee.  ? ?Eval: States that she used to be a runner and has on and off knee pain for years. States her most recent episode occurred when she was walking on uneven ground and she thinks she twisted it. States standing still and walking bother it. States that when she sits for long periods of time also bother her. States that she got the cortisone  injection and that took the edge off but didn't help significant. States MD thinks she hurt her cartilage and gave her some exercises but she can only do the first one as they are painful. States she wants to avoid surgery ?  ?PERTINENT HISTORY: ?HTN, hx of left bunion surgery repair ? ?PAIN:  ?Are you having pain? Yes: NPRS scale: 8/10 ?Pain location: left knee ?Pain description: sharp,and painful  ?Aggravating factors: walking, standing and prolonged sitting ?Relieving factors: ice and rest ? ?PRECAUTIONS: None ? ?WEIGHT BEARING RESTRICTIONS No ? ?FALLS:  ?Has patient fallen in last 6 months? No ? ?LIVING ENVIRONMENT: ?Lives with: lives with their spouse ?Lives in:  House/apartment ?Stairs: Yes: Internal: 12 steps; on left going up and External: 2 steps; none ?Has following equipment at home: Gilford Rile - 2 wheeled cane ? ?OCCUPATION:  ? ?PLOF: Independent takes care of her dogs/cats. - used to walk up to a half mile ? ?PATIENT GOALS to have less pain and to be able to move around ? ? ?OBJECTIVE:  ? ?DIAGNOSTIC FINDINGS: no recent imaging ? ? ? ?COGNITION: ? Overall cognitive status: Within functional limits for tasks assessed   ?  ?SENSATION: ?WFL ? ? ?Edema:  ?At knee joint 33.8cm bilaterally ? ?PALPATION: ?Tenderness to palpation along medial left knee. Pocket of fluid in this area. ? ? ? ? LE Measurements ?Lower Extremity Left ?06/02/2021 Right ?06/02/2021 Left ?06/08/21  ? A/PROM MMT A/PROM MMT   ?Hip Flexion  4+  4+   ?Hip Extension       ?Hip Abduction       ?Hip Adduction       ?Hip Internal rotation       ?Hip External rotation       ?Knee Flexion 75 painfree/105 painful 4-* 140 4+ 130 deg, mild pain  ?Knee Extension Lacking 10 4 Lacking 5 4+ -3  ?Ankle Dorsiflexion  4+  5   ?Ankle Plantarflexion       ?Ankle Inversion       ?Ankle Eversion       ? (Blank rows = not tested) ? * pain ? ? ?GAIT: ?Distance walked: 25 feet ?Assistive device utilized: None ?Level of assistance: Modified independence ?Comments: antalgic, limited knee and hip ROM on right ? ? ?TODAY'S TREATMENT: ?06/08/2021 ?Therapeutic Exercise: ? Aerobic: ?Supine: AAROM heel slide x15 on L;  quad sets bil x 20 ; SLR 1 x10 bil; S/L hip abd 3x5  on L;  ?Prone: HSC x 10 on L (pain)  ? Seated: Heel slides x 15 on L;  LAQ  x 15 on L;  sit to stand 2x 5, high mat table;  ? Standing:  March x 15;  ? ?Neuromuscular Re-education: ?Manual Therapy: PROM L knee for flexion, light distraction and mobilization to inc L knee flexion;  STM to medial/posterior knee  ? ? ? ?PATIENT EDUCATION:  ?Education details: reviewed HEP ?Person educated: Patient ?Education method: Explanation, Demonstration, and Handouts ?Education  comprehension: verbalized understanding ? ? ?HOME EXERCISE PROGRAM: ?[id number] ? ?ASSESSMENT: ? ?CLINICAL IMPRESSION: ?Pt with most pain in medial condyle region, with palpation and with increased flexion. She is doing better with ability for weight bearing today, and does have increased ability for flexion ROM from last week. Plan to progress ROM and strengthening as tolerated.  ? ?Patient is a 81 y.o. female  who was seen today for physical therapy evaluation and treatment for left knee pain. Patient presents with weakness, swelling and pain that is limiting  her ability to function at home and in the community. Session focused on education and initiating HEP as handout from MD for HEP was too difficult for her. Patient would greatly benefit from skilled PT to improve overall function and QOL.  ? ? ?OBJECTIVE IMPAIRMENTS Abnormal gait, decreased activity tolerance, decreased balance, decreased endurance, decreased knowledge of use of DME, decreased mobility, difficulty walking, decreased ROM, decreased strength, increased edema, improper body mechanics, and pain.  ? ?ACTIVITY LIMITATIONS cleaning, community activity, driving, meal prep, and yard work.  ? ?PERSONAL FACTORS Age, Fitness, and Past/current experiences are also affecting patient's functional outcome.  ? ? ?REHAB POTENTIAL: Good ? ?CLINICAL DECISION MAKING: Stable/uncomplicated ? ?EVALUATION COMPLEXITY: Low ? ? ?GOALS: ?Goals reviewed with patient?  yes ? ?SHORT TERM GOALS: ? ?Patient will be independent in self management strategies to improve quality of life and functional outcomes. ?Baseline: new program ?Target date: 06/29/2021 ?Goal status: INITIAL ? ?2.  Patient will report at least 50% improvement in overall symptoms and/or function to demonstrate improved functional mobility ?Baseline: 0% ?Target date: 06/29/2021 ?Goal status: INITIAL ? ?3.  Patient will be able to demonstrate at least 5-125 degrees of pain free left knee ROM ?Baseline: see  above ?Target date: 06/29/2021 ?Goal status: INITIAL ? ? ? ?LONG TERM GOALS: ? ?Patient will report at least 75% improvement in overall symptoms and/or function to demonstrate improved functional mobility ?Baseline: 0% ?T

## 2021-06-15 ENCOUNTER — Ambulatory Visit (INDEPENDENT_AMBULATORY_CARE_PROVIDER_SITE_OTHER): Payer: Medicare Other | Admitting: Family Medicine

## 2021-06-15 ENCOUNTER — Encounter: Payer: Medicare Other | Admitting: Physical Therapy

## 2021-06-15 VITALS — BP 144/86 | HR 67 | Temp 98.2°F | Ht 64.0 in | Wt 137.2 lb

## 2021-06-15 DIAGNOSIS — M1990 Unspecified osteoarthritis, unspecified site: Secondary | ICD-10-CM | POA: Diagnosis not present

## 2021-06-15 DIAGNOSIS — N951 Menopausal and female climacteric states: Secondary | ICD-10-CM | POA: Diagnosis not present

## 2021-06-15 MED ORDER — HYDROCODONE-ACETAMINOPHEN 5-325 MG PO TABS: 1.0000 | ORAL_TABLET | Freq: Four times a day (QID) | ORAL | 0 refills | Status: DC | PRN

## 2021-06-15 MED ORDER — ESTROGENS CONJUGATED 0.625 MG/GM VA CREA: 1.0000 | TOPICAL_CREAM | VAGINAL | 12 refills | Status: DC

## 2021-06-15 MED ORDER — CELECOXIB 200 MG PO CAPS: 200.0000 mg | ORAL_CAPSULE | Freq: Two times a day (BID) | ORAL | 0 refills | Status: DC

## 2021-06-15 NOTE — Assessment & Plan Note (Signed)
Premarin vaginal cream refilled.  ?

## 2021-06-15 NOTE — Progress Notes (Signed)
? ?  Linda Knight is a 81 y.o. female who presents today for an office visit. ? ?Assessment/Plan:  ?Chronic Problems Addressed Today: ?Osteoarthritis ?Recent flare after working out in the garden over the weekend. Has a significant amount of pain today which is interfering with her ability to walk.  She also has has minimal effusion.  We will start Celebrex 200 mg twice daily.  We will also send in small supply of Norco to use as needed for breakthrough pain.  She has been working with physical therapy however is not able to work with them today due to her degree of pain. ? ?We will refer to sports medicine for further evaluation. ? ?Menopausal symptoms ?Premarin vaginal cream refilled.  ? ? ?  ?Subjective:  ?HPI: ? ?See A/P for status chronic conditions.  Patient is here with pain in her left knee.  We saw her about a month ago for arthritis pain in her left knee.  She received steroid injection that time which worked well however pain has returned.  She thinks she may have aggravated it with working out the garden yesterday.  No obvious injuries or precipitating events.  She has tried using ice compression without much improvement.  She has been using over-the-counter anti-inflammatories without any improvement at all.  Some swelling. ? ?   ?  ?Objective:  ?Physical Exam: ?BP (!) 144/86 (BP Location: Right Arm)   Pulse 67   Temp 98.2 ?F (36.8 ?C) (Temporal)   Ht '5\' 4"'$  (1.626 m)   Wt 137 lb 3.2 oz (62.2 kg)   SpO2 98%   BMI 23.55 kg/m?   ?Gen: No acute distress, resting comfortably ?MSK: ?- Left Knee: Slight effusion noted.  Crepitus with active and passive range of motion.  Stable to varus and valgus stress.  Anterior and posterior drawer signs negative bilaterally.  Pain with palpation of right lateral joint space. ?Neuro: Grossly normal, moves all extremities ?Psych: Normal affect and thought content ? ?   ? ?Algis Greenhouse. Jerline Pain, MD ?06/15/2021 3:05 PM  ?

## 2021-06-15 NOTE — Patient Instructions (Signed)
It was very nice to see you today! ? ?Please start the Celebrex twice daily for the next week or so.  You can use the Norco as needed.  Please continue using ice and compression as tolerated. ? ?I will refer you to see the sports medicine doctors for further evaluation. ? ?Take care, ?Dr Jerline Pain ? ?PLEASE NOTE: ? ?If you had any lab tests please let us know if you have not heard back within a few days. You may see your results on mychart before we have a chance to review them but we will give you a call once they are reviewed by [place]. If we ordered any referrals today, please let us know if you have not heard from their office within the next week.  ? ?Please try these tips to maintain a healthy lifestyle: ? ?Eat at least 3 REAL meals and 1-2 snacks per day.  Aim for no more than 5 hours between eating.  If you eat breakfast, please do so within one hour of getting up.  ? ?Each meal should contain half fruits/vegetables, one quarter protein, and one quarter carbs (no bigger than a computer mouse) ? ?Cut down on sweet beverages. This includes juice, soda, and sweet tea.  ? ?Drink at least 1 glass of water with each meal and aim for at least 8 glasses per day ? ?Exercise at least 150 minutes every week.   ?

## 2021-06-15 NOTE — Assessment & Plan Note (Addendum)
Recent flare after working out in the garden over the weekend. Has a significant amount of pain today which is interfering with her ability to walk.  She also has has minimal effusion.  We will start Celebrex 200 mg twice daily.  We will also send in small supply of Norco to use as needed for breakthrough pain.  She has been working with physical therapy however is not able to work with them today due to her degree of pain. ? ?We will refer to sports medicine for further evaluation. ?

## 2021-06-16 NOTE — Progress Notes (Signed)
? ? ?Subjective:   ? ?CC: Linda Knight ? ?I, Linda Knight, LAT, ATC, am serving as scribe for Dr. Lynne Leader. ? ?HPI: Pt is a 81 y/o female c/o Linda Knight due to OA/DJD.  Pt reports a couple weeks ago she was picking up debris from her yard and she thinks she may have "twisted" her Linda knee. Pt notes the next day, her knee was very swollen and it was very hard to flex. She locates her Knight to the medial joint line and lateral to the patella. Pt had prior steroid injection by PCP.  ? ?Linda knee swelling: yes ?Linda knee mechanical symptoms: yes ?Radiating Knight: yes- when really painful ?Aggravating factors: lateral movements, sitting w/ knee flexed ?Treatments tried: PT that she is currently attending; celebrex; Norco; steroid injection on 05/18/21; ice ? ?Pertinent review of Systems: No fevers or chills ? ?Relevant historical information: Hypertension and osteopenia. ? ? ?Objective:   ? ?Vitals:  ? 06/17/21 1058  ?BP: 136/82  ?Pulse: 91  ?SpO2: 98%  ? ?General: Well Developed, well nourished, and in no acute distress.  ? ?MSK: Left knee: Mild effusion decreased range of motion 0-90 degrees.  Very tender to palpation at medial joint line. ?Decrease strength to extension and flexion with Knight.  Significantly positive McMurray's test. ?Knight and guarding but no significant laxity to valgus and varus stress test. ? ?Lab and Radiology Results ? ?Diagnostic Limited MSK Ultrasound of: Left knee ?Tendon intact normal-appearing. ?Small joint effusion is present. ?Patellar tendon normal. ?Medial joint line narrowed degenerative appearing medial meniscus with calcific crystals present within the meniscus visible. ?Lateral joint line normal appearing of lateral meniscus with the exception of calcific crystals also in the lateral meniscus. ?Posterior knee no Baker's cyst. ?Impression: Calcinosis and medial DJD ? ?X-ray images left knee obtained today personally and independently interpreted ?Mild medial DJD.  Chondrocalcinosis  present medial and lateral.  No acute fractures are present. ?Await formal radiology review ? ? ?Impression and Recommendations:   ? ?Assessment and Plan: ?81 y.o. female with left knee Knight.  Patient has had an acute exacerbation of her knee Knight.  This is concerning for medial meniscus tear or exacerbation of underlying DJD or even pseudogout based on her chondrocalcinosis. ?She had a steroid injection about a month ago that helped only for a few days.  I think the injection was technically successful just in the last ? ?Plan for hyaluronic acid injections.  We will work on authorization for that now.  Additionally will prescribe colchicine for possible pseudogout based on chondrocalcinosis. ?Anticipate return to clinic soon as hopefully will be able to get hyaluronic acid injections authorized ? ?PDMP not reviewed this encounter. ?Orders Placed This Encounter  ?Procedures  ? [place] LIMITED JOINT SPACE STRUCTURES LOW LEFT(NO LINKED CHARGES)  ?  Order Specific Question:   Reason for Exam (SYMPTOM  OR DIAGNOSIS REQUIRED)  ?  Answer:   Linda Knight  ?  Order Specific Question:   Preferred imaging location?  ?  Answer:   [facility]  ? DG Knee AP/LAT W/[place] Left  ?  Standing Status:   Future  ?  Number of Occurrences:   1  ?  Standing Expiration Date:   07/17/2021  ?  Order Specific Question:   Reason for Exam (SYMPTOM  OR DIAGNOSIS REQUIRED)  ?  Answer:   Linda Knight  ?  Order Specific Question:   Preferred imaging location?  ?  Answer:  [facility]  ? ?Meds ordered this encounter  ?Medications  ? colchicine 0.6 MG tablet  ?  Sig: Take 0.5 tablets (0.3 mg total) by mouth daily as needed (gout or psuedogout Knight).  ?  Dispense:  30 tablet  ?  Refill:  2  ? ? ?Discussed warning signs or symptoms. Please see discharge instructions. Patient expresses understanding. ? ? ?The above documentation has been reviewed and is accurate and complete Lynne Leader, M.D. ? ?

## 2021-06-17 ENCOUNTER — Ambulatory Visit: Payer: Self-pay

## 2021-06-17 ENCOUNTER — Encounter: Payer: Medicare Other | Admitting: Physical Therapy

## 2021-06-17 ENCOUNTER — Ambulatory Visit (INDEPENDENT_AMBULATORY_CARE_PROVIDER_SITE_OTHER): Payer: Medicare Other | Admitting: Family Medicine

## 2021-06-17 ENCOUNTER — Ambulatory Visit (INDEPENDENT_AMBULATORY_CARE_PROVIDER_SITE_OTHER): Payer: Medicare Other

## 2021-06-17 VITALS — BP 136/82 | HR 91 | Ht 64.0 in | Wt 139.4 lb

## 2021-06-17 DIAGNOSIS — M25562 Pain in left knee: Secondary | ICD-10-CM | POA: Diagnosis not present

## 2021-06-17 DIAGNOSIS — G8929 Other chronic pain: Secondary | ICD-10-CM | POA: Diagnosis not present

## 2021-06-17 DIAGNOSIS — M11262 Other chondrocalcinosis, left knee: Secondary | ICD-10-CM

## 2021-06-17 MED ORDER — COLCHICINE 0.6 MG PO TABS
0.3000 mg | ORAL_TABLET | Freq: Every day | ORAL | 2 refills | Status: DC | PRN
Start: 2021-06-17 — End: 2022-03-24

## 2021-06-17 NOTE — Patient Instructions (Addendum)
Thank you for coming in today.  ? ?Please get an Xray today before you leave  ? ?Please use Voltaren gel (Generic Diclofenac Gel) up to 4x daily for pain as needed.  This is available over-the-counter as both the name brand Voltaren gel and the generic diclofenac gel.  ? ?We will work to get the gel shots authorized. You will hear from [place] when we get approval. ? ?I've sent a prescription for colchicine to your pharmacy.  ? ? ?

## 2021-06-19 NOTE — Progress Notes (Signed)
Left knee x-ray shows some degenerative changes.  No fractures are visible.

## 2021-06-22 ENCOUNTER — Ambulatory Visit: Payer: Medicare Other | Admitting: Family Medicine

## 2021-06-22 ENCOUNTER — Encounter: Payer: Medicare Other | Admitting: Physical Therapy

## 2021-06-24 ENCOUNTER — Encounter: Payer: Medicare Other | Admitting: Physical Therapy

## 2021-06-29 ENCOUNTER — Encounter: Payer: Medicare Other | Admitting: Physical Therapy

## 2021-07-01 ENCOUNTER — Encounter: Payer: Medicare Other | Admitting: Physical Therapy

## 2021-07-02 NOTE — Progress Notes (Signed)
Linda Knight D.[place] Phone: [phone]   Assessment and Plan:     1. Chronic pain of left knee 2. Chondrocalcinosis of left knee -Chronic with exacerbation, subsequent visit - Continued pain of left knee consistent with osteoarthritis of medial compartment and chondrocalcinosis - Patient had minimal relief entheses maybe 1 to 2 weeks) after intra-articular CSI. - Patient elects for intra-articular HA injection.  Tolerated well per note below - Start HEP for range of motion and prevent muscular weakening  Procedure: Knee Joint Injection Side: Left Indication: Flare of chronic left knee pain  Risks explained and consent was given verbally. The site was cleaned with alcohol prep. A needle was introduced with an anterio-lateral approach. Injection given using 4 ml of Monovisc 22 mg/ML. This was well tolerated and resulted in symptomatic relief.  Needle was removed, hemostasis achieved, and post injection instructions were explained.   Pt was advised to call or return to clinic if these symptoms worsen or fail to improve as anticipated.    Pertinent previous records reviewed include none   Follow Up: 3 weeks for reevaluation.  If no improvement or worsening of symptoms, could discuss advanced imaging versus PRP injection   Subjective:   I, Linda Knight, am serving as a Education administrator for [employer]  Chief Complaint: Left knee pain wants gel injection   HPI:  06/17/2021 Pt is a 81 y/o female c/o L knee pain due to OA/DJD.  Pt reports a couple weeks ago she was picking up debris from her yard and she thinks she may have "twisted" her L knee. Pt notes the next day, her knee was very swollen and it was very hard to flex. She locates her pain to the medial joint line and lateral to the patella. Pt had prior steroid injection by PCP.    L knee swelling: yes L knee mechanical symptoms: yes Radiating  pain: yes- when really painful Aggravating factors: lateral movements, sitting w/ knee flexed Treatments tried: PT that she is currently attending; celebrex; Norco; steroid injection on 05/18/21; ice   07/07/2021 Patient states that she wants to talk about other options than gel injection she is in constant pain , she does not want surgery but she is will to discuss so pain is gone   Relevant Historical Information: Hypothyroidism, hypertension  Additional pertinent review of systems negative.   Current Outpatient Medications:    amLODipine-benazepril (LOTREL) 5-10 MG capsule, Take 1 capsule by mouth daily., Disp: 90 capsule, Rfl: 3   aspirin EC 81 MG tablet, Take 81 mg by mouth daily. Swallow whole., Disp: , Rfl:    atorvastatin (LIPITOR) 20 MG tablet, Take 1 tablet (20 mg total) by mouth daily., Disp: 90 tablet, Rfl: 2   azelastine (ASTELIN) 0.1 % nasal spray, Place 2 sprays into both nostrils 2 (two) times daily., Disp: 54 mL, Rfl: 0   celecoxib (CELEBREX) 200 MG capsule, Take 1 capsule (200 mg total) by mouth 2 (two) times daily., Disp: 60 capsule, Rfl: 0   citalopram (CELEXA) 10 MG tablet, Take 1 tablet (10 mg total) by mouth daily., Disp: 90 tablet, Rfl: 1   colchicine 0.6 MG tablet, Take 0.5 tablets (0.3 mg total) by mouth daily as needed (gout or psuedogout pain)., Disp: 30 tablet, Rfl: 2   conjugated estrogens (PREMARIN) vaginal cream, Place 1 Applicatorful vaginally once a week., Disp: 42.5 g, Rfl: 12   estrogens, conjugated, (PREMARIN) 0.3 MG tablet,  Take 0.3 mg by mouth every Monday, Wednesday, and Friday. , Disp: , Rfl:    hydrochlorothiazide (HYDRODIURIL) 25 MG tablet, Take 0.5 tablets (12.5 mg total) by mouth daily., Disp: 90 tablet, Rfl: 1   HYDROcodone-acetaminophen (NORCO) 5-325 MG tablet, Take 1 tablet by mouth every 6 (six) hours as needed for moderate pain., Disp: 30 tablet, Rfl: 0   levothyroxine (SYNTHROID) 50 MCG tablet, Take 1 tablet (50 mcg total) by mouth daily.,  Disp: 90 tablet, Rfl: 3   lipase/protease/amylase (CREON) 36000 UNITS CPEP capsule, Take 2 capsules (72,000 Units total) by mouth 3 (three) times daily with meals. May also take 1 capsule (36,000 Units total) as needed (with snacks)., Disp: 240 capsule, Rfl: 11   zolpidem (AMBIEN CR) 12.5 MG CR tablet, TAKE 1 TABLET(12.5 MG) BY MOUTH AT BEDTIME AS NEEDED FOR SLEEP, Disp: 30 tablet, Rfl: 5   Objective:     Vitals:   07/07/21 1016  BP: 130/78  Pulse: 71  SpO2: 97%  Weight: 136 lb (61.7 kg)  Height: '5\' 4"'$  (1.626 m)      Body mass index is 23.34 kg/m.    Physical Exam:    General:  awake, alert oriented, no acute distress nontoxic Skin: no suspicious lesions or rashes Neuro:sensation intact, no deficits, strength 5/5 with no deficits, no atrophy, normal muscle tone Psych: No signs of anxiety, depression or other mood disorder  Left knee: No swelling No deformity Neg fluid wave, joint milking ROM Flex 80, Ext 10 TTP medial femoral condyle, medial joint line NTTP over the quad tendon,  , lat fem condyle, patella, plica, patella tendon, tibial tuberostiy, fibular head, posterior fossa, pes anserine bursa, gerdy's tubercle,   lateral jt line Neg anterior and posterior drawer Neg lachman Neg sag sign Negative varus stress Negative valgus stress Negative McMurray    Gait normal    Electronically signed by:  Linda Knight D.[facility] 10:43 AM 07/07/21

## 2021-07-07 ENCOUNTER — Ambulatory Visit (INDEPENDENT_AMBULATORY_CARE_PROVIDER_SITE_OTHER): Payer: Medicare Other | Admitting: Sports Medicine

## 2021-07-07 ENCOUNTER — Encounter: Payer: Medicare Other | Admitting: Physical Therapy

## 2021-07-07 VITALS — BP 130/78 | HR 71 | Ht 64.0 in | Wt 136.0 lb

## 2021-07-07 DIAGNOSIS — M25562 Pain in left knee: Secondary | ICD-10-CM

## 2021-07-07 DIAGNOSIS — M1712 Unilateral primary osteoarthritis, left knee: Secondary | ICD-10-CM

## 2021-07-07 DIAGNOSIS — M11262 Other chondrocalcinosis, left knee: Secondary | ICD-10-CM | POA: Diagnosis not present

## 2021-07-07 DIAGNOSIS — G8929 Other chronic pain: Secondary | ICD-10-CM | POA: Diagnosis not present

## 2021-07-07 NOTE — Patient Instructions (Addendum)
Good to see you  Knee HEP 3 week follow

## 2021-07-09 ENCOUNTER — Encounter: Payer: Medicare Other | Admitting: Physical Therapy

## 2021-07-13 ENCOUNTER — Encounter: Payer: Medicare Other | Admitting: Physical Therapy

## 2021-07-15 ENCOUNTER — Encounter: Payer: Medicare Other | Admitting: Physical Therapy

## 2021-07-27 NOTE — Progress Notes (Unsigned)
Benito Mccreedy D.[facility] Phone: [phone]   Assessment and Plan:     There are no diagnoses linked to this encounter.  ***   Pertinent previous records reviewed include ***   Follow Up: ***     Subjective:   I, Linda Knight, am serving as a Education administrator for [employer]   Chief Complaint: Left knee pain wants gel injection    HPI:  06/17/2021 Pt is a 81 y/o female c/o L knee pain due to OA/DJD.  Pt reports a couple weeks ago she was picking up debris from her yard and she thinks she may have "twisted" her L knee. Pt notes the next day, her knee was very swollen and it was very hard to flex. She locates her pain to the medial joint line and lateral to the patella. Pt had prior steroid injection by PCP.    L knee swelling: yes L knee mechanical symptoms: yes Radiating pain: yes- when really painful Aggravating factors: lateral movements, sitting w/ knee flexed Treatments tried: PT that she is currently attending; celebrex; Norco; steroid injection on 05/18/21; ice     07/07/2021 Patient states that she wants to talk about other options than gel injection she is in constant pain , she does not want surgery but she is will to discuss so pain is gone   07/28/2021 Patient states    Relevant Historical Information: Hypothyroidism, hypertension  Additional pertinent review of systems negative.   Current Outpatient Medications:    amLODipine-benazepril (LOTREL) 5-10 MG capsule, Take 1 capsule by mouth daily., Disp: 90 capsule, Rfl: 3   aspirin EC 81 MG tablet, Take 81 mg by mouth daily. Swallow whole., Disp: , Rfl:    atorvastatin (LIPITOR) 20 MG tablet, Take 1 tablet (20 mg total) by mouth daily., Disp: 90 tablet, Rfl: 2   azelastine (ASTELIN) 0.1 % nasal spray, Place 2 sprays into both nostrils 2 (two) times daily., Disp: 54 mL, Rfl: 0   celecoxib (CELEBREX) 200 MG capsule, Take 1 capsule  (200 mg total) by mouth 2 (two) times daily., Disp: 60 capsule, Rfl: 0   citalopram (CELEXA) 10 MG tablet, Take 1 tablet (10 mg total) by mouth daily., Disp: 90 tablet, Rfl: 1   colchicine 0.6 MG tablet, Take 0.5 tablets (0.3 mg total) by mouth daily as needed (gout or psuedogout pain)., Disp: 30 tablet, Rfl: 2   conjugated estrogens (PREMARIN) vaginal cream, Place 1 Applicatorful vaginally once a week., Disp: 42.5 g, Rfl: 12   estrogens, conjugated, (PREMARIN) 0.3 MG tablet, Take 0.3 mg by mouth every Monday, Wednesday, and Friday. , Disp: , Rfl:    hydrochlorothiazide (HYDRODIURIL) 25 MG tablet, Take 0.5 tablets (12.5 mg total) by mouth daily., Disp: 90 tablet, Rfl: 1   HYDROcodone-acetaminophen (NORCO) 5-325 MG tablet, Take 1 tablet by mouth every 6 (six) hours as needed for moderate pain., Disp: 30 tablet, Rfl: 0   levothyroxine (SYNTHROID) 50 MCG tablet, Take 1 tablet (50 mcg total) by mouth daily., Disp: 90 tablet, Rfl: 3   lipase/protease/amylase (CREON) 36000 UNITS CPEP capsule, Take 2 capsules (72,000 Units total) by mouth 3 (three) times daily with meals. May also take 1 capsule (36,000 Units total) as needed (with snacks)., Disp: 240 capsule, Rfl: 11   zolpidem (AMBIEN CR) 12.5 MG CR tablet, TAKE 1 TABLET(12.5 MG) BY MOUTH AT BEDTIME AS NEEDED FOR SLEEP, Disp: 30 tablet, Rfl: 5   Objective:  There were no vitals filed for this visit.    There is no height or weight on file to calculate BMI.    Physical Exam:    ***   Electronically signed by:  Benito Mccreedy D.[facility] 12:04 PM 07/27/21

## 2021-07-28 ENCOUNTER — Ambulatory Visit (INDEPENDENT_AMBULATORY_CARE_PROVIDER_SITE_OTHER): Payer: Medicare Other | Admitting: Sports Medicine

## 2021-07-28 VITALS — BP 104/80 | HR 80 | Ht 64.0 in | Wt 136.0 lb

## 2021-07-28 DIAGNOSIS — F40240 Claustrophobia: Secondary | ICD-10-CM

## 2021-07-28 DIAGNOSIS — M11262 Other chondrocalcinosis, left knee: Secondary | ICD-10-CM | POA: Diagnosis not present

## 2021-07-28 DIAGNOSIS — M25562 Pain in left knee: Secondary | ICD-10-CM | POA: Diagnosis not present

## 2021-07-28 DIAGNOSIS — G8929 Other chronic pain: Secondary | ICD-10-CM | POA: Diagnosis not present

## 2021-07-28 MED ORDER — MELOXICAM 7.5 MG PO TABS: 7.5000 mg | ORAL_TABLET | Freq: Every day | ORAL | 0 refills | Status: DC

## 2021-07-28 MED ORDER — LORAZEPAM 0.5 MG PO TABS: ORAL_TABLET | ORAL | 0 refills | Status: DC

## 2021-07-28 NOTE — Patient Instructions (Addendum)
Good to see you  MRI referral for left knee - Start meloxicam 7.5 mg daily x2 weeks.  If still having pain after 2 weeks, complete 3rd-week of meloxicam. May use remaining meloxicam as needed once daily for pain control.  Do not to use additional NSAIDs while taking meloxicam.  May use Tylenol [phone] mg 2 to 3 times a day for breakthrough pain. Follow up 3 days after MRI to discuss results

## 2021-08-03 ENCOUNTER — Ambulatory Visit (INDEPENDENT_AMBULATORY_CARE_PROVIDER_SITE_OTHER): Payer: Medicare Other

## 2021-08-03 DIAGNOSIS — M25562 Pain in left knee: Secondary | ICD-10-CM | POA: Diagnosis not present

## 2021-08-03 DIAGNOSIS — M11262 Other chondrocalcinosis, left knee: Secondary | ICD-10-CM

## 2021-08-03 DIAGNOSIS — G8929 Other chronic pain: Secondary | ICD-10-CM

## 2021-08-04 ENCOUNTER — Telehealth: Payer: Self-pay | Admitting: Sports Medicine

## 2021-08-05 ENCOUNTER — Other Ambulatory Visit: Payer: Self-pay | Admitting: Sports Medicine

## 2021-08-05 DIAGNOSIS — G8929 Other chronic pain: Secondary | ICD-10-CM

## 2021-08-05 NOTE — Telephone Encounter (Signed)
Pt was called and wanted to be referred for surgery so a referral was sent to [facility] was sent

## 2021-08-13 ENCOUNTER — Ambulatory Visit: Payer: Medicare Other | Admitting: Sports Medicine

## 2021-08-26 ENCOUNTER — Encounter: Payer: Self-pay | Admitting: Family Medicine

## 2021-09-10 ENCOUNTER — Ambulatory Visit: Payer: Self-pay

## 2021-09-10 NOTE — Patient Outreach (Signed)
  Care Coordination   09/10/2021 Name: Linda Knight MRN: [id number] DOB: 06-01-1940   Care Coordination Outreach Attempts:  An unsuccessful telephone outreach was attempted today to offer the patient information about available care coordination services as a benefit of their health plan.   Follow Up Plan:  Additional outreach attempts will be made to offer the patient care coordination information and services.   Encounter Outcome:  No Answer  Care Coordination Interventions Activated:  No   Care Coordination Interventions:  No, not indicated    Daneen Schick, BSW, CDP Social Worker, Certified Dementia Practitioner Care Coordination [phone]

## 2021-09-24 ENCOUNTER — Other Ambulatory Visit: Payer: Self-pay | Admitting: Family Medicine

## 2021-09-24 NOTE — Telephone Encounter (Signed)
..   Encourage patient to contact the pharmacy for refills or they can request refills through [facility]:  06/15/21  NEXT APPOINTMENT DATE:  01/05/22  MEDICATION: zolpidem (AMBIEN CR) 12.5 MG CR tablet   Is the patient out of medication? Yes  PHARMACY: [facility] [phone] - [place], [address]  [address], [place] [address]  Phone:  [phone]  Fax:  [phone]  DEA #:  [id number]  Patient states: - She will need medication as soon as possible especially following knee surgery.

## 2021-09-25 NOTE — Telephone Encounter (Signed)
Patient states [facility] does not have Rx for zolpidem (AMBIEN CR) 12.5 MG CR tablet.  Patient states she has been out of the above medication since 09/23/21 and that the Pharmacy sent a request for RX 09/24/21.

## 2021-09-28 MED ORDER — ZOLPIDEM TARTRATE ER 12.5 MG PO TBCR: EXTENDED_RELEASE_TABLET | ORAL | 5 refills | Status: DC

## 2021-09-28 NOTE — Telephone Encounter (Signed)
Patient requests to be called at ph# [phone] when RX for Zolpidem has [facility] [place].

## 2021-09-28 NOTE — Telephone Encounter (Signed)
[facility] has [facility]    States she did not want script to be sent there.  Is [facility] [place] [facility] [address]    Please give patient a call once sent.

## 2021-09-28 NOTE — Addendum Note (Signed)
Addended by: Betti Cruz on: 09/28/2021 12:50 PM   Modules accepted: Orders

## 2021-09-28 NOTE — Telephone Encounter (Signed)
Pt called to check on refills of her rx. She is asking to have a paper prescription written up and she will pick it up. Please advise

## 2021-10-06 ENCOUNTER — Ambulatory Visit: Payer: Medicare Other | Admitting: Gastroenterology

## 2021-10-15 ENCOUNTER — Ambulatory Visit: Payer: Self-pay

## 2021-10-15 NOTE — Patient Outreach (Signed)
  Care Coordination   Initial Visit Note   10/15/2021 Name: Shericka Johnstone MRN: [id number] DOB: 19-Nov-1940  Gertie Fey Mayerli Kirst is a 81 y.o. year old female who sees Vivi Barrack, MD for primary care. I spoke with  Dwyane Luo by phone today.  What matters to the patients health and wellness today?  Patient refused to participate and hung up on caller.    Goals Addressed   None     SDOH assessments and interventions completed:  No     Care Coordination Interventions Activated:  No  Care Coordination Interventions:  No, not indicated   Follow up plan: No further intervention required.   Encounter Outcome:  Pt. Refused   Daneen Schick, BSW, CDP Social Worker, Certified Dementia Practitioner Care Coordination [phone]

## 2021-10-28 ENCOUNTER — Other Ambulatory Visit: Payer: Self-pay | Admitting: *Deleted

## 2021-10-28 NOTE — Telephone Encounter (Signed)
Pharmacy requesting refills.

## 2021-10-29 MED ORDER — ZOLPIDEM TARTRATE ER 12.5 MG PO TBCR: EXTENDED_RELEASE_TABLET | ORAL | 5 refills | Status: DC

## 2021-10-29 NOTE — Telephone Encounter (Signed)
Rx sent in.  Algis Greenhouse. Jerline Pain, MD 10/29/2021 11:14 AM

## 2021-11-02 ENCOUNTER — Encounter: Payer: Self-pay | Admitting: *Deleted

## 2021-11-03 ENCOUNTER — Telehealth: Payer: Self-pay

## 2021-11-03 NOTE — Telephone Encounter (Signed)
Spoke with patient & she has been rescheduled to 12/01/21 at 1:30 pm with Dr. Tarri Glenn so that MRI will be complete prior that appointment.

## 2021-11-03 NOTE — Telephone Encounter (Signed)
Orders are already in. Schedulers notified to contact patient.  ----- Message -----  From: Aleatha Borer, LPN  Sent: 3/61/2244  12:17 PM EDT  To: Carl Best, RN  Subject: FW: MRI/MRCP                                   Expected ~ 10/2021. Does pt have a current CMP?  ----- Message -----  From: Aleatha Borer, LPN  Sent: 10/15/5298  12:00 AM EDT  To: April H Pait, Roosvelt Maser, *  Subject: MRI/MRCP                                       NOT NEEDED UNTIL ON OR AFTER 11/03/21   RADIOLOGY SCHEDULING REQUEST   [facility]  Phone: [phone]  Fax: [phone]   Imaging Ordered: MRI/MRCP   Diagnosis: Pancreatic lesion   Ordering Provider: Dr. Tarri Glenn   Is a Prior Authorization needed? We are in the process of obtaining it now   Is the patient Diabetic? No   Does the patient have Hypertension? Yes   Does the patient have any implanted devices or hardware? No   Date of last BUN/Creat, if needed? N/A   Patient Weight? 127#   Is the patient able to get on the table? Yes   Has the patient been diagnosed with COVID? No   Is the patient waiting on COVID testing results? No   Thank you for your assistance!  [facility]

## 2021-11-03 NOTE — Telephone Encounter (Signed)
Patient called regarding appt for tomorrow , MRI is schedule for 11/24/21 not sure if she will still have to come  for tomorrow appt.

## 2021-11-04 ENCOUNTER — Ambulatory Visit: Payer: Medicare Other | Admitting: Gastroenterology

## 2021-11-04 ENCOUNTER — Other Ambulatory Visit: Payer: Self-pay | Admitting: Family Medicine

## 2021-11-04 DIAGNOSIS — E039 Hypothyroidism, unspecified: Secondary | ICD-10-CM

## 2021-11-24 ENCOUNTER — Ambulatory Visit (HOSPITAL_COMMUNITY)
Admission: RE | Admit: 2021-11-24 | Discharge: 2021-11-24 | Disposition: A | Discharge status: Home / Self Care | Payer: Medicare Other | Source: Ambulatory Visit | Attending: Gastroenterology | Admitting: Gastroenterology

## 2021-11-24 ENCOUNTER — Other Ambulatory Visit: Payer: Self-pay | Admitting: Gastroenterology

## 2021-11-24 DIAGNOSIS — K8689 Other specified diseases of pancreas: Secondary | ICD-10-CM

## 2021-11-24 DIAGNOSIS — K862 Cyst of pancreas: Secondary | ICD-10-CM | POA: Diagnosis present

## 2021-11-24 MED ORDER — GADOBUTROL 1 MMOL/ML IV SOLN
6.0000 mL | Freq: Once | INTRAVENOUS | Status: AC | PRN
  Administered 2021-11-24: 6 mL via INTRAVENOUS

## 2021-12-01 ENCOUNTER — Encounter: Payer: Self-pay | Admitting: Gastroenterology

## 2021-12-01 ENCOUNTER — Ambulatory Visit (INDEPENDENT_AMBULATORY_CARE_PROVIDER_SITE_OTHER): Payer: Medicare Other | Admitting: Gastroenterology

## 2021-12-01 ENCOUNTER — Other Ambulatory Visit: Payer: Medicare Other

## 2021-12-01 VITALS — BP 110/68 | HR 65 | Ht 64.0 in | Wt 143.2 lb

## 2021-12-01 DIAGNOSIS — R109 Unspecified abdominal pain: Secondary | ICD-10-CM

## 2021-12-01 DIAGNOSIS — K8689 Other specified diseases of pancreas: Secondary | ICD-10-CM

## 2021-12-01 DIAGNOSIS — K862 Cyst of pancreas: Secondary | ICD-10-CM

## 2021-12-01 DIAGNOSIS — R194 Change in bowel habit: Secondary | ICD-10-CM

## 2021-12-01 NOTE — Patient Instructions (Addendum)
I recommend repeating the pancreatic elastase. Please stop by the lab.   Use Metamucil or Citrucel daily, increasing to twice daily after 7 days if no improvement.  Increase magnesium oxide to 500 mg daily. You could increase further to 1000 mg daily if the constipation contiues.  I am glad that you are seeing Dr. Zigmund Daniel tomorrow. I recommend a CT abd/pelvis if your evaluation with her does not explain your symptoms. Please call to schedule the CT. If the CT is negative, we should ask Dr. Jerline Pain if your pain is referred from the back.

## 2021-12-01 NOTE — Progress Notes (Signed)
Referring Provider: Vivi Barrack, MD Primary Care Physician:  Linda Barrack, MD  Chief complaint:  Change in bowel habits, pancreatic cyst on CT   IMPRESSION:  Abdominal Knight - ? Organic GI disease versus radicular Knight Altered bowel habits following colpopexy and bladder repair 10/21    - no improvement with Xifaxan x 14 days Pancreatic cyst and dilated pancreatic duct on recent CT and MRI    - normal pancreas on CT in [facility] in 2019    - Abnormal pancreas on CT 07/05/19    - Abnormal pancreas on MRI 08/10/19    - EGD/EUS 09/27/19:        - small 5-38m pancreatic cysts (body and head). No solid lesions. Nl PD.     - MRI/MRCP 10/30/20: no change    - MRI/MRCP 11/24/21: overall stable Pancreatic elastase low 10/2020, normal 12/2020 Periampullary duodenal diverticulum Abnormal MR lumbar spine 2021 Elevated lipase 07/2019 Recent abdominal Knight, resolved with dietary changes Daily alcohol use, with history of heavier use Glucose intolerance Chronic constipation treated with Miralax and magnesium Adenomyomatosis of the gallbladder fundus. [facility] sm[facility]  Altered bowel habits occurring after surgery: Not explained by colonoscopy, although biopsies showed edema. Will repeat pancreatic elastase, as the results have varied. Continue Creon and daily stool bulking agent in the meantime. There was no change with Xifaxan.   Abnormal pancreas on imaging:  Repeat MRI/MRCP overall stable. Weight has increased 2 pounds. Repeat imaging in 1 year, earlier with new symptoms.    PLAN: - Pancreatic elastase - Continue Creon at current doses - Use Metamucil or Citrucel QD, increasing to BID after 7 days if no improvement - Increase magnesium oxide to 500 mg daily, with further titration to 1000 mg daily - Bladder evaluation with Dr. MZigmund Danieltomorrow - CT abd/pelvis if this does not explain the symptoms - If CT is negative, ? Evaluation by Dr. PJerline Knight for radicular Knight   HPI: JKariah Loredois a 81y.o. female returns in follow-up. She was last seen in the office 11/14/20.   She was initially  referred by Dr. PJerline Painfor further evaluation of abdominal Knight and abnormal pancreas imaging.  Knight resolved with dietary changes to avoid alcohol, dairy, fried foods, greasy foods, spicy foods.   CT scan with and without contrast 07/05/19: pancreatic atrophy with focal distension of the main pancreatic duct to approximately 646m Peripheral pancreatic duct is nondilated. Subtle low attenuation in the head of the pancreas extending towards the atrophied uncinate measuring 33m13mBaseline density not clearly cystic but small.  Mild bilteral ureteral distension without obstructing lesion. Small kidney cysts.   MRI of the abdomen 08/10/19: small lesions in the pancreatic head show ductal communication. Main pancreat duct dilated to just at or above 5mm36mdditional 6mm 78mus in the body of the pancreas. Adenomyomatosis of the gallbladder fundus.  EGD/EUS 09/27/19: gastritis, two small pancreatic cysts (body and head) measuring 5-6mm. 50msolid lesions in the pancreas. Normal pancreatic duct. No LAD. Periampullary duodenal diverticulum.   MRI/MRCP 10/30/20: no change in cluster of small cysts in the pancreatic head the largest of which appears to communicate with the pancreatic duct. There is downstream duct dilation through the pancreatic head and mild upstread duct dilation suspicious for side branch intraductal papillary mucinous tumor. No worrisome features.   Office visit 10/21/20: Reported longstanding history of constipation treated with Metamucil and magnesium PRN. Constipation worsened after sacral colpopexy and  laparoscopic bladder repair 11/2019. Recently called the office with worsening constipation despite increasing fiber and water intake in addition to Miralax QD. But, constipation has since been replaced with loose stools. No associated abdominal  Knight.  Colonoscopy 11/03/20 for altered bowel habits: biopsies show mild edema, otherwise normal. Left-sided diverticulosis. Internal hemorrhoids.   Office follow-up 11/14/20:  Returned in follow-up after her colonoscopy. Uses Benefiber or Miralax when she feels constipation might be coming on. She is finding regulating her bowels very difficulty.  In the last couple of weeks her symptoms may be leveling out.   Creon before meals helps with abdominal Knight and post-prandial gas pains.   MRI/MRCP 11/24/21: Stable 7 mm cystic lesion in the head of the pancreas.  This is not changed compared to 08/10/2019.  Follow-up pancreatic protocol MRI recommended in 2 years.  There is also a stable 2.4 cm cyst in the dome of the liver.  This may be a cystadenoma based on mild internal septation.  There is also a benign left kidney cyst that requires no further work-up.  She has lumbar scoliosis with rotatory component and partial butterfly vertebra T10.  Returns today in follow-up with concerns of constipation and RLQ Knight abdominal Knight. She thinks this may be related to her diet. Described as "child birth" Knight that radiates around to her back. Most intense when she changes positions or after she's been on her feet for an extended period of time. She has an appointment with Dr. Zigmund Knight tomorrow for evaluation of urinary leakage. She awoke at 3am and had an accident prior to getting to the bathroom. She stopped taking Creon because of constipation.  She has previously required steroid injections for sciatica, but it has been several years.   Recent labs: Labs 05/03/19: normal liver enzymes Labs 07/17/19 lipase 111, amylase 72 Labs 08/15/19: normal liver enzymes Labs 10/21/20: normal CMP Labs 10/23/20: pancreatic elastase 42 Labs 12/19/20: pancreatic elastase >500   Past Medical History:  Diagnosis Date   Anemia    Arthritis    CAD (coronary artery disease)    High cholesterol    Hypertension    Pancreatic cyst     SCCA (squamous cell carcinoma) of skin 2019   Right Arm [facility])   Sleep apnea    Squamous cell carcinoma of skin 2019   Left Leg [facility])   Thyroid disease     Past Surgical History:  Procedure Laterality Date   ABDOMINAL HYSTERECTOMY     APPENDECTOMY     BIOPSY  09/27/2019   Procedure: BIOPSY;  Surgeon: Milus Banister, MD;  Location: [facility];  Service: Endoscopy;;   BREAST EXCISIONAL BIOPSY Right    BUNIONECTOMY     carcinoma removed Right 09/2020   CYSTOCELE REPAIR     bladder repair   ESOPHAGOGASTRODUODENOSCOPY (EGD) WITH PROPOFOL N/A 09/27/2019   Procedure: ESOPHAGOGASTRODUODENOSCOPY (EGD) WITH PROPOFOL;  Surgeon: Milus Banister, MD;  Location: [facility];  Service: Endoscopy;  Laterality: N/A;   EUS N/A 09/27/2019   Procedure: UPPER ENDOSCOPIC ULTRASOUND (EUS) RADIAL;  Surgeon: Milus Banister, MD;  Location: [facility];  Service: Endoscopy;  Laterality: N/A;   KNEE ARTHROSCOPY  06/2021   TYMPANOPLASTY Left 06/2020   VAGINAL PROLAPSE REPAIR  12/07/2019    Current Outpatient Medications  Medication Sig Dispense Refill   amLODipine-benazepril (LOTREL) 5-10 MG capsule TAKE 1 CAPSULE DAILY 90 capsule 3   aspirin EC 81 MG tablet Take 81 mg by mouth daily. Swallow whole.  azelastine (ASTELIN) 0.1 % nasal spray Place 2 sprays into both nostrils 2 (two) times daily. 54 mL 0   conjugated estrogens (PREMARIN) vaginal cream Place 1 Applicatorful vaginally once a week. 42.5 g 12   estrogens, conjugated, (PREMARIN) 0.3 MG tablet Take 0.3 mg by mouth every Monday, Wednesday, and Friday.      hydrochlorothiazide (HYDRODIURIL) 25 MG tablet Take 0.5 tablets (12.5 mg total) by mouth daily. 90 tablet 1   levothyroxine (SYNTHROID) 50 MCG tablet TAKE 1 TABLET DAILY 90 tablet 3   zolpidem (AMBIEN CR) 12.5 MG CR tablet TAKE 1 TABLET(12.5 MG) BY MOUTH AT BEDTIME AS NEEDED FOR SLEEP 30 tablet 5   celecoxib (CELEBREX) 200 MG capsule Take 1 capsule (200 mg total) by mouth 2  (two) times daily. (Patient not taking: Reported on 12/01/2021) 60 capsule 0   citalopram (CELEXA) 10 MG tablet TAKE 1 TABLET DAILY (Patient not taking: Reported on 12/01/2021) 90 tablet 3   colchicine 0.6 MG tablet Take 0.5 tablets (0.3 mg total) by mouth daily as needed (gout or psuedogout Knight). (Patient not taking: Reported on 12/01/2021) 30 tablet 2   HYDROcodone-acetaminophen (NORCO) 5-325 MG tablet Take 1 tablet by mouth every 6 (six) hours as needed for moderate Knight. (Patient not taking: Reported on 12/01/2021) 30 tablet 0   No current facility-administered medications for this visit.    Allergies as of 12/01/2021 - Review Complete 12/01/2021  Allergen Reaction Noted   Tape Rash 09/29/2020    Family History  Problem Relation Age of Onset   Hyperlipidemia Mother    Hypertension Mother    Stroke Mother    Lung disease Father    Heart disease Brother    Colon cancer Neg Hx      Physical Exam: General:   Alert,  well-nourished, pleasant and cooperative in NAD Head:  Normocephalic and atraumatic. Eyes:  Sclera clear, no icterus.   Conjunctiva pink. Abdomen:  Soft, nontender, nondistended, normal bowel sounds Skin:  Intact without significant lesions or rashes. Psych:  Alert and cooperative. Normal mood and affect.  I spent over 30 minutes, including in depth chart review, independent review of results, communicating results with the patient directly, face-to-face time with the patient, coordinating care, and ordering studies and medications as appropriate, and documentation.    Linda Dunsworth L. Tarri Glenn, MD, MPH 12/06/2021, 8:27 PM

## 2021-12-06 ENCOUNTER — Encounter: Payer: Self-pay | Admitting: Gastroenterology

## 2021-12-07 ENCOUNTER — Other Ambulatory Visit: Payer: Self-pay | Admitting: Family Medicine

## 2021-12-07 NOTE — Telephone Encounter (Signed)
Patient states she is only using the following new Preferred Pharmacy:  [facility] located at [place]  Patient requests the following 90 day supply RX[facility] [place]:  levothyroxine (SYNTHROID) 50 MCG tablet   AND   estrogens, conjugated, (PREMARIN) 0.3 MG tablet   AND  hydrochlorothiazide (HYDRODIURIL) 25 MG tablet

## 2021-12-09 ENCOUNTER — Other Ambulatory Visit: Payer: Medicare Other

## 2021-12-09 DIAGNOSIS — R109 Unspecified abdominal pain: Secondary | ICD-10-CM

## 2021-12-09 DIAGNOSIS — K8689 Other specified diseases of pancreas: Secondary | ICD-10-CM

## 2021-12-09 DIAGNOSIS — K862 Cyst of pancreas: Secondary | ICD-10-CM

## 2021-12-09 DIAGNOSIS — R194 Change in bowel habit: Secondary | ICD-10-CM

## 2021-12-10 ENCOUNTER — Other Ambulatory Visit: Payer: Self-pay | Admitting: *Deleted

## 2021-12-10 DIAGNOSIS — E039 Hypothyroidism, unspecified: Secondary | ICD-10-CM

## 2021-12-10 MED ORDER — HYDROCHLOROTHIAZIDE 25 MG PO TABS: 12.5000 mg | ORAL_TABLET | Freq: Every day | ORAL | 1 refills | Status: DC

## 2021-12-10 MED ORDER — ESTROGENS CONJUGATED 0.3 MG PO TABS: 0.3000 mg | ORAL_TABLET | ORAL | 3 refills | Status: DC

## 2021-12-10 MED ORDER — LEVOTHYROXINE SODIUM 50 MCG PO TABS: 50.0000 ug | ORAL_TABLET | Freq: Every day | ORAL | 3 refills | Status: DC

## 2021-12-10 NOTE — Telephone Encounter (Signed)
Rx Premarin 0.'3mg'$  last refill by historical provider

## 2021-12-10 NOTE — Addendum Note (Signed)
Addended by: Betti Cruz on: 12/10/2021 04:35 PM   Modules accepted: Orders

## 2021-12-15 LAB — PANCREATIC ELASTASE, FECAL: Pancreatic Elastase-1, Stool: >500 mcg/g

## 2021-12-23 NOTE — Progress Notes (Signed)
Error

## 2021-12-24 ENCOUNTER — Ambulatory Visit (INDEPENDENT_AMBULATORY_CARE_PROVIDER_SITE_OTHER): Payer: Medicare Other | Admitting: Family Medicine

## 2021-12-24 ENCOUNTER — Ambulatory Visit: Payer: Medicare Other | Admitting: Family Medicine

## 2021-12-24 VITALS — BP 129/83 | HR 74 | Temp 98.4°F | Ht 64.0 in | Wt 143.0 lb

## 2021-12-24 DIAGNOSIS — I10 Essential (primary) hypertension: Secondary | ICD-10-CM

## 2021-12-24 DIAGNOSIS — M7061 Trochanteric bursitis, right hip: Secondary | ICD-10-CM

## 2021-12-24 DIAGNOSIS — M7062 Trochanteric bursitis, left hip: Secondary | ICD-10-CM | POA: Diagnosis not present

## 2021-12-24 DIAGNOSIS — H9202 Otalgia, left ear: Secondary | ICD-10-CM

## 2021-12-24 DIAGNOSIS — M1990 Unspecified osteoarthritis, unspecified site: Secondary | ICD-10-CM

## 2021-12-24 MED ORDER — METHYLPREDNISOLONE ACETATE 80 MG/ML IJ SUSP
80.0000 mg | Freq: Once | INTRAMUSCULAR | Status: AC
  Administered 2021-12-24: 80 mg via INTRAMUSCULAR

## 2021-12-24 MED ORDER — AMOXICILLIN-POT CLAVULANATE 875-125 MG PO TABS: 1.0000 | ORAL_TABLET | Freq: Two times a day (BID) | ORAL | 0 refills | Status: DC

## 2021-12-24 NOTE — Assessment & Plan Note (Signed)
Sciatic-like pain in her left knee.  She did not have much benefit with Celebrex in the past.  She will need to follow back up with sports medicine or orthopedics if this continues to be an issue.

## 2021-12-24 NOTE — Patient Instructions (Addendum)
It was very nice to see you today!  You have an ear infection.  Please start the Augmentin.  Let me know if not improving.  We did a steroid injection in your inflamed bursas today.  Please use ice to the area.  Please work on the exercises.  Let me know if not improving.  Take care, Dr Jerline Pain  PLEASE NOTE:  If you had any lab tests please let us know if you have not heard back within a few days. You may see your results on mychart before we have a chance to review them but we will give you a call once they are reviewed by [place]. If we ordered any referrals today, please let us know if you have not heard from their office within the next week.   Please try these tips to maintain a healthy lifestyle:  Eat at least 3 REAL meals and 1-2 snacks per day.  Aim for no more than 5 hours between eating.  If you eat breakfast, please do so within one hour of getting up.   Each meal should contain half fruits/vegetables, one quarter protein, and one quarter carbs (no bigger than a computer mouse)  Cut down on sweet beverages. This includes juice, soda, and sweet tea.   Drink at least 1 glass of water with each meal and aim for at least 8 glasses per day  Exercise at least 150 minutes every week.

## 2021-12-24 NOTE — Assessment & Plan Note (Signed)
Blood pressure at goal today.  Continue amlodipine-benazepril 5-10 once daily and HCTZ 12.5 once daily.

## 2021-12-24 NOTE — Addendum Note (Signed)
Addended by: Betti Cruz on: 12/24/2021 08:10 AM   Modules accepted: Orders

## 2021-12-24 NOTE — Progress Notes (Signed)
   Linda Knight is a 81 y.o. female who presents today for an office visit.  Assessment/Plan:  New/Acute Problems: Otalgia Consistent with mild otitis media.  May have some underlying sinusitis as well.  We will start Augmentin.  She can continue using over-the-counter meds as needed for pain control.  If this continues to be an issue will need to be referred back to ENT  Bilateral Hip Pain Consistent with greater trochanteric bursitis.  Steroid injection performed today bilaterally.  See below procedure note.  She tolerated well.  She can use the leftover Celebrex as needed.  Discussed on exercise program.  Handout was given.  We discussed reasons return to care.  Follow-up as needed.  Chronic Problems Addressed Today: Osteoarthritis Sciatic-like pain in her left knee.  She did not have much benefit with Celebrex in the past.  She will need to follow back up with sports medicine or orthopedics if this continues to be an issue.  Essential hypertension Blood pressure at goal today.  Continue amlodipine-benazepril 5-10 once daily and HCTZ 12.5 once daily.     Subjective:  HPI:  Patient here with left ear pain. Started a few days ago since walking out in the cold air. She does feel like she has had some internal drainage. She has not had any water in her ear. Pain has constant. No fevers or chills.    She has also been having some pain in her right hip. This has been progressive the last several weeks. Located on the lateral aspect of her right.  Consistent with prior episodes of bursitis.  She has had steroid injections in the past which is worked well.       Objective:  Physical Exam: BP 129/83   Pulse 74   Temp 98.4 F (36.9 C) (Temporal)   Ht '5\' 4"'$  (1.626 m)   Wt 143 lb (64.9 kg)   SpO2 99%   BMI 24.55 kg/m   Gen: No acute distress, resting comfortably HEENT: Right TM clear.  Left TM erythematous with perforation.  No significant drainage noted.   CV: Regular rate  and rhythm with no murmurs appreciated Pulm: Normal work of breathing, clear to auscultation bilaterally with no crackles, wheezes, or rhonchi MSK: Right greater trochanter with tenderness upon palpation. Neuro: Grossly normal, moves all extremities Psych: Normal affect and thought content  Bursa  Injection Procedure Note  Pre-operative Diagnosis:  bilateral Trochanteric bursitis  Post-operative Diagnosis: same  Indications: Diagnosis and treatment of symptomatic bursal effusion  Procedure Details   After a discussion of the risks and benefits with the patient (including the possibility that any manipulation of the bursa could introduce infection, worsening the current situation significantly), verbal consent was obtained for the procedure. The left lateral hip was prepped with topical betadine.  Topical ethyl chloride was applied for anesthesia.  A 3-1 mixture of 1% lidocaine without epi and 80 mg/cc of Depo-Medrol were then injected to the point of maximal tenderness.  Needle was withdrawn.  The injection site was cleansed with topical isopropyl alcohol and a dressing was applied.  The procedure above was then repeated for the right lateral hip.   Complications:  None; patient tolerated the procedure well.       Algis Greenhouse. Jerline Pain, MD 12/24/2021 7:57 AM

## 2022-01-05 ENCOUNTER — Ambulatory Visit (INDEPENDENT_AMBULATORY_CARE_PROVIDER_SITE_OTHER): Payer: Medicare Other

## 2022-01-05 VITALS — BP 124/68 | HR 85 | Temp 97.7°F | Wt 139.4 lb

## 2022-01-05 DIAGNOSIS — Z0000 Encounter for general adult medical examination without abnormal findings: Secondary | ICD-10-CM | POA: Diagnosis not present

## 2022-01-05 NOTE — Patient Instructions (Signed)
[name] , Thank you for taking time to come for your Medicare Wellness Visit. I appreciate your ongoing commitment to your health goals. Please review the following plan we discussed and let me know if I can assist you in the future.   These are the goals we discussed:  Goals      Patient Stated     None at this time        This is a list of the screening recommended for you and due dates:  Health Maintenance  Topic Date Due   COVID-19 Vaccine (6 - 2023-24 season) 01/09/2022*   Medicare Annual Wellness Visit  01/06/2023   Pneumonia Vaccine  Completed   Flu Shot  Completed   DEXA scan (bone density measurement)  Completed   Hepatitis C Screening: USPSTF Recommendation to screen - Ages 65-79 yo.  Completed   HPV Vaccine  Aged Out   Zoster (Shingles) Vaccine  Discontinued  *Topic was postponed. The date shown is not the original due date.    Advanced directives: Please bring a copy of your health care power of attorney and living will to the office at your convenience.  Conditions/risks identified: stay healthy   Next appointment: Follow up in one year for your annual wellness visit    Preventive Care 65 Years and Older, Female Preventive care refers to lifestyle choices and visits with your health care provider that can promote health and wellness. What does preventive care include? A yearly physical exam. This is also called an annual well check. Dental exams once or twice a year. Routine eye exams. Ask your health care provider how often you should have your eyes checked. Personal lifestyle choices, including: Daily care of your teeth and gums. Regular physical activity. Eating a healthy diet. Avoiding tobacco and drug use. Limiting alcohol use. Practicing safe sex. Taking low-dose aspirin every day. Taking vitamin and mineral supplements as recommended by your health care provider. What happens during an annual well check? The services and screenings done by  your health care provider during your annual well check will depend on your age, overall health, lifestyle risk factors, and family history of disease. Counseling  Your health care provider may ask you questions about your: Alcohol use. Tobacco use. Drug use. Emotional well-being. Home and relationship well-being. Sexual activity. Eating habits. History of falls. Memory and ability to understand (cognition). Work and work Statistician. Reproductive health. Screening  You may have the following tests or measurements: Height, weight, and BMI. Blood pressure. Lipid and cholesterol levels. These may be checked every 5 years, or more frequently if you are over 22 years old. Skin check. Lung cancer screening. You may have this screening every year starting at age 1 if you have a 30-pack-year history of smoking and currently smoke or have quit within the past 15 years. Fecal occult blood test (FOBT) of the stool. You may have this test every year starting at age 87. Flexible sigmoidoscopy or colonoscopy. You may have a sigmoidoscopy every 5 years or a colonoscopy every 10 years starting at age 39. Hepatitis C blood test. Hepatitis B blood test. Sexually transmitted disease (STD) testing. Diabetes screening. This is done by checking your blood sugar (glucose) after you have not eaten for a while (fasting). You may have this done every 1-3 years. Bone density scan. This is done to screen for osteoporosis. You may have this done starting at age 28. Mammogram. This may be done every 1-2 years. Talk to your  health care provider about how often you should have regular mammograms. Talk with your health care provider about your test results, treatment options, and if necessary, the need for more tests. Vaccines  Your health care provider may recommend certain vaccines, such as: Influenza vaccine. This is recommended every year. Tetanus, diphtheria, and acellular pertussis (Tdap, Td) vaccine. You  may need a Td booster every 10 years. Zoster vaccine. You may need this after age 65. Pneumococcal 13-valent conjugate (PCV13) vaccine. One dose is recommended after age 75. Pneumococcal polysaccharide (PPSV23) vaccine. One dose is recommended after age 72. Talk to your health care provider about which screenings and vaccines you need and how often you need them. This information is not intended to replace advice given to you by your health care provider. Make sure you discuss any questions you have with your health care provider. Document Released: 02/21/2015 Document Revised: 10/15/2015 Document Reviewed: 11/26/2014 Elsevier Interactive Patient Education  2017 [employer] Prevention in the Home Falls can cause injuries. They can happen to people of all ages. There are many things you can do to make your home safe and to help prevent falls. What can I do on the outside of my home? Regularly fix the edges of walkways and driveways and fix any cracks. Remove anything that might make you trip as you walk through a door, such as a raised step or threshold. Trim any bushes or trees on the path to your home. Use bright outdoor lighting. Clear any walking paths of anything that might make someone trip, such as rocks or tools. Regularly check to see if handrails are loose or broken. Make sure that both sides of any steps have handrails. Any raised decks and porches should have guardrails on the edges. Have any leaves, snow, or ice cleared regularly. Use sand or salt on walking paths during winter. Clean up any spills in your garage right away. This includes oil or grease spills. What can I do in the bathroom? Use night lights. Install grab bars by the toilet and in the tub and shower. Do not use towel bars as grab bars. Use non-skid mats or decals in the tub or shower. If you need to sit down in the shower, use a plastic, non-slip stool. Keep the floor dry. Clean up any water that spills  on the floor as soon as it happens. Remove soap buildup in the tub or shower regularly. Attach bath mats securely with double-sided non-slip rug tape. Do not have throw rugs and other things on the floor that can make you trip. What can I do in the bedroom? Use night lights. Make sure that you have a light by your bed that is easy to reach. Do not use any sheets or blankets that are too big for your bed. They should not hang down onto the floor. Have a firm chair that has side arms. You can use this for support while you get dressed. Do not have throw rugs and other things on the floor that can make you trip. What can I do in the kitchen? Clean up any spills right away. Avoid walking on wet floors. Keep items that you use a lot in easy-to-reach places. If you need to reach something above you, use a strong step stool that has a grab bar. Keep electrical cords out of the way. Do not use floor polish or wax that makes floors slippery. If you must use wax, use non-skid floor wax. Do not have  throw rugs and other things on the floor that can make you trip. What can I do with my stairs? Do not leave any items on the stairs. Make sure that there are handrails on both sides of the stairs and use them. Fix handrails that are broken or loose. Make sure that handrails are as long as the stairways. Check any carpeting to make sure that it is firmly attached to the stairs. Fix any carpet that is loose or worn. Avoid having throw rugs at the top or bottom of the stairs. If you do have throw rugs, attach them to the floor with carpet tape. Make sure that you have a light switch at the top of the stairs and the bottom of the stairs. If you do not have them, ask someone to add them for you. What else can I do to help prevent falls? Wear shoes that: Do not have high heels. Have rubber bottoms. Are comfortable and fit you well. Are closed at the toe. Do not wear sandals. If you use a stepladder: Make  sure that it is fully opened. Do not climb a closed stepladder. Make sure that both sides of the stepladder are locked into place. Ask someone to hold it for you, if possible. Clearly mark and make sure that you can see: Any grab bars or handrails. First and last steps. Where the edge of each step is. Use tools that help you move around (mobility aids) if they are needed. These include: Canes. Walkers. Scooters. Crutches. Turn on the lights when you go into a dark area. Replace any light bulbs as soon as they burn out. Set up your furniture so you have a clear path. Avoid moving your furniture around. If any of your floors are uneven, fix them. If there are any pets around you, be aware of where they are. Review your medicines with your doctor. Some medicines can make you feel dizzy. This can increase your chance of falling. Ask your doctor what other things that you can do to help prevent falls. This information is not intended to replace advice given to you by your health care provider. Make sure you discuss any questions you have with your health care provider. Document Released: 11/21/2008 Document Revised: 07/03/2015 Document Reviewed: 03/01/2014 Elsevier Interactive Patient Education  2017 [employer].

## 2022-01-05 NOTE — Progress Notes (Signed)
Subjective:   Linda Knight is a 81 y.o. female who presents for Medicare Annual (Subsequent) preventive examination.  Review of Systems     Cardiac Risk Factors include: advanced age (>35mn, >>58women);hypertension;dyslipidemia     Objective:    Today's Vitals   01/05/22 1027 01/05/22 1028  BP: 124/68   Pulse: 85   Temp: 97.7 F (36.5 C)   SpO2: 99%   Weight: 139 lb 6.4 oz (63.2 kg)   PainSc:  8    Body mass index is 23.93 kg/m.     01/05/2022   10:33 AM 06/02/2021    3:28 PM 12/23/2020   10:27 AM 09/27/2019    9:02 AM 05/22/2019    2:25 PM 09/03/2018   11:50 AM  Advanced Directives  Does Patient Have a Medical Advance Directive? Yes Yes Yes Yes Yes No  Type of [place]amed[place]of [place] will [place] will [place] will H[place]the[place]Living will Living will;Healthcare Power of Attorney   Does patient want to make changes to medical advance directive?  No - Patient declined   No - Patient declined   Copy of [place] Chart? No - copy requested  No - copy requested No - copy requested No - copy requested   Would patient like information on creating a medical advance directive?      No - Patient declined    Current Medications (verified) Outpatient Encounter Medications as of 01/05/2022  Medication Sig   amLODipine-benazepril (LOTREL) 5-10 MG capsule TAKE 1 CAPSULE DAILY   aspirin EC 81 MG tablet Take 81 mg by mouth daily. Swallow whole.   celecoxib (CELEBREX) 200 MG capsule Take 1 capsule (200 mg total) by mouth 2 (two) times daily.   citalopram (CELEXA) 10 MG tablet TAKE 1 TABLET DAILY   colchicine 0.6 MG tablet Take 0.5 tablets (0.3 mg total) by mouth daily as needed (gout or psuedogout pain).   conjugated estrogens (PREMARIN) vaginal cream Place 1 Applicatorful vaginally once a week.   Cyanocobalamin (VITAMIN B 12 PO) Take by mouth.    estrogens, conjugated, (PREMARIN) 0.3 MG tablet Take 1 tablet (0.3 mg total) by mouth every Monday, Wednesday, and Friday.   hydrochlorothiazide (HYDRODIURIL) 25 MG tablet Take 0.5 tablets (12.5 mg total) by mouth daily.   levothyroxine (SYNTHROID) 50 MCG tablet Take 1 tablet (50 mcg total) by mouth daily.   MAGNESIUM CITRATE PO Take by mouth.   POTASSIUM CHLORIDE PO Take by mouth.   zolpidem (AMBIEN CR) 12.5 MG CR tablet TAKE 1 TABLET(12.5 MG) BY MOUTH AT BEDTIME AS NEEDED FOR SLEEP   amoxicillin-clavulanate (AUGMENTIN) 875-125 MG tablet Take 1 tablet by mouth 2 (two) times daily. (Patient not taking: Reported on 01/05/2022)   azelastine (ASTELIN) 0.1 % nasal spray Place 2 sprays into both nostrils 2 (two) times daily.   [DISCONTINUED] HYDROcodone-acetaminophen (NORCO) 5-325 MG tablet Take 1 tablet by mouth every 6 (six) hours as needed for moderate pain.   No facility-administered encounter medications on file as of 01/05/2022.    Allergies (verified) Tape   History: Past Medical History:  Diagnosis Date   Anemia    Arthritis    CAD (coronary artery disease)    High cholesterol    Hypertension    Pancreatic cyst    SCCA (squamous cell carcinoma) of skin 2019   Right Arm ([facility]   Sleep apnea    Squamous cell carcinoma of  skin 2019   Left Leg [facility])   Thyroid disease    Past Surgical History:  Procedure Laterality Date   ABDOMINAL HYSTERECTOMY     APPENDECTOMY     BIOPSY  09/27/2019   Procedure: BIOPSY;  Surgeon: Milus Banister, MD;  Location: [facility];  Service: Endoscopy;;   BREAST EXCISIONAL BIOPSY Right    BUNIONECTOMY     carcinoma removed Right 09/2020   CYSTOCELE REPAIR     bladder repair   ESOPHAGOGASTRODUODENOSCOPY (EGD) WITH PROPOFOL N/A 09/27/2019   Procedure: ESOPHAGOGASTRODUODENOSCOPY (EGD) WITH PROPOFOL;  Surgeon: Milus Banister, MD;  Location: [facility];  Service: Endoscopy;  Laterality: N/A;   EUS N/A 09/27/2019   Procedure: UPPER  ENDOSCOPIC ULTRASOUND (EUS) RADIAL;  Surgeon: Milus Banister, MD;  Location: [facility];  Service: Endoscopy;  Laterality: N/A;   KNEE ARTHROSCOPY  06/2021   TYMPANOPLASTY Left 06/2020   VAGINAL PROLAPSE REPAIR  12/07/2019   Family History  Problem Relation Age of Onset   Hyperlipidemia Mother    Hypertension Mother    Stroke Mother    Lung disease Father    Heart disease Brother    Colon cancer Neg Hx    Social History   Socioeconomic History   Marital status: Married    Spouse name: Not on file   Number of children: 3   Years of education: Not on file   Highest education level: Not on file  Occupational History   Occupation: Retired   Tobacco Use   Smoking status: Former    Types: Cigarettes    Quit date: 09/18/1980    Years since quitting: 41.3   Smokeless tobacco: Never  Vaping Use   Vaping Use: Never used  Substance and Sexual Activity   Alcohol use: Not Currently   Drug use: Never   Sexual activity: Not Currently    Comment: 1st intercourse 81 yo-Fewer than 5 partners  Other Topics Concern   Not on file  Social History Narrative   Moved from [place] July 2020   Social Determinants of Health   Financial Resource Strain: Low Risk  (01/05/2022)   Overall Financial Resource Strain (CARDIA)    Difficulty of Paying Living Expenses: Not hard at all  Food Insecurity: No Food Insecurity (01/05/2022)   Hunger Vital Sign    Worried About Running Out of Food in the Last Year: Never true    [place] in the Last Year: Never true  Transportation Needs: No Transportation Needs (01/05/2022)   PRAPARE - Hydrologist (Medical): No    Lack of Transportation (Non-Medical): No  Physical Activity: Insufficiently Active (01/05/2022)   Exercise Vital Sign    Days of Exercise per Week: 3 days    Minutes of Exercise per Session: 30 min  Stress: No Stress Concern Present (01/05/2022)   [place]    Feeling of Stress : Not at all  Social Connections: Moderately Integrated (01/05/2022)   Social Connection and Isolation Panel [NHANES]    Frequency of Communication with Friends and Family: Twice a week    Frequency of Social Gatherings with Friends and Family: Twice a week    Attends Religious Services: More than 4 times per year    Active Member of [employer] or Organizations: No    Attends Archivist Meetings: Never    Marital Status: Married    Tobacco Counseling Counseling given: Not Answered  Clinical Intake:  Pre-visit preparation completed: Yes  Pain : 0-10 Pain Score: 8  Pain Type: Acute pain Pain Location: Back (and left leg) Pain Orientation: Right Pain Descriptors / Indicators: Cramping Pain Onset: In the past 7 days Pain Frequency: Intermittent Pain Relieving Factors: taking tylenol  Pain Relieving Factors: taking tylenol  BMI - recorded: 23.93 Nutritional Status: BMI of 19-24  Normal Nutritional Risks: None Diabetes: No  How often do you need to have someone help you when you read instructions, pamphlets, or other written materials from your doctor or pharmacy?: 1 - Never  Diabetic?no  Interpreter Needed?: No  Information entered by :: Charlott Rakes, LPN   Activities of Daily Living    01/05/2022   10:34 AM 01/01/2022    2:37 PM  In your present state of health, do you have any difficulty performing the following activities:  Hearing? 0 1  Vision? 0 0  Difficulty concentrating or making decisions? 0 0  Walking or climbing stairs? 0 0  Dressing or bathing? 0 0  Doing errands, shopping? 0 0  Preparing Food and eating ? N N  Using the Toilet? N N  In the past six months, have you accidently leaked urine? Y Y  Comment at times   Do you have problems with loss of bowel control? N N  Managing your Medications? N N  Managing your Finances? N N  Housekeeping or managing your Housekeeping? N Y     Patient Care Team: Vivi Barrack, MD as PCP - General (Family Medicine) Dian Situ, MD as Consulting Physician (Pain Medicine) Lavonna Monarch, MD (Inactive) as Consulting Physician (Dermatology)  Indicate any recent Medical Services you may have received from other than [facility] providers in the past year (date may be approximate).     Assessment:   This is a routine wellness examination for Elfreida.  Hearing/Vision screen Hearing Screening - Comments:: Pt left ear drum loss hearing 80% Vision Screening - Comments:: Pt follows up with dr Katy Fitch for annul eye exams   Dietary issues and exercise activities discussed: Current Exercise Habits: Home exercise routine, Type of exercise: walking, Time (Minutes): 30, Frequency (Times/Week): 3, Weekly Exercise (Minutes/Week): 90   Goals Addressed             This Visit's Progress    Patient Stated       Stay healthy        Depression Screen    01/05/2022   10:35 AM 12/24/2021    7:26 AM 12/23/2020   10:25 AM 12/09/2020   10:52 AM 09/15/2020    2:47 PM 05/22/2019    2:25 PM 11/27/2018    9:28 AM  PHQ 2/9 Scores  PHQ - 2 Score 0 0 0 0 0 0 0  PHQ- 9 Score       2    Fall Risk    01/05/2022   10:34 AM 01/01/2022    2:37 PM 12/24/2021    7:26 AM 12/23/2020   10:27 AM 12/09/2020   10:52 AM  Fall Risk   Falls in the past year? 0 0 0 0 0  Number falls in past yr: 0 0 0 0 0  Injury with Fall? 0 0 0 0 0  Risk for fall due to : Impaired vision  No Fall Risks Impaired vision No Fall Risks  Follow up Falls prevention discussed   Falls prevention discussed     FALL RISK PREVENTION PERTAINING TO THE HOME:  Any stairs in or  around the home? Yes  If so, are there any without handrails? No  Home free of loose throw rugs in walkways, pet beds, electrical cords, etc? Yes  Adequate lighting in your home to reduce risk of falls? Yes   ASSISTIVE DEVICES UTILIZED TO PREVENT FALLS:  Life alert? No  Use of a cane, walker or w/c? No   Grab bars in the bathroom? Yes  Shower chair or bench in shower? Yes  Elevated toilet seat or a handicapped toilet? No   TIMED UP AND GO:  Was the test performed? Yes .  Length of time to ambulate 10 feet: 10 sec.   Gait steady and fast with assistive device  Cognitive Function:        01/05/2022   10:37 AM 12/23/2020   10:30 AM 05/22/2019    2:25 PM  6CIT Screen  What Year? 0 points 0 points 0 points  What month? 0 points 0 points 0 points  What time? 0 points 0 points 0 points  Count back from 20 0 points 0 points 0 points  Months in reverse 0 points 0 points 0 points  Repeat phrase 0 points 0 points 0 points  Total Score 0 points 0 points 0 points    Immunizations Immunization History  Administered Date(s) Administered   Fluad Quad(high Dose 65+) 11/14/2019, 12/09/2020, 11/25/2021   Influenza, High Dose Seasonal PF 11/11/2018, 11/14/2019   PFIZER Comirnaty(Gray Top)Covid-19 Tri-Sucrose Vaccine 03/25/2019, 04/17/2019   PFIZER(Purple Top)SARS-COV-2 Vaccination 03/25/2019, 04/17/2019, 12/22/2020   PNEUMOCOCCAL CONJUGATE-20 03/11/2021    TDAP status: Due, Education has been provided regarding the importance of this vaccine. Advised may receive this vaccine at local pharmacy or Health Dept. Aware to provide a copy of the vaccination record if obtained from local pharmacy or Health Dept. Verbalized acceptance and understanding.  Flu Vaccine status: Up to date  Pneumococcal vaccine status: Up to date  Covid-19 vaccine status: Completed vaccines  Qualifies for Shingles Vaccine? Yes   Zostavax completed No   Shingrix Completed?: No.    Education has been provided regarding the importance of this vaccine. Patient has been advised to call insurance company to determine out of pocket expense if they have not yet received this vaccine. Advised may also receive vaccine at local pharmacy or Health Dept. Verbalized acceptance and understanding.  Screening Tests Health  Maintenance  Topic Date Due   COVID-19 Vaccine (6 - 2023-24 season) 01/09/2022 (Originally 10/09/2021)   Medicare Annual Wellness (AWV)  01/06/2023   Pneumonia Vaccine 65+ Years old  Completed   INFLUENZA VACCINE  Completed   DEXA SCAN  Completed   Hepatitis C Screening  Completed   HPV VACCINES  Aged Out   Zoster Vaccines- Shingrix  Discontinued    Health Maintenance  There are no preventive care reminders to display for this patient.   Colorectal cancer screening: No longer required.   Mammogram status: Completed 03/03/21. Repeat every year  Bone Density status: Completed 10/14/17. Results reflect: Bone density results: OSTEOPENIA. Repeat every 2 years.   Additional Screening:  Hepatitis C Screening:  Completed 03/11/21  Vision Screening: Recommended annual ophthalmology exams for early detection of glaucoma and other disorders of the eye. Is the patient up to date with their annual eye exam?  Yes  Who is the provider or what is the name of the office in which the patient attends annual eye exams? Dr Katy Fitch  If pt is not established with a provider, would they like to be referred to a  provider to establish care? No .   Dental Screening: Recommended annual dental exams for proper oral hygiene  Community Resource Referral / Chronic Care Management: CRR required this visit?  No   CCM required this visit?  No      Plan:     I have personally reviewed and noted the following in the patient's chart:   Medical and social history Use of alcohol, tobacco or illicit drugs  Current medications and supplements including opioid prescriptions. Patient is not currently taking opioid prescriptions. Functional ability and status Nutritional status Physical activity Advanced directives List of other physicians Hospitalizations, surgeries, and ER visits in previous 12 months Vitals Screenings to include cognitive, depression, and falls Referrals and appointments  In addition, I  have reviewed and discussed with patient certain preventive protocols, quality metrics, and best practice recommendations. A written personalized care plan for preventive services as well as general preventive health recommendations were provided to patient.     Willette Brace, LPN   [id number]   Nurse Notes: none

## 2022-01-07 ENCOUNTER — Encounter: Payer: Self-pay | Admitting: Family Medicine

## 2022-01-07 ENCOUNTER — Ambulatory Visit (INDEPENDENT_AMBULATORY_CARE_PROVIDER_SITE_OTHER): Payer: Medicare Other | Admitting: Family Medicine

## 2022-01-07 VITALS — BP 138/86 | HR 68 | Temp 97.5°F | Ht 64.0 in | Wt 138.8 lb

## 2022-01-07 DIAGNOSIS — H7292 Unspecified perforation of tympanic membrane, left ear: Secondary | ICD-10-CM

## 2022-01-07 DIAGNOSIS — I10 Essential (primary) hypertension: Secondary | ICD-10-CM | POA: Diagnosis not present

## 2022-01-07 MED ORDER — BACLOFEN 10 MG PO TABS: 5.0000 mg | ORAL_TABLET | Freq: Three times a day (TID) | ORAL | 0 refills | Status: DC

## 2022-01-07 MED ORDER — PREDNISONE 50 MG PO TABS: ORAL_TABLET | ORAL | 0 refills | Status: DC

## 2022-01-07 NOTE — Assessment & Plan Note (Signed)
Blood pressure at goal today on amlodipine-benazepril 5-10 once daily and HCTZ 12.5 once daily.
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
## 2022-01-07 NOTE — Patient Instructions (Signed)
It was very nice to see you today!  I think you have a muscle strain.  Please start the prednisone and baclofen.  Let me know if not improving in the next 1 to 2 weeks.  Please call to schedule appointment with ear nose and throat doctor soon.  Take care, Dr Jerline Pain  PLEASE NOTE:  If you had any lab tests please let us know if you have not heard back within a few days. You may see your results on mychart before we have a chance to review them but we will give you a call once they are reviewed by [place]. If we ordered any referrals today, please let us know if you have not heard from their office within the next week.   Please try these tips to maintain a healthy lifestyle:  Eat at least 3 REAL meals and 1-2 snacks per day.  Aim for no more than 5 hours between eating.  If you eat breakfast, please do so within one hour of getting up.   Each meal should contain half fruits/vegetables, one quarter protein, and one quarter carbs (no bigger than a computer mouse)  Cut down on sweet beverages. This includes juice, soda, and sweet tea.   Drink at least 1 glass of water with each meal and aim for at least 8 glasses per day  Exercise at least 150 minutes every week.

## 2022-01-07 NOTE — Assessment & Plan Note (Signed)
Contributing to her left ear pain. Will have her follow back up with ENT.

## 2022-01-19 ENCOUNTER — Encounter: Payer: Self-pay | Admitting: Family Medicine

## 2022-01-19 ENCOUNTER — Ambulatory Visit (INDEPENDENT_AMBULATORY_CARE_PROVIDER_SITE_OTHER): Payer: Medicare Other | Admitting: Family Medicine

## 2022-01-19 VITALS — BP 125/66 | HR 80 | Temp 98.2°F | Ht 64.0 in | Wt 134.4 lb

## 2022-01-19 DIAGNOSIS — E039 Hypothyroidism, unspecified: Secondary | ICD-10-CM

## 2022-01-19 DIAGNOSIS — E538 Deficiency of other specified B group vitamins: Secondary | ICD-10-CM | POA: Diagnosis not present

## 2022-01-19 DIAGNOSIS — K8689 Other specified diseases of pancreas: Secondary | ICD-10-CM

## 2022-01-19 DIAGNOSIS — N951 Menopausal and female climacteric states: Secondary | ICD-10-CM

## 2022-01-19 DIAGNOSIS — I10 Essential (primary) hypertension: Secondary | ICD-10-CM

## 2022-01-19 DIAGNOSIS — H7292 Unspecified perforation of tympanic membrane, left ear: Secondary | ICD-10-CM | POA: Diagnosis not present

## 2022-01-19 DIAGNOSIS — R739 Hyperglycemia, unspecified: Secondary | ICD-10-CM | POA: Diagnosis not present

## 2022-01-19 DIAGNOSIS — E785 Hyperlipidemia, unspecified: Secondary | ICD-10-CM | POA: Diagnosis not present

## 2022-01-19 DIAGNOSIS — M6289 Other specified disorders of muscle: Secondary | ICD-10-CM

## 2022-01-19 DIAGNOSIS — G4700 Insomnia, unspecified: Secondary | ICD-10-CM

## 2022-01-19 LAB — COMPREHENSIVE METABOLIC PANEL
ALT: 19 U/L (ref 0–35)
AST: 21 U/L (ref 0–37)
Albumin: 4.3 g/dL (ref 3.5–5.2)
Alkaline Phosphatase: 75 U/L (ref 39–117)
BUN: 20 mg/dL (ref 6–23)
CO2: 32 mEq/L (ref 19–32)
Calcium: 9.3 mg/dL (ref 8.4–10.5)
Chloride: 91 mEq/L — ABNORMAL LOW (ref 96–112)
Creatinine, Ser: 1.07 mg/dL (ref 0.40–1.20)
GFR: 49.24 mL/min — ABNORMAL LOW (ref >60.00)
Glucose, Bld: 94 mg/dL (ref 70–99)
Potassium: 3 mEq/L — ABNORMAL LOW (ref 3.5–5.1)
Sodium: 132 mEq/L — ABNORMAL LOW (ref 135–145)
Total Bilirubin: 0.6 mg/dL (ref 0.2–1.2)
Total Protein: 7.1 g/dL (ref 6.0–8.3)

## 2022-01-19 LAB — CBC
HCT: 35.3 % — ABNORMAL LOW (ref 36.0–46.0)
Hemoglobin: 12.5 g/dL (ref 12.0–15.0)
MCHC: 35.4 g/dL (ref 30.0–36.0)
MCV: 97 fl (ref 78.0–100.0)
Platelets: 263 10*3/uL (ref 150.0–400.0)
RBC: 3.64 Mil/uL — ABNORMAL LOW (ref 3.87–5.11)
RDW: 12.3 % (ref 11.5–15.5)
WBC: 5 10*3/uL (ref 4.0–10.5)

## 2022-01-19 LAB — LIPID PANEL
Cholesterol: 252 mg/dL — ABNORMAL HIGH (ref 0–200)
HDL: 94.7 mg/dL (ref >39.00)
LDL Cholesterol: 135 mg/dL — ABNORMAL HIGH (ref 0–99)
NonHDL: 157.71
Total CHOL/HDL Ratio: 3
Triglycerides: 112 mg/dL (ref 0.0–149.0)
VLDL: 22.4 mg/dL (ref 0.0–40.0)

## 2022-01-19 LAB — TSH: TSH: 2.57 u[IU]/mL (ref 0.35–5.50)

## 2022-01-19 LAB — VITAMIN B12: Vitamin B-12: 331 pg/mL (ref 211–911)

## 2022-01-19 LAB — HEMOGLOBIN A1C: Hgb A1c MFr Bld: 6 % (ref 4.6–6.5)

## 2022-01-19 NOTE — Assessment & Plan Note (Signed)
Check TSH.  She is on Synthroid 50 mcg daily.

## 2022-01-19 NOTE — Patient Instructions (Signed)
It was very nice to see you today!  We we will check blood work today.  Please continue to work on diet and exercise.  Will see you back in a year for your next annual checkup with labs.  Come back to see Linda Knight sooner if needed.  Take care, Dr Jerline Pain  PLEASE NOTE:  If you had any lab tests please let Linda Knight know if you have not heard back within a few days. You may see your results on mychart before we have a chance to review them but we will give you a call once they are reviewed by Linda Knight. If we ordered any referrals today, please let Linda Knight know if you have not heard from their office within the next week.   Please try these tips to maintain a healthy lifestyle:  Eat at least 3 REAL meals and 1-2 snacks per day.  Aim for no more than 5 hours between eating.  If you eat breakfast, please do so within one hour of getting up.   Each meal should contain half fruits/vegetables, one quarter protein, and one quarter carbs (no bigger than a computer mouse)  Cut down on sweet beverages. This includes juice, soda, and sweet tea.   Drink at least 1 glass of water with each meal and aim for at least 8 glasses per day  Exercise at least 150 minutes every week.

## 2022-01-19 NOTE — Assessment & Plan Note (Addendum)
I spoke with gynecology in the past.  She is complaining of vaginal Premarin once weekly and Premarin by mouth 0.3 mg 3 times weekly.  She has previously followed with gynecology for this.  Does not need refills on medications today.  She is aware of potential long-term side effects.

## 2022-01-19 NOTE — Assessment & Plan Note (Signed)
Still has some pain to the area.  No signs of infection.  She will follow-up with ENT next month.

## 2022-01-19 NOTE — Assessment & Plan Note (Signed)
At goal on current regimen amlodipine-benazepril 5-10 once daily and HCTZ 12.5 once daily.

## 2022-01-19 NOTE — Assessment & Plan Note (Signed)
Stable on Ambien nightly as needed does not need refill today.

## 2022-01-19 NOTE — Assessment & Plan Note (Signed)
Follows with urogyn for this.  Has been recently started on Gemtesa with some improvement in symptoms though still having some issues with diarrhea.

## 2022-01-19 NOTE — Assessment & Plan Note (Signed)
Check B12 

## 2022-01-19 NOTE — Progress Notes (Signed)
   Linda Knight is a 81 y.o. female who presents today for an office visit.  Assessment/Plan:  Chronic Problems Addressed Today: Essential hypertension At goal on current regimen amlodipine-benazepril 5-10 once daily and HCTZ 12.5 once daily.  Dyslipidemia Check lipids. she is on Lipitor 20 mg daily.  Hypothyroidism Check TSH.  She is on Synthroid 50 mcg daily.  Perforation of left tympanic membrane Still has some pain to the area.  No signs of infection.  She will follow-up with ENT next month.  B12 deficiency Check B12.  Pancreatic insufficiency Continue management per GI.  Insomnia Stable on Ambien nightly as needed does not need refill today.  Menopausal symptoms I spoke with gynecology in the past.  She is complaining of vaginal Premarin once weekly and Premarin by mouth 0.3 mg 3 times weekly.  She has previously followed with gynecology for this.  Does not need refills on medications today.  She is aware of potential long-term side effects.  Pelvic floor dysfunction Follows with urogyn for this.  Has been recently started on Gemtesa with some improvement in symptoms though still having some issues with diarrhea.     Subjective:  HPI:  See A/p for status of chronic conditions. No acute concerns today.        Objective:  Physical Exam: BP 125/66   Pulse 80   Temp 98.2 F (36.8 C) (Temporal)   Ht '5\' 4"'$  (1.626 m)   Wt 134 lb 6.4 oz (61 kg)   SpO2 100%   BMI 23.07 kg/m   Wt Readings from Last 3 Encounters:  01/19/22 134 lb 6.4 oz (61 kg)  01/07/22 138 lb 12.8 oz (63 kg)  01/05/22 139 lb 6.4 oz (63.2 kg)    Gen: No acute distress, resting comfortably HEENT: Right TM clear.  Left TM with perforation.  No noticeable drainage. CV: Regular rate and rhythm with no murmurs appreciated Pulm: Normal work of breathing, clear to auscultation bilaterally with no crackles, wheezes, or rhonchi Neuro: Grossly normal, moves all extremities Psych: Normal affect  and thought content      Kainoah Bartosiewicz M. Jerline Pain, MD 01/19/2022 8:48 AM

## 2022-01-19 NOTE — Assessment & Plan Note (Signed)
Continue management per GI.  

## 2022-01-19 NOTE — Assessment & Plan Note (Signed)
Check lipids. she is on Lipitor 20 mg daily.

## 2022-01-22 NOTE — Progress Notes (Signed)
Please inform patient of the following:  Her sodium and potassium are down a bit.  This may be due to her medication.  Please have her come back in 1 to 2 weeks for [place] to recheck.  Her cholesterol is up quite a bit since last time.  Please verify that she has been taking her Lipitor 20 mg daily.  If so we should increase to 40 mg daily.  Blood sugar is up a little bit but do not need to start meds for this.  She should continue to work on diet and exercise and we can recheck in a year.  All of her other labs are stable.

## 2022-02-03 ENCOUNTER — Other Ambulatory Visit: Payer: Self-pay

## 2022-02-03 DIAGNOSIS — K8689 Other specified diseases of pancreas: Secondary | ICD-10-CM

## 2022-02-05 NOTE — Progress Notes (Signed)
Please inform patient of the following:  We can try simvastatin '20mg'$  daily. This is a milder cholesterol medication than the lipitor.  Algis Greenhouse. Jerline Pain, MD 02/05/2022 3:03 PM

## 2022-02-09 ENCOUNTER — Other Ambulatory Visit: Payer: Self-pay | Admitting: *Deleted

## 2022-02-09 MED ORDER — ATORVASTATIN CALCIUM 20 MG PO TABS: 20.0000 mg | ORAL_TABLET | Freq: Every day | ORAL | 2 refills | Status: DC

## 2022-02-10 ENCOUNTER — Telehealth: Payer: Self-pay | Admitting: Family Medicine

## 2022-02-10 ENCOUNTER — Other Ambulatory Visit (INDEPENDENT_AMBULATORY_CARE_PROVIDER_SITE_OTHER): Payer: Medicare Other

## 2022-02-10 ENCOUNTER — Other Ambulatory Visit: Payer: Self-pay | Admitting: *Deleted

## 2022-02-10 DIAGNOSIS — K8689 Other specified diseases of pancreas: Secondary | ICD-10-CM

## 2022-02-10 LAB — COMPREHENSIVE METABOLIC PANEL
ALT: 21 U/L (ref 0–35)
AST: 26 U/L (ref 0–37)
Albumin: 4.1 g/dL (ref 3.5–5.2)
Alkaline Phosphatase: 74 U/L (ref 39–117)
BUN: 14 mg/dL (ref 6–23)
CO2: 31 mEq/L (ref 19–32)
Calcium: 9.3 mg/dL (ref 8.4–10.5)
Chloride: 95 mEq/L — ABNORMAL LOW (ref 96–112)
Creatinine, Ser: 1.01 mg/dL (ref 0.40–1.20)
GFR: 52.75 mL/min — ABNORMAL LOW (ref >60.00)
Glucose, Bld: 91 mg/dL (ref 70–99)
Potassium: 3.3 mEq/L — ABNORMAL LOW (ref 3.5–5.1)
Sodium: 136 mEq/L (ref 135–145)
Total Bilirubin: 0.5 mg/dL (ref 0.2–1.2)
Total Protein: 6.6 g/dL (ref 6.0–8.3)

## 2022-02-10 MED ORDER — ESTROGENS CONJUGATED 0.3 MG PO TABS: 0.3000 mg | ORAL_TABLET | ORAL | 3 refills | Status: DC

## 2022-02-10 NOTE — Telephone Encounter (Signed)
..   Encourage patient to contact the pharmacy for refills or they can request refills through [place]:  Please schedule appointment if longer than 1 year  NEXT APPOINTMENT DATE:  MEDICATION: premarin tab 0.'3mg'$    Is the patient out of medication?  Yes   PHARMACY:[facility] on file   Let patient know to contact pharmacy at the end of the day to make sure medication is ready.  Please notify patient to allow 48-72 hours to process  Yes

## 2022-02-10 NOTE — Telephone Encounter (Signed)
Rx send to [facility]  Patient notified

## 2022-02-11 NOTE — Progress Notes (Signed)
Please inform patient of the following:  Sodium is back to normal and potassium is improving. We can recheck next time she comes in for an office visit. Do not need to do any further testing at this point.

## 2022-03-16 ENCOUNTER — Telehealth: Payer: Self-pay | Admitting: Family Medicine

## 2022-03-16 NOTE — Telephone Encounter (Signed)
Patient states: - Right hip pain is getting worse - She spoke to PCP about this in April 2023 and got cortisone shot; also brought it up in 01/2022 OV   Patient is requesting a referral be placed. I offered patient an OV but she declined unless absolutely necessary.

## 2022-03-17 NOTE — Telephone Encounter (Signed)
Please advise 

## 2022-03-18 NOTE — Telephone Encounter (Signed)
Ok with me. Please place any necessary orders. 

## 2022-03-19 ENCOUNTER — Other Ambulatory Visit: Payer: Self-pay | Admitting: *Deleted

## 2022-03-19 DIAGNOSIS — M25551 Pain in right hip: Secondary | ICD-10-CM

## 2022-03-19 NOTE — Telephone Encounter (Signed)
Referral sports medicine placed

## 2022-03-22 ENCOUNTER — Telehealth: Payer: Self-pay | Admitting: Family Medicine

## 2022-03-22 NOTE — Progress Notes (Unsigned)
   I, Peterson Lombard, LAT, ATC acting as a scribe for Lynne Leader, MD.  Linda Knight is a 82 y.o. female who presents to [place] at [facility] today for c/o bilat hip pain. Pt was previously seen by Dr. Glennon Mac on 07/28/21 for chronic L knee pain. Today,  Pt locates pain to ***  LBP: Radiates:  LE Numbness/tingling: LE Weakness: Aggravates: Treatments tried:  Dx imaging: 07/12/19 L-spine XR  01/08/19 Bilat hips/pelvis  Pertinent review of systems: ***  Relevant historical information: ***   Exam:  There were no vitals taken for this visit. General: Well Developed, well nourished, and in no acute distress.   MSK: ***    Lab and Radiology Results No results found for this or any previous visit (from the past 72 hour(s)). No results found.     Assessment and Plan: 82 y.o. female with ***   PDMP not reviewed this encounter. No orders of the defined types were placed in this encounter.  No orders of the defined types were placed in this encounter.    Discussed warning signs or symptoms. Please see discharge instructions. Patient expresses understanding.   ***

## 2022-03-22 NOTE — Telephone Encounter (Signed)
  Encourage patient to contact the pharmacy for refills or they can request refills through [place]:  Please schedule appointment if longer than 1 year  NEXT APPOINTMENT DATE:  MEDICATION:  amLODipine-benazepril (LOTREL) 5-10 MG capsule   Is the patient out of medication? Yes  [id number] [phone] Fax: [phone]   Let patient know to contact pharmacy at the end of the day to make sure medication is ready.  Please notify patient to allow 48-72 hours to process   PLEASE DO 90DAY SUPPLY

## 2022-03-23 ENCOUNTER — Other Ambulatory Visit: Payer: Self-pay | Admitting: *Deleted

## 2022-03-23 MED ORDER — AMLODIPINE BESY-BENAZEPRIL HCL 5-10 MG PO CAPS: 1.0000 | ORAL_CAPSULE | Freq: Every day | ORAL | 1 refills | Status: DC

## 2022-03-23 NOTE — Telephone Encounter (Signed)
[facility]

## 2022-03-24 ENCOUNTER — Other Ambulatory Visit: Payer: Self-pay

## 2022-03-24 ENCOUNTER — Telehealth: Payer: Self-pay | Admitting: Family Medicine

## 2022-03-24 ENCOUNTER — Ambulatory Visit (INDEPENDENT_AMBULATORY_CARE_PROVIDER_SITE_OTHER): Payer: Medicare Other | Admitting: Family Medicine

## 2022-03-24 ENCOUNTER — Encounter: Payer: Self-pay | Admitting: Family Medicine

## 2022-03-24 ENCOUNTER — Ambulatory Visit (INDEPENDENT_AMBULATORY_CARE_PROVIDER_SITE_OTHER): Payer: Medicare Other

## 2022-03-24 VITALS — BP 148/82 | HR 65 | Ht 64.0 in | Wt 141.0 lb

## 2022-03-24 DIAGNOSIS — M791 Myalgia, unspecified site: Secondary | ICD-10-CM | POA: Diagnosis not present

## 2022-03-24 DIAGNOSIS — M549 Dorsalgia, unspecified: Secondary | ICD-10-CM

## 2022-03-24 DIAGNOSIS — M25551 Pain in right hip: Secondary | ICD-10-CM | POA: Diagnosis not present

## 2022-03-24 DIAGNOSIS — G8929 Other chronic pain: Secondary | ICD-10-CM | POA: Diagnosis not present

## 2022-03-24 DIAGNOSIS — M25552 Pain in left hip: Secondary | ICD-10-CM

## 2022-03-24 LAB — SEDIMENTATION RATE: Sed Rate: 21 mm/hr (ref 0–30)

## 2022-03-24 LAB — CK: Total CK: 88 U/L (ref 7–177)

## 2022-03-24 NOTE — Patient Instructions (Addendum)
Thank you for coming in today.   You received an injection today. Seek immediate medical attention if the joint becomes red, extremely painful, or is oozing fluid.   Please get labs today before you leave   I've referred you to Physical Therapy.  Let us know if you don't hear from them in one week.   Recheck in 6 weeks.

## 2022-03-24 NOTE — Telephone Encounter (Signed)
Last refill: 10/29/21 #30, 5 Last OV: 01/19/22 HTN

## 2022-03-24 NOTE — Telephone Encounter (Signed)
Refill request sent to PCP.

## 2022-03-24 NOTE — Telephone Encounter (Signed)
  Encourage patient to contact the pharmacy for refills or they can request refills through [place]:  Please schedule appointment if longer than 1 year  NEXT APPOINTMENT DATE:  MEDICATION:  zolpidem (AMBIEN CR) 12.5 MG CR tablet   Is the patient out of medication? YES  [id number] [phone] Fax: [phone]   Let patient know to contact pharmacy at the end of the day to make sure medication is ready.  Please notify patient to allow 48-72 hours to process

## 2022-03-25 MED ORDER — ZOLPIDEM TARTRATE ER 12.5 MG PO TBCR: EXTENDED_RELEASE_TABLET | ORAL | 5 refills | Status: DC

## 2022-03-25 NOTE — Progress Notes (Signed)
Labs to evaluate for polymyalgia rheumatica are normal thankfully.  This is great news.

## 2022-03-29 NOTE — Progress Notes (Signed)
Bilateral hip x-ray shows some arthritis changes at the base of the spine and SI joints but not much arthritis in the hip joints.

## 2022-04-13 ENCOUNTER — Ambulatory Visit (INDEPENDENT_AMBULATORY_CARE_PROVIDER_SITE_OTHER): Payer: Medicare Other | Admitting: Physical Therapy

## 2022-04-13 ENCOUNTER — Encounter: Payer: Self-pay | Admitting: Physical Therapy

## 2022-04-13 DIAGNOSIS — M25551 Pain in right hip: Secondary | ICD-10-CM | POA: Diagnosis not present

## 2022-04-13 DIAGNOSIS — M25552 Pain in left hip: Secondary | ICD-10-CM

## 2022-04-13 DIAGNOSIS — G8929 Other chronic pain: Secondary | ICD-10-CM

## 2022-04-13 DIAGNOSIS — M6281 Muscle weakness (generalized): Secondary | ICD-10-CM

## 2022-04-13 DIAGNOSIS — M25562 Pain in left knee: Secondary | ICD-10-CM

## 2022-04-13 NOTE — Therapy (Signed)
OUTPATIENT PHYSICAL THERAPY LOWER EXTREMITY TREATMENT    Patient Name: Marjo Bietz MRN: [id number] DOB:[birth year], 82 y.o., female Today's Date: 04/13/22   PT End of Session - 04/13/22 1244     Visit Number 4    Number of Visits 12    Date for PT Re-Evaluation 07/14/21    Authorization Type medicare/tricare    Progress Note Due on Visit 10    PT Start Time 1100    PT Stop Time 1133    PT Time Calculation (min) 33 min    Activity Tolerance Patient tolerated treatment well    Behavior During Therapy [facility] for tasks assessed/performed              Past Medical History:  Diagnosis Date   Anemia    Arthritis    CAD (coronary artery disease)    High cholesterol    Hypertension    Pancreatic cyst    SCCA (squamous cell carcinoma) of skin 2019   Right Arm [facility])   Sleep apnea    Squamous cell carcinoma of skin 2019   Left Leg [facility])   Thyroid disease    Past Surgical History:  Procedure Laterality Date   ABDOMINAL HYSTERECTOMY     APPENDECTOMY     BIOPSY  09/27/2019   Procedure: BIOPSY;  Surgeon: Milus Banister, MD;  Location: [facility];  Service: Endoscopy;;   BREAST EXCISIONAL BIOPSY Right    BUNIONECTOMY     carcinoma removed Right 09/2020   CYSTOCELE REPAIR     bladder repair   ESOPHAGOGASTRODUODENOSCOPY (EGD) WITH PROPOFOL N/A 09/27/2019   Procedure: ESOPHAGOGASTRODUODENOSCOPY (EGD) WITH PROPOFOL;  Surgeon: Milus Banister, MD;  Location: [facility];  Service: Endoscopy;  Laterality: N/A;   EUS N/A 09/27/2019   Procedure: UPPER ENDOSCOPIC ULTRASOUND (EUS) RADIAL;  Surgeon: Milus Banister, MD;  Location: [facility];  Service: Endoscopy;  Laterality: N/A;   KNEE ARTHROSCOPY  06/2021   TYMPANOPLASTY Left 06/2020   VAGINAL PROLAPSE REPAIR  12/07/2019   Patient Active Problem List   Diagnosis Date Noted   Chondrocalcinosis of left knee 06/17/2021   Dry eyes 05/18/2021   Osteoarthritis 05/18/2021   Pelvic floor dysfunction 03/11/2021    Anxiety 09/15/2020   History of melanoma 11/14/2019   Pancreatic insufficiency 07/17/2019   Stress 07/17/2019   B12 deficiency 05/29/2019   Chronic low back pain with sciatica 11/27/2018   Tinnitus of both ears 09/19/2018   Perforation of left tympanic membrane 09/19/2018   Stenosis of carotid artery 09/19/2018   Female bladder prolapse 09/19/2018   Essential hypertension 09/19/2018   Dyslipidemia 09/19/2018   Hypothyroidism 09/19/2018   Menopausal symptoms 09/19/2018   Insomnia 09/19/2018   Allergic rhinitis 09/19/2018   Osteopenia 09/19/2018    PCP: Vivi Barrack, MD  REFERRING PROVIDER: Gregor Hams, MD  REFERRING DIAG: [phone] (ICD-10-CM) - Left knee pain, unspecified chronicity  THERAPY DIAG:  Pain in left hip  Pain in right hip  Chronic pain of left knee  Muscle weakness (generalized)  ONSET DATE: 2 weeks ago  SUBJECTIVE:   SUBJECTIVE STATEMENT: 04/13/22: Eval: Pt states Pain in both hips, ongoing for about 1 year. She has had 3 rounds of injections, last one very recent, this that it has helped. Still having some soreness   with standing in 1 place, or sititng for too long. Also states today she just twisted to get out of car, and has increased pain in L side of back, and  down L leg(has had in the past )  L leg feels weak to her.  Other: L knee pain, meniscal tear, scope in may 2023. Reports less knee pain since surgery   Other: has had previous back pain in L side low bac/SI, travels down into L thigh and into L foot.   PERTINENT HISTORY: HTN, hx of left bunion surgery repair, L knee scope may 2023.  PAIN:  Are you having pain? Yes: NPRS scale: up to 6 /10 Pain location: bil hip pain,L>R.  Pain description:   sore,  Aggravating factors: R side painful with sleeping on it, sitting, standing/walking Relieving factors: ice and rest  PRECAUTIONS: None  WEIGHT BEARING RESTRICTIONS No  FALLS:  Has patient fallen in last 6 months? No  LIVING  ENVIRONMENT: Lives with: lives with their spouse Lives in: House/apartment Stairs: Yes: Internal: 12 steps; on left going up and External: 2 steps; none Has following equipment at home: Walker - 2 wheeled cane  OCCUPATION:   PLOF: Independent takes care of her dogs/cats. - used to walk up to a half mile  PATIENT GOALS :  decreased pain in hips, improve strength of L leg.    OBJECTIVE:   DIAGNOSTIC FINDINGS:   COGNITION:  Overall cognitive status: Within functional limits for tasks assessed     SENSATION: WFL  Edema:    PALPATION: R hip: mild tenderness at gr troch, no pain down into ITB L hip: moderate tenderness at gr troch, into lateral thigh.  R knee: mild limitation for full extension.  Tender/trigger point at L levator     LE Measurements Hip ROM: WFL bil  Hip Strength:  L flex: 4-/5,  abd: 4-/5,   R: 4/5 Knee: L : 4/5,  R: 5/5   GAIT:   TODAY'S TREATMENT:  04/13/22: ther ex: see below for HEP   PATIENT EDUCATION:  Education details: on current presentation, on HEP,  PT POC Person educated: Patient Education method: Explanation, Demonstration, and Handouts Education comprehension: verbalized understanding   HOME EXERCISE PROGRAM: [id number] previous/knee  Access Code: [email or web address] URL: [email or web address] Date: 04/13/2022 Prepared by: Lyndee Hensen  Exercises - Supine Lower Trunk Rotation  - 2 x daily - 10 reps - 5 hold - Bent Knee Fallouts  - 1-2 x daily - 1 sets - 10 reps - 5 hold - Hooklying Clamshell with Resistance  - 1 x daily - 2 sets - 10 reps - Sidelying Hip Abduction  - 1 x daily - 1-2 sets - 5-10 reps - Straight Leg Raise  - 1 x daily - 1 sets - 5-10 reps  ASSESSMENT:  CLINICAL IMPRESSION: Pt presents with primary complaint of increased pain in bilateral hips. She has mild stiffness, and mild ROM limitations. She has tenderness in bil gr troch L>R. She has weakness in hip and glute muscles, and decreased strength  overall in L LE vs R. She has decreased ability for full functional activities, and will benefit from skilled PT to improve deficits and pain.   OBJECTIVE IMPAIRMENTS Abnormal gait, decreased activity tolerance, decreased balance, decreased endurance, decreased knowledge of use of DME, decreased mobility, difficulty walking, decreased ROM, decreased strength, increased edema, improper body mechanics, and pain.   ACTIVITY LIMITATIONS meal prep, cleaning, driving, and community activity.   PERSONAL FACTORS Time since onset of injury/illness/exacerbation are also affecting patient's functional outcome.    REHAB POTENTIAL: Good  CLINICAL DECISION MAKING: Stable/uncomplicated  EVALUATION COMPLEXITY: Low   GOALS: Goals reviewed with patient?  yes  SHORT TERM GOALS:    date: 04/27/2022   Patient will be independent in self management strategies to improve quality of life and functional outcomes. Goal status: INITIAL  2.  Patient will report at least 50% improvement in overall symptoms and/or function to demonstrate improved functional mobility  Goal status: INITIAL   LONG TERM GOALS:   date: 06/09/2022   Patient will report at least 75% improvement in overall symptoms and/or function to demonstrate improved functional mobility  Goal status: INITIAL  2.  Patient will be able to demonstrate at least 4+/5 MMT in L hip and LE,  to improve ability for stairs, gait and functional activity.   Goal status: INITIAL  3.  Pt to be independent with final HEP .     Goal status: INITIAL  4. Pt to report walking at least 1/2 mi, with pain 0-2/10 in hips, to improve ability for community activity.   Goal status: INITIAL     PLAN: PT FREQUENCY: 2x/week  PT DURATION: 8 weeks  PLANNED INTERVENTIONS: Therapeutic exercises, Therapeutic activity, Neuromuscular re-education, Balance training, Gait training, Patient/Family education, Joint manipulation, Joint mobilization, DME instructions,  Aquatic Therapy, Dry Needling, Spinal mobilization, Cryotherapy, Moist heat, Ultrasound, Ionotophoresis '4mg'$ /ml Dexamethasone, and Manual therapy  PLAN FOR NEXT SESSION:  Lyndee Hensen, PT, DPT 12:46 PM  04/13/22

## 2022-04-15 ENCOUNTER — Ambulatory Visit (INDEPENDENT_AMBULATORY_CARE_PROVIDER_SITE_OTHER): Payer: Medicare Other | Admitting: Physical Therapy

## 2022-04-15 ENCOUNTER — Encounter: Payer: Self-pay | Admitting: Physical Therapy

## 2022-04-15 DIAGNOSIS — G8929 Other chronic pain: Secondary | ICD-10-CM

## 2022-04-15 DIAGNOSIS — M25562 Pain in left knee: Secondary | ICD-10-CM

## 2022-04-15 DIAGNOSIS — M25551 Pain in right hip: Secondary | ICD-10-CM

## 2022-04-15 DIAGNOSIS — M25552 Pain in left hip: Secondary | ICD-10-CM

## 2022-04-15 DIAGNOSIS — M6281 Muscle weakness (generalized): Secondary | ICD-10-CM

## 2022-04-15 NOTE — Therapy (Signed)
OUTPATIENT PHYSICAL THERAPY LOWER EXTREMITY TREATMENT    Patient Name: Regana Phong MRN: [id number] DOB:06/19/1940, 82 y.o., female Today's Date: 04/15/22   PT End of Session - 04/15/22 1251     Visit Number 2    Number of Visits 16    Date for PT Re-Evaluation 06/08/22    Authorization Type Medicare    PT Start Time 1020    PT Stop Time 1100    PT Time Calculation (min) 40 min    Activity Tolerance Patient tolerated treatment well    Behavior During Therapy [facility] for tasks assessed/performed               Past Medical History:  Diagnosis Date   Anemia    Arthritis    CAD (coronary artery disease)    High cholesterol    Hypertension    Pancreatic cyst    SCCA (squamous cell carcinoma) of skin 2019   Right Arm [facility])   Sleep apnea    Squamous cell carcinoma of skin 2019   Left Leg [facility])   Thyroid disease    Past Surgical History:  Procedure Laterality Date   ABDOMINAL HYSTERECTOMY     APPENDECTOMY     BIOPSY  09/27/2019   Procedure: BIOPSY;  Surgeon: Milus Banister, MD;  [facility]: [facility];  Service: Endoscopy;;   BREAST EXCISIONAL BIOPSY Right    BUNIONECTOMY     carcinoma removed Right 09/2020   CYSTOCELE REPAIR     bladder repair   ESOPHAGOGASTRODUODENOSCOPY (EGD) WITH PROPOFOL N/A 09/27/2019   Procedure: ESOPHAGOGASTRODUODENOSCOPY (EGD) WITH PROPOFOL;  Surgeon: Milus Banister, MD;  [facility]: [facility];  Service: Endoscopy;  Laterality: N/A;   EUS N/A 09/27/2019   Procedure: UPPER ENDOSCOPIC ULTRASOUND (EUS) RADIAL;  Surgeon: Milus Banister, MD;  [facility];  Service: Endoscopy;  Laterality: N/A;   KNEE ARTHROSCOPY  06/2021   TYMPANOPLASTY Left 06/2020   VAGINAL PROLAPSE REPAIR  12/07/2019   Patient Active Problem List   Diagnosis Date Noted   Chondrocalcinosis of left knee 06/17/2021   Dry eyes 05/18/2021   Osteoarthritis 05/18/2021   Pelvic floor dysfunction 03/11/2021   Anxiety 09/15/2020   History of  melanoma 11/14/2019   Pancreatic insufficiency 07/17/2019   Stress 07/17/2019   B12 deficiency 05/29/2019   Chronic low back pain with sciatica 11/27/2018   Tinnitus of both ears 09/19/2018   Perforation of left tympanic membrane 09/19/2018   Stenosis of carotid artery 09/19/2018   Female bladder prolapse 09/19/2018   Essential hypertension 09/19/2018   Dyslipidemia 09/19/2018   Hypothyroidism 09/19/2018   Menopausal symptoms 09/19/2018   Insomnia 09/19/2018   Allergic rhinitis 09/19/2018   Osteopenia 09/19/2018    PCP: Vivi Barrack, MD  REFERRING PROVIDER: Vivi Barrack, MD  REFERRING DIAG: [phone] (ICD-10-CM) - Left knee pain, unspecified chronicity  THERAPY DIAG:  Pain in left hip  Pain in right hip  Chronic pain of left knee  Muscle weakness (generalized)  ONSET DATE: 2 weeks ago  SUBJECTIVE:   SUBJECTIVE STATEMENT: 04/15/22: Pt states mild soreness in L Hip. Has been doing HEP  Eval: Pt states Pain in both hips, ongoing for about 1 year. She has had 3 rounds of injections, last one very recent, this that it has helped. Still having some soreness   with standing in 1 place, or sititng for too long. Also states today she just twisted to get out of car, and has increased pain in L  side of back, and down L leg(has had in the past )  L leg feels weak to her.  Other: L knee pain, meniscal tear, scope in may 2023. Reports less knee pain since surgery   Other: has had previous back pain in L side low bac/SI, travels down into L thigh and into L foot.   PERTINENT HISTORY: HTN, hx of left bunion surgery repair, L knee scope may 2023.  PAIN:  Are you having pain? Yes: NPRS scale: up to 6 /10 Pain location: bil hip pain,L>R.  Pain description:   sore,  Aggravating factors: R side painful with sleeping on it, sitting, standing/walking Relieving factors: ice and rest  PRECAUTIONS: None  WEIGHT BEARING RESTRICTIONS No  FALLS:  Has patient fallen in last 6 months?  No  LIVING ENVIRONMENT: Lives with: lives with their spouse Lives in: House/apartment Stairs: Yes: Internal: 12 steps; on left going up and External: 2 steps; none Has following equipment at home: Walker - 2 wheeled cane  OCCUPATION:   PLOF: Independent takes care of her dogs/cats. - used to walk up to a half mile  PATIENT GOALS :  decreased pain in hips, improve strength of L leg.    OBJECTIVE:   DIAGNOSTIC FINDINGS:   COGNITION:  Overall cognitive status: Within functional limits for tasks assessed     SENSATION: WFL  Edema:    PALPATION: R hip: mild tenderness at gr troch, no pain down into ITB L hip: moderate tenderness at gr troch, into lateral thigh.  R knee: mild limitation for full extension.  Tender/trigger point at L levator     LE Measurements Hip ROM: WFL bil  Hip Strength:  L flex: 4-/5,  abd: 4-/5,   R: 4/5 Knee: L : 4/5,  R: 5/5   GAIT:   TODAY'S TREATMENT:  04/13/22: ther ex: see below for HEP  04/15/22: Therapeutic Exercise: Aerobic: Supine:  Clams GTB x 20;  S/L: hip abd x 10 on L;  Seated: LAQ 2 lb x 20 on L;  Standing:  Stretches: Hip ER fallouts x 15; Hip IR alternating x 10 bil; modified fig 4 supine x 3 bil;  LTR x 15; Seated piriformis x 1 min on L for HEP;  Neuromuscular Re-education: Manual Therapy: STM/TPR to R glute med, min, piriformis,  Therapeutic Activity: Self Care:    PATIENT EDUCATION:  Education details: reviewed and updated HEP Person educated: Patient Education method: Explanation, Demonstration, and Handouts Education comprehension: verbalized understanding   HOME EXERCISE PROGRAM: [id number] previous/knee Access Code: [email or web address]  new/hips   ASSESSMENT:  CLINICAL IMPRESSION: 04/15/2022  Pt with good ability for light exercises today. Reviewed light ROM and strength for HEP, and not to over do strengthening yet if hip still sore. Pt with much tenderness in L glute and gr troch on L. Very minimal on R. Plan to  progress strength as tolerated, and continue manual as needed.    Eval: Pt presents with primary complaint of increased pain in bilateral hips. She has mild stiffness, and mild ROM limitations. She has tenderness in bil gr troch L>R. She has weakness in hip and glute muscles, and decreased strength overall in L LE vs R. She has decreased ability for full functional activities, and will benefit from skilled PT to improve deficits and pain.   OBJECTIVE IMPAIRMENTS Abnormal gait, decreased activity tolerance, decreased balance, decreased endurance, decreased knowledge of use of DME, decreased mobility, difficulty walking, decreased ROM, decreased strength, increased edema, improper body mechanics, and  pain.   ACTIVITY LIMITATIONS meal prep, cleaning, driving, and community activity.   PERSONAL FACTORS Time since onset of injury/illness/exacerbation are also affecting patient's functional outcome.    REHAB POTENTIAL: Good  CLINICAL DECISION MAKING: Stable/uncomplicated  EVALUATION COMPLEXITY: Low   GOALS: Goals reviewed with patient?  yes  SHORT TERM GOALS:    date: 04/27/2022   Patient will be independent in self management strategies to improve quality of life and functional outcomes. Goal status: INITIAL  2.  Patient will report at least 50% improvement in overall symptoms and/or function to demonstrate improved functional mobility  Goal status: INITIAL   LONG TERM GOALS:   date: 06/09/2022   Patient will report at least 75% improvement in overall symptoms and/or function to demonstrate improved functional mobility  Goal status: INITIAL  2.  Patient will be able to demonstrate at least 4+/5 MMT in L hip and LE,  to improve ability for stairs, gait and functional activity.   Goal status: INITIAL  3.  Pt to be independent with final HEP .     Goal status: INITIAL  4. Pt to report walking at least 1/2 mi, with pain 0-2/10 in hips, to improve ability for community activity.    Goal status: INITIAL     PLAN: PT FREQUENCY: 2x/week  PT DURATION: 8 weeks  PLANNED INTERVENTIONS: Therapeutic exercises, Therapeutic activity, Neuromuscular re-education, Balance training, Gait training, Patient/Family education, Joint manipulation, Joint mobilization, DME instructions, Aquatic Therapy, Dry Needling, Spinal mobilization, Cryotherapy, Moist heat, Ultrasound, Ionotophoresis '4mg'$ /ml Dexamethasone, and Manual therapy  PLAN FOR NEXT SESSION:  sit tot stand  Lyndee Hensen, PT, DPT 12:52 PM  04/15/22

## 2022-04-19 ENCOUNTER — Encounter: Payer: Self-pay | Admitting: Physical Therapy

## 2022-04-19 ENCOUNTER — Ambulatory Visit (INDEPENDENT_AMBULATORY_CARE_PROVIDER_SITE_OTHER): Payer: Medicare Other | Admitting: Physical Therapy

## 2022-04-19 DIAGNOSIS — M6281 Muscle weakness (generalized): Secondary | ICD-10-CM | POA: Diagnosis not present

## 2022-04-19 DIAGNOSIS — G8929 Other chronic pain: Secondary | ICD-10-CM

## 2022-04-19 DIAGNOSIS — M25552 Pain in left hip: Secondary | ICD-10-CM | POA: Diagnosis not present

## 2022-04-19 DIAGNOSIS — M25562 Pain in left knee: Secondary | ICD-10-CM

## 2022-04-19 DIAGNOSIS — M25551 Pain in right hip: Secondary | ICD-10-CM | POA: Diagnosis not present

## 2022-04-19 NOTE — Therapy (Signed)
OUTPATIENT PHYSICAL THERAPY LOWER EXTREMITY TREATMENT    Patient Name: Linda Knight MRN: [id number] DOB:1940/08/29, 82 y.o., female Today's Date: 04/19/22   PT End of Session - 04/19/22 1430     Visit Number 3    Number of Visits 16    Date for PT Re-Evaluation 06/08/22    Authorization Type Medicare    PT Start Time 1435    PT Stop Time 1515    PT Time Calculation (min) 40 min    Activity Tolerance Patient tolerated treatment well    Behavior During Therapy [facility] for tasks assessed/performed               Past Medical History:  Diagnosis Date   Anemia    Arthritis    CAD (coronary artery disease)    High cholesterol    Hypertension    Pancreatic cyst    SCCA (squamous cell carcinoma) of skin 2019   Right Arm [facility])   Sleep apnea    Squamous cell carcinoma of skin 2019   Left Leg [facility])   Thyroid disease    Past Surgical History:  Procedure Laterality Date   ABDOMINAL HYSTERECTOMY     APPENDECTOMY     BIOPSY  09/27/2019   Procedure: BIOPSY;  Surgeon: Milus Banister, MD;  Location: [facility];  Service: Endoscopy;;   BREAST EXCISIONAL BIOPSY Right    BUNIONECTOMY     carcinoma removed Right 09/2020   CYSTOCELE REPAIR     bladder repair   ESOPHAGOGASTRODUODENOSCOPY (EGD) WITH PROPOFOL N/A 09/27/2019   Procedure: ESOPHAGOGASTRODUODENOSCOPY (EGD) WITH PROPOFOL;  Surgeon: Milus Banister, MD;  Location: [facility];  Service: Endoscopy;  Laterality: N/A;   EUS N/A 09/27/2019   Procedure: UPPER ENDOSCOPIC ULTRASOUND (EUS) RADIAL;  Surgeon: Milus Banister, MD;  Location: [facility];  Service: Endoscopy;  Laterality: N/A;   KNEE ARTHROSCOPY  06/2021   TYMPANOPLASTY Left 06/2020   VAGINAL PROLAPSE REPAIR  12/07/2019   Patient Active Problem List   Diagnosis Date Noted   Chondrocalcinosis of left knee 06/17/2021   Dry eyes 05/18/2021   Osteoarthritis 05/18/2021   Pelvic floor dysfunction 03/11/2021   Anxiety 09/15/2020   History of  melanoma 11/14/2019   Pancreatic insufficiency 07/17/2019   Stress 07/17/2019   B12 deficiency 05/29/2019   Chronic low back pain with sciatica 11/27/2018   Tinnitus of both ears 09/19/2018   Perforation of left tympanic membrane 09/19/2018   Stenosis of carotid artery 09/19/2018   Female bladder prolapse 09/19/2018   Essential hypertension 09/19/2018   Dyslipidemia 09/19/2018   Hypothyroidism 09/19/2018   Menopausal symptoms 09/19/2018   Insomnia 09/19/2018   Allergic rhinitis 09/19/2018   Osteopenia 09/19/2018    PCP: Vivi Barrack, MD  REFERRING PROVIDER: Vivi Barrack, MD  REFERRING DIAG: [phone] (ICD-10-CM) - Left knee pain, unspecified chronicity  THERAPY DIAG:  Pain in left hip  Pain in right hip  Chronic pain of left knee  Muscle weakness (generalized)  ONSET DATE: 2 weeks ago  SUBJECTIVE:   SUBJECTIVE STATEMENT: 04/19/22: Pt states mild soreness in L Hip and knee.  Thinks hip is doing better. Has been doing HEP  Eval: Pt states Pain in both hips, ongoing for about 1 year. She has had 3 rounds of injections, last one very recent, this that it has helped. Still having some soreness   with standing in 1 place, or sititng for too long. Also states today she just twisted to get out  of car, and has increased pain in L side of back, and down L leg(has had in the past )  L leg feels weak to her.  Other: L knee pain, meniscal tear, scope in may 2023. Reports less knee pain since surgery   Other: has had previous back pain in L side low bac/SI, travels down into L thigh and into L foot.   PERTINENT HISTORY: HTN, hx of left bunion surgery repair, L knee scope may 2023.  PAIN:  Are you having pain? Yes: NPRS scale: up to 6 /10 Pain location: bil hip pain,L>R.  Pain description:   sore,  Aggravating factors: R side painful with sleeping on it, sitting, standing/walking Relieving factors: ice and rest  PRECAUTIONS: None  WEIGHT BEARING RESTRICTIONS No  FALLS:   Has patient fallen in last 6 months? No  LIVING ENVIRONMENT: Lives with: lives with their spouse Lives in: House/apartment Stairs: Yes: Internal: 12 steps; on left going up and External: 2 steps; none Has following equipment at home: Walker - 2 wheeled cane  OCCUPATION:   PLOF: Independent takes care of her dogs/cats. - used to walk up to a half mile  PATIENT GOALS :  decreased pain in hips, improve strength of L leg.    OBJECTIVE:   DIAGNOSTIC FINDINGS:   COGNITION:  Overall cognitive status: Within functional limits for tasks assessed     SENSATION: WFL  Edema:    PALPATION: R hip: mild tenderness at gr troch, no pain down into ITB L hip: moderate tenderness at gr troch, into lateral thigh.  R knee: mild limitation for full extension.  Tender/trigger point at L levator     LE Measurements Hip ROM: WFL bil  Hip Strength:  L flex: 4-/5,  abd: 4-/5,   R: 4/5 Knee: L : 4/5,  R: 5/5   GAIT:   TODAY'S TREATMENT:   04/19/22: Therapeutic Exercise: Aerobic: Bike L1 x 5 min Supine:  Clams GTB x 20; SLR with TA x 10 bil;  Seated: LAQ 2.5 lb x 20 on L. Sit to stand x 5;  Standing: Marching x 10; Hip abd 2 x 10 bil; Stretches: Hip ER fallouts x 15; Hip IR alternating x 10 bil; seated fig 4 supine x 3 bil;  LTR x 15;  Neuromuscular Re-education: Manual Therapy:  Therapeutic Activity: Self Care:    PATIENT EDUCATION:  Education details: reviewed and updated HEP Person educated: Patient Education method: Explanation, Demonstration, and Handouts Education comprehension: verbalized understanding   HOME EXERCISE PROGRAM: [id number] previous/knee Access Code: [email or web address]  new/hips   ASSESSMENT:  CLINICAL IMPRESSION: 04/19/2022  Pt with good tolerance for activities today. Mild fatigue in L hip with standing exercises. Plan to progress mobility and light strength as tolerated. Also added strength for L knee today, will benefit from continued strengthening.    Eval: Pt presents with primary complaint of increased pain in bilateral hips. She has mild stiffness, and mild ROM limitations. She has tenderness in bil gr troch L>R. She has weakness in hip and glute muscles, and decreased strength overall in L LE vs R. She has decreased ability for full functional activities, and will benefit from skilled PT to improve deficits and pain.   OBJECTIVE IMPAIRMENTS Abnormal gait, decreased activity tolerance, decreased balance, decreased endurance, decreased knowledge of use of DME, decreased mobility, difficulty walking, decreased ROM, decreased strength, increased edema, improper body mechanics, and pain.   ACTIVITY LIMITATIONS meal prep, cleaning, driving, and community activity.   PERSONAL FACTORS Time  since onset of injury/illness/exacerbation are also affecting patient's functional outcome.    REHAB POTENTIAL: Good  CLINICAL DECISION MAKING: Stable/uncomplicated  EVALUATION COMPLEXITY: Low   GOALS: Goals reviewed with patient?  yes  SHORT TERM GOALS:    date: 04/27/2022   Patient will be independent in self management strategies to improve quality of life and functional outcomes. Goal status: INITIAL  2.  Patient will report at least 50% improvement in overall symptoms and/or function to demonstrate improved functional mobility  Goal status: INITIAL   LONG TERM GOALS:   date: 06/09/2022   Patient will report at least 75% improvement in overall symptoms and/or function to demonstrate improved functional mobility  Goal status: INITIAL  2.  Patient will be able to demonstrate at least 4+/5 MMT in L hip and LE,  to improve ability for stairs, gait and functional activity.   Goal status: INITIAL  3.  Pt to be independent with final HEP .     Goal status: INITIAL  4. Pt to report walking at least 1/2 mi, with pain 0-2/10 in hips, to improve ability for community activity.   Goal status: INITIAL     PLAN: PT FREQUENCY: 2x/week  PT  DURATION: 8 weeks  PLANNED INTERVENTIONS: Therapeutic exercises, Therapeutic activity, Neuromuscular re-education, Balance training, Gait training, Patient/Family education, Joint manipulation, Joint mobilization, DME instructions, Aquatic Therapy, Dry Needling, Spinal mobilization, Cryotherapy, Moist heat, Ultrasound, Ionotophoresis '4mg'$ /ml Dexamethasone, and Manual therapy  PLAN FOR NEXT SESSION:  sit tot stand  Lyndee Hensen, PT, DPT 9:07 PM  04/19/22

## 2022-04-21 ENCOUNTER — Encounter: Payer: Self-pay | Admitting: Gastroenterology

## 2022-04-21 ENCOUNTER — Ambulatory Visit (INDEPENDENT_AMBULATORY_CARE_PROVIDER_SITE_OTHER): Payer: Medicare Other | Admitting: Gastroenterology

## 2022-04-21 VITALS — BP 138/76 | HR 53 | Ht 64.0 in | Wt 140.0 lb

## 2022-04-21 DIAGNOSIS — K862 Cyst of pancreas: Secondary | ICD-10-CM | POA: Diagnosis not present

## 2022-04-21 DIAGNOSIS — R194 Change in bowel habit: Secondary | ICD-10-CM | POA: Diagnosis not present

## 2022-04-21 DIAGNOSIS — K8689 Other specified diseases of pancreas: Secondary | ICD-10-CM | POA: Diagnosis not present

## 2022-04-21 MED ORDER — PANCRELIPASE (LIP-PROT-AMYL) 36000-114000 UNITS PO CPEP: ORAL_CAPSULE | ORAL | 11 refills | Status: DC

## 2022-04-21 NOTE — Patient Instructions (Addendum)
Resume Creon at current doses  Use Metamucil or Citrucel QD, increasing to BID after 7 days if no improvement  Follow up in 3 months or sooner if needed.  _______________________________________________________  If your blood pressure at your visit was 140/90 or greater, please contact your primary care physician to follow up on this.  _______________________________________________________  If you are age 81 or older, your body mass index should be between 23-30. Your Body mass index is 24.03 kg/m. If this is out of the aforementioned range listed, please consider follow up with your Primary Care Provider.  If you are age 64 or younger, your body mass index should be between 19-25. Your Body mass index is 24.03 kg/m. If this is out of the aformentioned range listed, please consider follow up with your Primary Care Provider.   ________________________________________________________  The [place] [facility] providers would like to encourage you to use [facility] to communicate with providers for non-urgent requests or questions.  Due to long hold times on the telephone, sending your provider a message by [facility] may be a faster and more efficient way to get a response.  Please allow 48 business hours for a response.  Please remember that this is for non-urgent requests.  _______________________________________________________

## 2022-04-21 NOTE — Progress Notes (Signed)
Referring Provider: Vivi Barrack, MD Primary Care Physician:  Vivi Barrack, MD  Chief complaint: Incontinence  IMPRESSION:  Pancreatic insufficiency    - recurrent symptoms after stopping Creon Pancreatic cyst and dilated pancreatic duct on recent CT and MRI    - normal pancreas on CT in [facility] in 2019    - Abnormal pancreas on CT 07/05/19    - Abnormal pancreas on MRI 08/10/19    - EGD/EUS 09/27/19:        - small 5-36m pancreatic cysts (body and head). No solid lesions. Nl PD.     - MRI/MRCP 10/30/20: no change    - MRI/MRCP 11/24/21: overall stable History of altered bowel habits following colpopexy and bladder repair 10/21    - no improvement with Xifaxan x 14 days for possible SIBO Periampullary duodenal diverticulum Abnormal MR lumbar spine 2021 Elevated lipase 07/2019 Recent abdominal pain, resolved with dietary changes Daily alcohol use, with history of heavier use Glucose intolerance Chronic constipation treated with Miralax and magnesium Adenomyomatosis of the gallbladder fundus. [facility] Former smoker    PLAN: - Resume Creon at prior doses - Use Metamucil or Citrucel QD, increasing to BID after 7 days if no improvement - Low threshold to consider CT abd/pelvis if symptoms persist - Follow-up MRI due in 2 years re: pancreatic cyst that has been stable on recent imaging - Office follow-up in 3 months, earlier if needed   HPI: Linda Knight a 82y.o. female returns in follow-up. She was last seen in the office 12/01/21.   She was initially  referred by Dr. PJerline Painfor further evaluation of abdominal pain and abnormal pancreas imaging.  Pain resolved with dietary changes to avoid alcohol, dairy, fried foods, greasy foods, spicy foods.   CT scan with and without contrast 07/05/19: pancreatic atrophy with focal distension of the main pancreatic duct to approximately 61m Peripheral pancreatic duct is nondilated. Subtle low  attenuation in the head of the pancreas extending towards the atrophied uncinate measuring 46m746mBaseline density not clearly cystic but small.  Mild bilteral ureteral distension without obstructing lesion. Small kidney cysts.   MRI of the abdomen 08/10/19: small lesions in the pancreatic head show ductal communication. Main pancreat duct dilated to just at or above 5mm43mdditional 6mm 55mus in the body of the pancreas. Adenomyomatosis of the gallbladder fundus.  EGD/EUS 09/27/19: gastritis, two small pancreatic cysts (body and head) measuring 5-6mm. 97msolid lesions in the pancreas. Normal pancreatic duct. No LAD. Periampullary duodenal diverticulum.   MRI/MRCP 10/30/20: no change in cluster of small cysts in the pancreatic head the largest of which appears to communicate with the pancreatic duct. There is downstream duct dilation through the pancreatic head and mild upstread duct dilation suspicious for side branch intraductal papillary mucinous tumor. No worrisome features.   Office visit 10/21/20: Reported longstanding history of constipation treated with Metamucil and magnesium PRN. Constipation worsened after sacral colpopexy and laparoscopic bladder repair 11/2019. Recently called the office with worsening constipation despite increasing fiber and water intake in addition to Miralax QD. But, constipation has since been replaced with loose stools. No associated abdominal pain.  Colonoscopy 11/03/20 for altered bowel habits: biopsies show mild edema, otherwise normal. Left-sided diverticulosis. Internal hemorrhoids.   Office follow-up 11/14/20:  Returned in follow-up after her colonoscopy. Uses Benefiber or Miralax when she feels constipation might be coming on. She is finding regulating her bowels very difficulty.  In  the last couple of weeks her symptoms may be leveling out.   Creon before meals helps with abdominal pain and post-prandial gas pains.   MRI/MRCP 11/24/21: Stable 7 mm cystic lesion in the  head of the pancreas.  This is not changed compared to 08/10/2019.  Follow-up pancreatic protocol MRI recommended in 2 years.  There is also a stable 2.4 cm cyst in the dome of the liver.  This may be a cystadenoma based on mild internal septation.  There is also a benign left kidney cyst that requires no further work-up.  She has lumbar scoliosis with rotatory component and partial butterfly vertebra T10.  Office visit 11/27/2021 concerns of constipation and RLQ pain abdominal pain. She thinks this may be related to her diet. Described as "child birth" pain that radiates around to her back. Most intense when she changes positions or after she's been on her feet for an extended period of time. She has an appointment with Dr. Zigmund Daniel tomorrow for evaluation of urinary leakage. She awoke at 3am and had an accident prior to getting to the bathroom. She stopped taking Creon because of constipation.  She has previously required steroid injections for sciatica, but it has been several years.   Returns today with concerns for change in bowel habits with return of incontinence that started in December. Symptoms onset coincides with her discontinuing the Creon out of concerns for polypharmacy. She has started [place] for urinary incontinence and those symptoms are largely improved. Since December, she has had some accidents, particularly in the morning. No blood or mucous in the stool. Appetite is good. Weight is stable.    Recent labs: Labs 10/23/20: pancreatic elastase 42 Labs 12/19/20: pancreatic elastase >500 Labs 12/09/21: pancreatic elastase >500 Labs 02/10/22: normal CMP except for a K level of 3.3, normal CBC, ESR 21, TSH 2.57   Past Medical History:  Diagnosis Date   Anemia    Arthritis    CAD (coronary artery disease)    High cholesterol    Hypertension    Pancreatic cyst    SCCA (squamous cell carcinoma) of skin 2019   Right Arm [facility])   Sleep apnea    Squamous cell carcinoma of skin 2019    Left Leg [facility])   Thyroid disease     Past Surgical History:  Procedure Laterality Date   ABDOMINAL HYSTERECTOMY     APPENDECTOMY     BIOPSY  09/27/2019   Procedure: BIOPSY;  Surgeon: Milus Banister, MD;  [facility];  Service: Endoscopy;;   BREAST EXCISIONAL BIOPSY Right    BUNIONECTOMY     carcinoma removed Right 09/2020   CYSTOCELE REPAIR     bladder repair   ESOPHAGOGASTRODUODENOSCOPY (EGD) WITH PROPOFOL N/A 09/27/2019   Procedure: ESOPHAGOGASTRODUODENOSCOPY (EGD) WITH PROPOFOL;  Surgeon: Milus Banister, MD;  [facility];  Service: Endoscopy;  Laterality: N/A;   EUS N/A 09/27/2019   Procedure: UPPER ENDOSCOPIC ULTRASOUND (EUS) RADIAL;  Surgeon: Milus Banister, MD;  [facility];  Service: Endoscopy;  Laterality: N/A;   KNEE ARTHROSCOPY  06/2021   TYMPANOPLASTY Left 06/2020   VAGINAL PROLAPSE REPAIR  12/07/2019    Current Outpatient Medications  Medication Sig Dispense Refill   amLODipine-benazepril (LOTREL) 5-10 MG capsule Take 1 capsule by mouth daily. 90 capsule 1   aspirin EC 81 MG tablet Take 81 mg by mouth daily. Swallow whole.     atorvastatin (LIPITOR) 20 MG tablet Take 1 tablet (20 mg total) by  mouth daily. 90 tablet 2   celecoxib (CELEBREX) 200 MG capsule Take 1 capsule (200 mg total) by mouth 2 (two) times daily. 60 capsule 0   citalopram (CELEXA) 10 MG tablet TAKE 1 TABLET DAILY 90 tablet 3   conjugated estrogens (PREMARIN) vaginal cream Place 1 Applicatorful vaginally once a week. 42.5 g 12   GEMTESA 75 MG TABS Take 1 tablet by mouth daily.     hydrochlorothiazide (HYDRODIURIL) 25 MG tablet Take 0.5 tablets (12.5 mg total) by mouth daily. 90 tablet 1   levothyroxine (SYNTHROID) 50 MCG tablet Take 1 tablet (50 mcg total) by mouth daily. 90 tablet 3   POTASSIUM CHLORIDE PO Take by mouth.     zolpidem (AMBIEN CR) 12.5 MG CR tablet TAKE 1 TABLET(12.5 MG) BY MOUTH AT BEDTIME AS NEEDED FOR SLEEP 30 tablet 5   azelastine  (ASTELIN) 0.1 % nasal spray Place 2 sprays into both nostrils 2 (two) times daily. 54 mL 0   No current facility-administered medications for this visit.    Allergies as of 04/21/2022 - Review Complete 04/21/2022  Allergen Reaction Noted   Tape Rash 09/29/2020    Family History  Problem Relation Age of Onset   Hyperlipidemia Mother    Hypertension Mother    Stroke Mother    Lung disease Father    Heart disease Brother    Colon cancer Neg Hx      Physical Exam: General:   Alert,  well-nourished, pleasant and cooperative in NAD Head:  Normocephalic and atraumatic. Eyes:  Sclera clear, no icterus.   Conjunctiva pink. Abdomen:  Soft, nontender, nondistended, normal bowel sounds Skin:  Intact without significant lesions or rashes. Psych:  Alert and cooperative. Normal mood and affect.  I spent 26 minutes, including in depth chart review, independent review of results, communicating results with the patient directly, face-to-face time with the patient, coordinating care, and ordering studies and medications as appropriate, and documentation.    Adaleah Forget L. Tarri Glenn, MD, MPH 04/21/2022, 9:34 AM

## 2022-04-22 ENCOUNTER — Encounter: Payer: Self-pay | Admitting: Physical Therapy

## 2022-04-22 ENCOUNTER — Ambulatory Visit (INDEPENDENT_AMBULATORY_CARE_PROVIDER_SITE_OTHER): Payer: Medicare Other | Admitting: Physical Therapy

## 2022-04-22 DIAGNOSIS — M6281 Muscle weakness (generalized): Secondary | ICD-10-CM | POA: Diagnosis not present

## 2022-04-22 DIAGNOSIS — M25551 Pain in right hip: Secondary | ICD-10-CM

## 2022-04-22 DIAGNOSIS — M25562 Pain in left knee: Secondary | ICD-10-CM | POA: Diagnosis not present

## 2022-04-22 DIAGNOSIS — M25552 Pain in left hip: Secondary | ICD-10-CM

## 2022-04-22 DIAGNOSIS — G8929 Other chronic pain: Secondary | ICD-10-CM

## 2022-04-22 NOTE — Therapy (Signed)
OUTPATIENT PHYSICAL THERAPY LOWER EXTREMITY TREATMENT    Patient Name: Linda Knight MRN: [id number] DOB:Feb 03, 1941, 82 y.o., female Today's Date: 04/22/22   PT End of Session - 04/22/22 1027     Visit Number 4    Number of Visits 16    Date for PT Re-Evaluation 06/08/22    Authorization Type Medicare    PT Start Time 1021    PT Stop Time 1100    PT Time Calculation (min) 39 min    Activity Tolerance Patient tolerated treatment well    Behavior During Therapy [facility] for tasks assessed/performed                Past Medical History:  Diagnosis Date   Anemia    Arthritis    CAD (coronary artery disease)    High cholesterol    Hypertension    Pancreatic cyst    SCCA (squamous cell carcinoma) of skin 2019   Right Arm [facility])   Sleep apnea    Squamous cell carcinoma of skin 2019   Left Leg [facility])   Thyroid disease    Past Surgical History:  Procedure Laterality Date   ABDOMINAL HYSTERECTOMY     APPENDECTOMY     BIOPSY  09/27/2019   Procedure: BIOPSY;  Surgeon: Milus Banister, MD;  Location: [facility];  Service: Endoscopy;;   BREAST EXCISIONAL BIOPSY Right    BUNIONECTOMY     carcinoma removed Right 09/2020   CYSTOCELE REPAIR     bladder repair   ESOPHAGOGASTRODUODENOSCOPY (EGD) WITH PROPOFOL N/A 09/27/2019   Procedure: ESOPHAGOGASTRODUODENOSCOPY (EGD) WITH PROPOFOL;  Surgeon: Milus Banister, MD;  Location: [facility];  Service: Endoscopy;  Laterality: N/A;   EUS N/A 09/27/2019   Procedure: UPPER ENDOSCOPIC ULTRASOUND (EUS) RADIAL;  Surgeon: Milus Banister, MD;  Location: [facility];  Service: Endoscopy;  Laterality: N/A;   KNEE ARTHROSCOPY  06/2021   TYMPANOPLASTY Left 06/2020   VAGINAL PROLAPSE REPAIR  12/07/2019   Patient Active Problem List   Diagnosis Date Noted   Chondrocalcinosis of left knee 06/17/2021   Dry eyes 05/18/2021   Osteoarthritis 05/18/2021   Pelvic floor dysfunction 03/11/2021   Anxiety 09/15/2020   History of  melanoma 11/14/2019   Pancreatic insufficiency 07/17/2019   Stress 07/17/2019   B12 deficiency 05/29/2019   Chronic low back pain with sciatica 11/27/2018   Tinnitus of both ears 09/19/2018   Perforation of left tympanic membrane 09/19/2018   Stenosis of carotid artery 09/19/2018   Female bladder prolapse 09/19/2018   Essential hypertension 09/19/2018   Dyslipidemia 09/19/2018   Hypothyroidism 09/19/2018   Menopausal symptoms 09/19/2018   Insomnia 09/19/2018   Allergic rhinitis 09/19/2018   Osteopenia 09/19/2018    PCP: Vivi Barrack, MD  REFERRING PROVIDER: Vivi Barrack, MD  REFERRING DIAG: [phone] (ICD-10-CM) - Left knee pain, unspecified chronicity  THERAPY DIAG:  Pain in left hip  Pain in right hip  Chronic pain of left knee  Muscle weakness (generalized)  ONSET DATE: 2 weeks ago  SUBJECTIVE:   SUBJECTIVE STATEMENT: 04/22/22: Pt states increased soreness in L LE yesterday and today, in glute. Had a lot of cramping in L calf last night, calf now sore today.   Eval: Pt states Pain in both hips, ongoing for about 1 year. She has had 3 rounds of injections, last one very recent, this that it has helped. Still having some soreness   with standing in 1 place, or sititng for too long.  Also states today she just twisted to get out of car, and has increased pain in L side of back, and down L leg(has had in the past )  L leg feels weak to her.  Other: L knee pain, meniscal tear, scope in may 2023. Reports less knee pain since surgery   Other: has had previous back pain in L side low bac/SI, travels down into L thigh and into L foot.   PERTINENT HISTORY: HTN, hx of left bunion surgery repair, L knee scope may 2023.  PAIN:  Are you having pain? Yes: NPRS scale: up to 6 /10 Pain location: bil hip pain,L>R.  Pain description:   sore,  Aggravating factors: R side painful with sleeping on it, sitting, standing/walking Relieving factors: ice and rest  PRECAUTIONS:  None  WEIGHT BEARING RESTRICTIONS No  FALLS:  Has patient fallen in last 6 months? No  LIVING ENVIRONMENT: Lives with: lives with their spouse Lives in: House/apartment Stairs: Yes: Internal: 12 steps; on left going up and External: 2 steps; none Has following equipment at home: Walker - 2 wheeled cane  OCCUPATION:   PLOF: Independent takes care of her dogs/cats. - used to walk up to a half mile  PATIENT GOALS :  decreased pain in hips, improve strength of L leg.    OBJECTIVE:   DIAGNOSTIC FINDINGS:   COGNITION:  Overall cognitive status: Within functional limits for tasks assessed     SENSATION: WFL  Edema:    PALPATION: R hip: mild tenderness at gr troch, no pain down into ITB L hip: moderate tenderness at gr troch, into lateral thigh.  R knee: mild limitation for full extension.  Tender/trigger point at L levator     LE Measurements Hip ROM: WFL bil  Hip Strength:  L flex: 4-/5,  abd: 4-/5,   R: 4/5 Knee: L : 4/5,  R: 5/5   GAIT:   TODAY'S TREATMENT:  04/22/22: Therapeutic Exercise: Aerobic:  Supine:  Clams GTB with TA x 20; SLR with TA x 10 bil;  TA with breathing x 15, education on achieving contraction. TA with supine march x 15;  Seated: LAQ 2.5 lb x 20 bil;  Sit to stand x 5;  Standing: ; Stretches: Hip ER fallouts x 10;   LTR x 15; calf stretch on step x 3;  Neuromuscular Re-education: Manual Therapy: STM/DTM to L low back and glute , long leg distraction on L, (holding at knee)  Therapeutic Activity: Self Care:   04/19/22: Therapeutic Exercise: Aerobic: Bike L1 x 5 min Supine:  Clams GTB x 20   SLR with TA x 10 bil;  Seated: LAQ 2.5 lb x 20 on L. Sit to stand x 5;  Standing: Marching x 10; Hip abd 2 x 10 bil; Stretches: Hip ER fallouts x 15; Hip IR alternating x 10 bil; seated fig 4 supine x 3 bil;  LTR x 15;  Neuromuscular Re-education: Manual Therapy:  Therapeutic Activity: Self Care:   PATIENT EDUCATION:  Education details:  reviewed and updated HEP Person educated: Patient Education method: Explanation, Demonstration, and Handouts Education comprehension: verbalized understanding   HOME EXERCISE PROGRAM: [id number] previous/knee Access Code: [email or web address]  new/hips   ASSESSMENT:  CLINICAL IMPRESSION: 04/22/2022  Pt with increased soreness in low back and glute today. Practice for calf stretch on step, as her calf is sore from cramping last night. Discussed continuing lumbar and hip mobility and keeping  activity lighter today if she is sore. Plan to progress light strength  as tolerated.   Eval: Pt presents with primary complaint of increased pain in bilateral hips. She has mild stiffness, and mild ROM limitations. She has tenderness in bil gr troch L>R. She has weakness in hip and glute muscles, and decreased strength overall in L LE vs R. She has decreased ability for full functional activities, and will benefit from skilled PT to improve deficits and pain.   OBJECTIVE IMPAIRMENTS Abnormal gait, decreased activity tolerance, decreased balance, decreased endurance, decreased knowledge of use of DME, decreased mobility, difficulty walking, decreased ROM, decreased strength, increased edema, improper body mechanics, and pain.   ACTIVITY LIMITATIONS meal prep, cleaning, driving, and community activity.   PERSONAL FACTORS Time since onset of injury/illness/exacerbation are also affecting patient's functional outcome.    REHAB POTENTIAL: Good  CLINICAL DECISION MAKING: Stable/uncomplicated  EVALUATION COMPLEXITY: Low   GOALS: Goals reviewed with patient?  yes  SHORT TERM GOALS:    date: 04/27/2022   Patient will be independent in self management strategies to improve quality of life and functional outcomes. Goal status: INITIAL  2.  Patient will report at least 50% improvement in overall symptoms and/or function to demonstrate improved functional mobility  Goal status: INITIAL   LONG TERM GOALS:   date:  06/09/2022   Patient will report at least 75% improvement in overall symptoms and/or function to demonstrate improved functional mobility  Goal status: INITIAL  2.  Patient will be able to demonstrate at least 4+/5 MMT in L hip and LE,  to improve ability for stairs, gait and functional activity.   Goal status: INITIAL  3.  Pt to be independent with final HEP .     Goal status: INITIAL  4. Pt to report walking at least 1/2 mi, with pain 0-2/10 in hips, to improve ability for community activity.   Goal status: INITIAL     PLAN: PT FREQUENCY: 2x/week  PT DURATION: 8 weeks  PLANNED INTERVENTIONS: Therapeutic exercises, Therapeutic activity, Neuromuscular re-education, Balance training, Gait training, Patient/Family education, Joint manipulation, Joint mobilization, DME instructions, Aquatic Therapy, Dry Needling, Spinal mobilization, Cryotherapy, Moist heat, Ultrasound, Ionotophoresis '4mg'$ /ml Dexamethasone, and Manual therapy  PLAN FOR NEXT SESSION:  sit tot stand  Lyndee Hensen, PT, DPT 12:35 PM  04/22/22

## 2022-04-26 ENCOUNTER — Encounter: Payer: Self-pay | Admitting: Physical Therapy

## 2022-04-26 ENCOUNTER — Ambulatory Visit (INDEPENDENT_AMBULATORY_CARE_PROVIDER_SITE_OTHER): Payer: Medicare Other | Admitting: Physical Therapy

## 2022-04-26 DIAGNOSIS — M6281 Muscle weakness (generalized): Secondary | ICD-10-CM

## 2022-04-26 DIAGNOSIS — M25552 Pain in left hip: Secondary | ICD-10-CM

## 2022-04-26 DIAGNOSIS — M25562 Pain in left knee: Secondary | ICD-10-CM

## 2022-04-26 DIAGNOSIS — M25551 Pain in right hip: Secondary | ICD-10-CM

## 2022-04-26 DIAGNOSIS — G8929 Other chronic pain: Secondary | ICD-10-CM

## 2022-04-26 NOTE — Therapy (Addendum)
OUTPATIENT PHYSICAL THERAPY LOWER EXTREMITY TREATMENT    Patient Name: Linda Knight MRN: [id number] DOB:10-23-1940, 82 y.o., female Today's Date: 04/26/22   PT End of Session - 04/26/22 1322     Visit Number 5    Number of Visits 16    Date for PT Re-Evaluation 06/08/22    Authorization Type Medicare    PT Start Time 1302    PT Stop Time 1340    PT Time Calculation (min) 38 min    Activity Tolerance Patient tolerated treatment well    Behavior During Therapy [facility] for tasks assessed/performed                 Past Medical History:  Diagnosis Date   Anemia    Arthritis    CAD (coronary artery disease)    High cholesterol    Hypertension    Pancreatic cyst    SCCA (squamous cell carcinoma) of skin 2019   Right Arm [facility])   Sleep apnea    Squamous cell carcinoma of skin 2019   Left Leg [facility])   Thyroid disease    Past Surgical History:  Procedure Laterality Date   ABDOMINAL HYSTERECTOMY     APPENDECTOMY     BIOPSY  09/27/2019   Procedure: BIOPSY;  Surgeon: Rachael Fee, MD;  [facility];  Service: Endoscopy;;   BREAST EXCISIONAL BIOPSY Right    BUNIONECTOMY     carcinoma removed Right 09/2020   CYSTOCELE REPAIR     bladder repair   ESOPHAGOGASTRODUODENOSCOPY (EGD) WITH PROPOFOL N/A 09/27/2019   Procedure: ESOPHAGOGASTRODUODENOSCOPY (EGD) WITH PROPOFOL;  Surgeon: Rachael Fee, MD;  Location: [facility];  Service: Endoscopy;  Laterality: N/A;   EUS N/A 09/27/2019   Procedure: UPPER ENDOSCOPIC ULTRASOUND (EUS) RADIAL;  Surgeon: Rachael Fee, MD;  [facility];  Service: Endoscopy;  Laterality: N/A;   KNEE ARTHROSCOPY  06/2021   TYMPANOPLASTY Left 06/2020   VAGINAL PROLAPSE REPAIR  12/07/2019   Patient Active Problem List   Diagnosis Date Noted   Chondrocalcinosis of left knee 06/17/2021   Dry eyes 05/18/2021   Osteoarthritis 05/18/2021   Pelvic floor dysfunction 03/11/2021   Anxiety 09/15/2020   History  of melanoma 11/14/2019   Pancreatic insufficiency 07/17/2019   Stress 07/17/2019   B12 deficiency 05/29/2019   Chronic low back pain with sciatica 11/27/2018   Tinnitus of both ears 09/19/2018   Perforation of left tympanic membrane 09/19/2018   Stenosis of carotid artery 09/19/2018   Female bladder prolapse 09/19/2018   Essential hypertension 09/19/2018   Dyslipidemia 09/19/2018   Hypothyroidism 09/19/2018   Menopausal symptoms 09/19/2018   Insomnia 09/19/2018   Allergic rhinitis 09/19/2018   Osteopenia 09/19/2018    PCP: Ardith Dark, MD  REFERRING PROVIDER: Ardith Dark, MD  REFERRING DIAG: [phone] (ICD-10-CM) - Left knee pain, unspecified chronicity  THERAPY DIAG:  Pain in left hip  Pain in right hip  Chronic pain of left knee  Muscle weakness (generalized)  ONSET DATE: 2 weeks ago  SUBJECTIVE:   SUBJECTIVE STATEMENT: 04/26/22: Pt states improving pain in hips and knees. Back/leg better today also. R levator is sore today, thinks she turned wrong earlier today.   Eval: Pt states Pain in both hips, ongoing for about 1 year. She has had 3 rounds of injections, last one very recent, this that it has helped. Still having some soreness   with standing in 1 place, or sititng for too long. Also states  today she just twisted to get out of car, and has increased pain in L side of back, and down L leg(has had in the past )  L leg feels weak to her.  Other: L knee pain, meniscal tear, scope in may 2023. Reports less knee pain since surgery   Other: has had previous back pain in L side low bac/SI, travels down into L thigh and into L foot.   PERTINENT HISTORY: HTN, hx of left bunion surgery repair, L knee scope may 2023.  PAIN:  Are you having pain? Yes: NPRS scale: up to 6 /10 Pain location: bil hip pain,L>R.  Pain description:   sore,  Aggravating factors: R side painful with sleeping on it, sitting, standing/walking Relieving factors: ice and rest  PRECAUTIONS:  None  WEIGHT BEARING RESTRICTIONS No  FALLS:  Has patient fallen in last 6 months? No  LIVING ENVIRONMENT: Lives with: lives with their spouse Lives in: House/apartment Stairs: Yes: Internal: 12 steps; on left going up and External: 2 steps; none Has following equipment at home: Walker - 2 wheeled cane  OCCUPATION:   PLOF: Independent takes care of her dogs/cats. - used to walk up to a half mile  PATIENT GOALS :  decreased pain in hips, improve strength of L leg.    OBJECTIVE:   DIAGNOSTIC FINDINGS:   COGNITION:  Overall cognitive status: Within functional limits for tasks assessed     SENSATION: WFL  Edema:    PALPATION: R hip: mild tenderness at gr troch, no pain down into ITB L hip: moderate tenderness at gr troch, into lateral thigh.  R knee: mild limitation for full extension.  Tender/trigger point at L levator     LE Measurements Hip ROM: WFL bil  Hip Strength:  L flex: 4-/5,  abd: 4-/5,   R: 4/5 Knee: L : 4/5,  R: 5/5   GAIT:   TODAY'S TREATMENT:  04/26/22: Therapeutic Exercise: Aerobic:  Supine:   SLR with TA x 10 bil;  Bridging 2 x 8 ; clams GTB  with tA x 20;  TA with breathing x 15, education on achieving contraction. TA with supine march x 20;  Seated: Sit to stand 3 x 5;  Standing:  marching x 20,  hip abd 2 x 10 bil;  Step ups 6 in , no UE support x 10 bil;  Stretches: LTR x 15;  Neuromuscular Re-education: Manual Therapy: STM/DTM to L low back and glute ,   Therapeutic Activity: Self Care:   04/22/22: Therapeutic Exercise: Aerobic:  Supine:  Clams GTB with TA x 20; SLR with TA x 10 bil;  TA with breathing x 15, education on achieving contraction. TA with supine march x 15;  Seated: LAQ 2.5 lb x 20 bil;  Sit to stand x 5;  Standing: ; Stretches: Hip ER fallouts x 10;   LTR x 15; calf stretch on step x 3;  Neuromuscular Re-education: Manual Therapy: STM/DTM to L low back and glute , long leg distraction on L, (holding at knee)   Therapeutic Activity: Self Care:    PATIENT EDUCATION:  Education details: reviewed and updated HEP Person educated: Patient Education method: Explanation, Demonstration, and Handouts Education comprehension: verbalized understanding   HOME EXERCISE PROGRAM: [email or web address] previous/knee Access Code: [email or web address]  new/hips   ASSESSMENT:  CLINICAL IMPRESSION: 04/26/2022   Pt challenged with strengthening today, due to weakness and instability. Most challenged with bridging. Pt with improving pain levels this week. Will benefit from continued/progressive strengthening as  tolerated.   Eval: Pt presents with primary complaint of increased pain in bilateral hips. She has mild stiffness, and mild ROM limitations. She has tenderness in bil gr troch L>R. She has weakness in hip and glute muscles, and decreased strength overall in L LE vs R. She has decreased ability for full functional activities, and will benefit from skilled PT to improve deficits and pain.   OBJECTIVE IMPAIRMENTS Abnormal gait, decreased activity tolerance, decreased balance, decreased endurance, decreased knowledge of use of DME, decreased mobility, difficulty walking, decreased ROM, decreased strength, increased edema, improper body mechanics, and pain.   ACTIVITY LIMITATIONS meal prep, cleaning, driving, and community activity.   PERSONAL FACTORS Time since onset of injury/illness/exacerbation are also affecting patient's functional outcome.    REHAB POTENTIAL: Good  CLINICAL DECISION MAKING: Stable/uncomplicated  EVALUATION COMPLEXITY: Low   GOALS: Goals reviewed with patient?  yes  SHORT TERM GOALS:    date: 04/27/2022   Patient will be independent in self management strategies to improve quality of life and functional outcomes. Goal status: INITIAL  2.  Patient will report at least 50% improvement in overall symptoms and/or function to demonstrate improved functional mobility  Goal status: INITIAL   LONG  TERM GOALS:   date: 06/09/2022   Patient will report at least 75% improvement in overall symptoms and/or function to demonstrate improved functional mobility  Goal status: INITIAL  2.  Patient will be able to demonstrate at least 4+/5 MMT in L hip and LE,  to improve ability for stairs, gait and functional activity.   Goal status: INITIAL  3.  Pt to be independent with final HEP .     Goal status: INITIAL  4. Pt to report walking at least 1/2 mi, with pain 0-2/10 in hips, to improve ability for community activity.   Goal status: INITIAL     PLAN: PT FREQUENCY: 2x/week  PT DURATION: 8 weeks  PLANNED INTERVENTIONS: Therapeutic exercises, Therapeutic activity, Neuromuscular re-education, Balance training, Gait training, Patient/Family education, Joint manipulation, Joint mobilization, DME instructions, Aquatic Therapy, Dry Needling, Spinal mobilization, Cryotherapy, Moist heat, Ultrasound, Ionotophoresis 4mg /ml Dexamethasone, and Manual therapy  PLAN FOR NEXT SESSION:    Sedalia Muta, PT, DPT 1:22 PM  04/26/22   PHYSICAL THERAPY DISCHARGE SUMMARY  Visits from Start of Care: 5   Plan: Patient agrees to discharge.  Patient goals were partially met. Patient is being discharged due to - not returning since last visit.   Sedalia Muta, PT, DPT 3:05 PM  09/09/22

## 2022-04-29 ENCOUNTER — Encounter: Payer: Medicare Other | Admitting: Physical Therapy

## 2022-05-03 ENCOUNTER — Encounter: Payer: Medicare Other | Admitting: Physical Therapy

## 2022-05-06 ENCOUNTER — Encounter: Payer: Medicare Other | Admitting: Physical Therapy

## 2022-05-11 ENCOUNTER — Encounter: Payer: Medicare Other | Admitting: Physical Therapy

## 2022-05-12 ENCOUNTER — Ambulatory Visit: Payer: Medicare Other | Admitting: Family Medicine

## 2022-05-13 ENCOUNTER — Encounter: Payer: Medicare Other | Admitting: Physical Therapy

## 2022-05-24 ENCOUNTER — Encounter: Payer: Self-pay | Admitting: Family Medicine

## 2022-05-24 ENCOUNTER — Ambulatory Visit (INDEPENDENT_AMBULATORY_CARE_PROVIDER_SITE_OTHER): Payer: Medicare Other | Admitting: Family Medicine

## 2022-05-24 VITALS — BP 133/77 | HR 72 | Temp 98.2°F | Ht 64.0 in | Wt 139.4 lb

## 2022-05-24 DIAGNOSIS — K8689 Other specified diseases of pancreas: Secondary | ICD-10-CM | POA: Diagnosis not present

## 2022-05-24 DIAGNOSIS — M25551 Pain in right hip: Secondary | ICD-10-CM

## 2022-05-24 DIAGNOSIS — I10 Essential (primary) hypertension: Secondary | ICD-10-CM

## 2022-05-24 MED ORDER — CELECOXIB 200 MG PO CAPS: 200.0000 mg | ORAL_CAPSULE | Freq: Two times a day (BID) | ORAL | 0 refills | Status: DC

## 2022-05-24 MED ORDER — METHYLPREDNISOLONE ACETATE 80 MG/ML IJ SUSP
80.0000 mg | Freq: Once | INTRAMUSCULAR | Status: AC
  Administered 2022-05-24: 80 mg via INTRA_ARTICULAR

## 2022-05-24 MED ORDER — HYDROCHLOROTHIAZIDE 25 MG PO TABS: 12.5000 mg | ORAL_TABLET | Freq: Every day | ORAL | 1 refills | Status: DC

## 2022-05-24 NOTE — Progress Notes (Signed)
   Linda Knight is a 82 y.o. female who presents today for an office visit.  Assessment/Plan:  New/Acute Problems: Right Hip Pain  Exam and history consistent with greater trochanteric bursitis.  We discussed treatment options.  We discussed home exercise program and handout was given.  Will start Celebrex 200 mg twice daily for the next 1 to 2 weeks.  Start injection was performed today after discussion risk and benefits.  See below procedure note.  She tolerated well.  She will let me know if not proving in the next couple of weeks and would refer to PT and/or sports medicine at that time.  Chronic Problems Addressed Today: Essential hypertension Blood pressure at goal on current regimen amlodipine-benazepril 5-10 once daily and HCTZ 12.5 once daily.  Pancreatic insufficiency Following with GI.  Recently started on Creon which has helped some though she is still having frequent loose and watery bowel movements.  Advised her to call and schedule an appointment again for follow-up.     Subjective:  HPI:  PAtient here with right hip pain. This started about a month ago. Pain started suddenly when walking. Located on outer part of right hip. Worse when sitting down and when sitting up. . She is having some back pain as well. Pain is about the same the last few weeks.  No obvious injuries or precipitating events. Tried tylenol and biofreeze with some improvement. Tried taking celebrex but is not sure if it helped.   See A/p for status of chronic conditions.        Objective:  Physical Exam: BP 133/77   Pulse 72   Temp 98.2 F (36.8 C) (Temporal)   Ht 5\' 4"  (1.626 m)   Wt 139 lb 6.4 oz (63.2 kg)   SpO2 98%   BMI 23.93 kg/m   Gen: No acute distress, resting comfortably MSK: - Back: Nontender to palpation.  No deformities - Right Hip: Tender to palpation along right greater trochanter.  Worsened with external rotation.  Neurovascular intact distally. Neuro: Grossly normal,  moves all extremities Psych: Normal affect and thought content  Bursa Injection Procedure Note  Pre-operative Diagnosis: right Trochanteric bursitis  Post-operative Diagnosis: same  Indications: Diagnosis and treatment of symptomatic bursal effusion  Anesthesia: Topical Ethyl Chloride  Procedure Details   After a discussion of the risks and benefits with the patient (including the possibility that any manipulation of the bursa could introduce infection, worsening the current situation significantly), verbal consent was obtained for the procedure. The joint was prepped with Betadine.  Topical ethyl chloride was applied for anesthesia. A 22G needle was then used to inject a 3-1 combination of  107ml of1% lidocaine without epinephrine and 1 cc of 40 mg/cc of Depo-Medrol at the points of maximal tenderness.  Needle was withdrawn and sterile bandage was placed.  Complications:  None; patient tolerated the procedure well.       [name]. Jimmey Ralph, MD 05/24/2022 9:27 AM

## 2022-05-24 NOTE — Assessment & Plan Note (Signed)
Blood pressure at goal on current regimen amlodipine-benazepril 5-10 once daily and HCTZ 12.5 once daily.

## 2022-05-24 NOTE — Assessment & Plan Note (Signed)
Following with GI.  Recently started on Creon which has helped some though she is still having frequent loose and watery bowel movements.  Advised her to call and schedule an appointment again for follow-up.

## 2022-05-24 NOTE — Patient Instructions (Signed)
It was very nice to see you today!  You have bursitis in your hip.  Work on the exercises.  Take the Celebrex twice daily for the next 1 to 2 weeks.  We gave you a steroid shot today.  Let me know if not improving in the next couple of weeks.  Take care, Dr Jimmey Ralph  PLEASE NOTE:  If you had any lab tests, please let us know if you have not heard back within a few days. You may see your results on mychart before we have a chance to review them but we will give you a call once they are reviewed by [place].   If we ordered any referrals today, please let us know if you have not heard from their office within the next week.   If you had any urgent prescriptions sent in today, please check with the pharmacy within an hour of our visit to make sure the prescription was transmitted appropriately.   Please try these tips to maintain a healthy lifestyle:  Eat at least 3 REAL meals and 1-2 snacks per day.  Aim for no more than 5 hours between eating.  If you eat breakfast, please do so within one hour of getting up.   Each meal should contain half fruits/vegetables, one quarter protein, and one quarter carbs (no bigger than a computer mouse)  Cut down on sweet beverages. This includes juice, soda, and sweet tea.   Drink at least 1 glass of water with each meal and aim for at least 8 glasses per day  Exercise at least 150 minutes every week.

## 2022-06-17 ENCOUNTER — Ambulatory Visit (INDEPENDENT_AMBULATORY_CARE_PROVIDER_SITE_OTHER): Payer: Medicare Other | Admitting: Internal Medicine

## 2022-06-17 ENCOUNTER — Encounter: Payer: Self-pay | Admitting: Internal Medicine

## 2022-06-17 VITALS — BP 120/80 | HR 80 | Ht 64.0 in | Wt 141.0 lb

## 2022-06-17 DIAGNOSIS — K862 Cyst of pancreas: Secondary | ICD-10-CM

## 2022-06-17 DIAGNOSIS — N816 Rectocele: Secondary | ICD-10-CM | POA: Diagnosis not present

## 2022-06-17 DIAGNOSIS — R151 Fecal smearing: Secondary | ICD-10-CM | POA: Diagnosis not present

## 2022-06-17 DIAGNOSIS — K6289 Other specified diseases of anus and rectum: Secondary | ICD-10-CM

## 2022-06-17 DIAGNOSIS — K5909 Other constipation: Secondary | ICD-10-CM

## 2022-06-17 DIAGNOSIS — K8689 Other specified diseases of pancreas: Secondary | ICD-10-CM

## 2022-06-17 NOTE — Patient Instructions (Signed)
Please take one tablespoon of metamucil daily.  Please try to limit alcohol to one drink a day.  _______________________________________________________  If your blood pressure at your visit was 140/90 or greater, please contact your primary care physician to follow up on this.  _______________________________________________________  If you are age 81 or older, your body mass index should be between 23-30. Your Body mass index is 24.2 kg/m. If this is out of the aforementioned range listed, please consider follow up with your Primary Care Provider.  If you are age 86 or younger, your body mass index should be between 19-25. Your Body mass index is 24.2 kg/m. If this is out of the aformentioned range listed, please consider follow up with your Primary Care Provider.   ________________________________________________________  The [place] [facility] providers would like to encourage you to use [facility] to communicate with providers for non-urgent requests or questions.  Due to long hold times on the telephone, sending your provider a message by [facility] may be a faster and more efficient way to get a response.  Please allow 48 business hours for a response.  Please remember that this is for non-urgent requests.  _______________________________________________________  I appreciate the opportunity to care for you. Stan Head, MD, [facility]

## 2022-06-17 NOTE — Progress Notes (Signed)
Linda Knight 82 y.o. 1941-02-07 [id number]  Assessment & Plan:   Encounter Diagnoses  Name Primary?   Fecal smearing Yes   Chronic constipation    Pancreas cyst    Pancreatic insufficiency    I am trying to sort this out, history is variable and I think there may be memory disturbance.  I am going to leave her off her Creon at this point.  I recommended a tablespoon of Metamucil daily since MiraLAX seems to overcorrect and cause leakage of feces.  She will return in August for reassessment.  She does not use alcohol though we do not know what caused her pancreas problems there is some suspicion of that contributing so I told her to limit her cocktails to 1 a day and to try for less.  She did not have chronic pancreatitis changes on EUS in 2021.  Further plans pending clinical course.  81/21/2024 is her next appointment with me.  Subjective:  Pancreatic insufficiency    - recurrent symptoms after stopping Creon Pancreatic cyst and dilated pancreatic duct on recent CT and MRI    - normal pancreas on CT in [facility] in 2019    - Abnormal pancreas on CT 07/05/19    - Abnormal pancreas on MRI 08/10/19    - EGD/EUS 09/27/19:        - small 5-60mm pancreatic cysts (body and head). No solid lesions. Nl PD.     - MRI/MRCP 10/30/20: no change    - MRI/MRCP 11/24/21: overall stable History of altered bowel habits following colpopexy and bladder repair 10/21    - no improvement with Xifaxan x 14 days for possible SIBO Periampullary duodenal diverticulum Abnormal MR lumbar spine 2021 Elevated lipase 07/2019 Recent abdominal pain, resolved with dietary changes Daily alcohol use, with history of heavier use Glucose intolerance Chronic constipation treated with Miralax and magnesium Adenomyomatosis of the gallbladder fundus. Prior screening colonoscopy in [facility]  Former smoker  Chief Complaint: Leakage of feces  HPI 82 year old white woman with a history as outlined above,  previously a patient of Dr. Orvan Falconer.'s complicated history of mildly dilated pancreatic duct pancreatic cystic lesions though no chronic pancreatitis on EUS, treated with Creon.  She also has a complicated pelvic floor history with corrective surgery through urogynecology.  There is chronic constipation.  She feels like Creon made fecal leakage worse.  She gets constipated and takes MiraLAX and then she leaks into her underwear in between her cheeks.  Small amounts of brown feces.  This is bothersome.  She has Metamucil and Citrucel but does not sound like she is using them.  The histories from Dr. Orvan Falconer notes indicate she had gas and abdominal pain helped by Creon.  The patient does not recall this and when asked about testing of fecal elastase whether was on Creon or not she thinks it was off is not really clear.  She had a low fecal elastase once and then the subsequent measures were greater than 500.  I think she was on Creon then she was probably on a chronically assuming she was adherent but she does not seem to remember that.  She has stopped her Rance Muir and her SSRI.  She had an overactive bladder symptoms and Rance Muir was helpful.  Then she felt like she did not need it and still feels that way now that she is off for some time.  I have reviewed Dr. Orvan Falconer notes previous endoscopic and imaging evaluations and  Dr. Ashley Royalty last 2 notes from urogynecology Allergies  Allergen Reactions   Tape Rash   Current Meds  Medication Sig   amLODipine-benazepril (LOTREL) 5-10 MG capsule Take 1 capsule by mouth daily.   aspirin EC 81 MG tablet Take 81 mg by mouth daily. Swallow whole.   atorvastatin (LIPITOR) 20 MG tablet Take 1 tablet (20 mg total) by mouth daily.   conjugated estrogens (PREMARIN) vaginal cream Place 1 Applicatorful vaginally once a week.   hydrochlorothiazide (HYDRODIURIL) 25 MG tablet Take 0.5 tablets (12.5 mg total) by mouth daily.   levothyroxine (SYNTHROID) 50 MCG tablet Take 1  tablet (50 mcg total) by mouth daily.   POTASSIUM CHLORIDE PO Take by mouth.   zolpidem (AMBIEN CR) 12.5 MG CR tablet TAKE 1 TABLET(12.5 MG) BY MOUTH AT BEDTIME AS NEEDED FOR SLEEP   Past Medical History:  Diagnosis Date   Anemia    Arthritis    CAD (coronary artery disease)    High cholesterol    Hypertension    Pancreatic cyst    SCCA (squamous cell carcinoma) of skin 2019   Right Arm [facility])   Sleep apnea    Squamous cell carcinoma of skin 2019   Left Leg [facility])   Thyroid disease    Past Surgical History:  Procedure Laterality Date   ABDOMINAL HYSTERECTOMY     APPENDECTOMY     BIOPSY  09/27/2019   Procedure: BIOPSY;  Surgeon: Rachael Fee, MD;  Location: [facility];  Service: Endoscopy;;   BREAST EXCISIONAL BIOPSY Right    BUNIONECTOMY     carcinoma removed Right 09/2020   CYSTOCELE REPAIR     bladder repair   ESOPHAGOGASTRODUODENOSCOPY (EGD) WITH PROPOFOL N/A 09/27/2019   Procedure: ESOPHAGOGASTRODUODENOSCOPY (EGD) WITH PROPOFOL;  Surgeon: Rachael Fee, MD;  Location: [facility];  Service: Endoscopy;  Laterality: N/A;   EUS N/A 09/27/2019   Procedure: UPPER ENDOSCOPIC ULTRASOUND (EUS) RADIAL;  Surgeon: Rachael Fee, MD;  Location: [facility];  Service: Endoscopy;  Laterality: N/A;   KNEE ARTHROSCOPY  06/2021   TYMPANOPLASTY Left 06/2020   VAGINAL PROLAPSE REPAIR  12/07/2019   Social History   Social History Narrative   Moved from [place] July 2020   family history includes Heart disease in her brother; Hyperlipidemia in her mother; Hypertension in her mother; Lung disease in her father; Stroke in her mother.   Review of Systems As above  Objective:   Physical Exam BP 120/80   Pulse 80   Ht 5\' 4"  (1.626 m)   Wt 141 lb (64 kg)   BMI 24.20 kg/m  NAD  Linda Knight, CMA present.   Anoderm inspection revealed sl eryhtema Anal wink was absent Digital exam revealed decreased resting tone and voluntary squeeze. Mod rectocele  present. Simulated defecation with valsalva revealed appropriate abdominal contraction and descent.

## 2022-06-24 ENCOUNTER — Telehealth: Payer: Self-pay | Admitting: Internal Medicine

## 2022-06-24 NOTE — Telephone Encounter (Signed)
Spoke with patient & she says she would like to change providers. She met with Dr. Leone Payor at last OV & felt that "nothing was accomplished" and that "she couldn't relate to him." She does not have a specific provider in mind currently. Per nurse manager, patient will need to look online at our website as to which doctor she would prefer & then give [place] a call back with her choice, so that we can discuss future scheduling at that time. Patient has been advised of these recommendations. Confirmed that she does have the option to look at our website online. She verbalized all understanding, no further questions.

## 2022-06-24 NOTE — Telephone Encounter (Signed)
Patient sent a MyChart message after receiving her 09/29/22 appointment with Dr. Leone Payor requesting she be referred to another provider.  There was no further explanation.  Please advise.

## 2022-07-13 ENCOUNTER — Other Ambulatory Visit: Payer: Self-pay | Admitting: Family Medicine

## 2022-07-13 DIAGNOSIS — K8689 Other specified diseases of pancreas: Secondary | ICD-10-CM

## 2022-07-21 ENCOUNTER — Other Ambulatory Visit: Payer: Self-pay | Admitting: Family Medicine

## 2022-07-22 ENCOUNTER — Encounter: Payer: Self-pay | Admitting: Family Medicine

## 2022-07-22 ENCOUNTER — Ambulatory Visit (INDEPENDENT_AMBULATORY_CARE_PROVIDER_SITE_OTHER): Payer: Medicare Other | Admitting: Family Medicine

## 2022-07-22 VITALS — BP 132/80 | HR 83 | Temp 97.3°F | Ht 64.0 in

## 2022-07-22 DIAGNOSIS — M1990 Unspecified osteoarthritis, unspecified site: Secondary | ICD-10-CM

## 2022-07-22 DIAGNOSIS — I10 Essential (primary) hypertension: Secondary | ICD-10-CM

## 2022-07-22 MED ORDER — ZOLPIDEM TARTRATE ER 6.25 MG PO TBCR: 12.5000 mg | EXTENDED_RELEASE_TABLET | Freq: Every evening | ORAL | 5 refills | Status: DC | PRN

## 2022-07-22 MED ORDER — HYDROCODONE-ACETAMINOPHEN 5-325 MG PO TABS: 1.0000 | ORAL_TABLET | Freq: Four times a day (QID) | ORAL | 0 refills | Status: DC | PRN

## 2022-07-22 MED ORDER — METHYLPREDNISOLONE ACETATE 80 MG/ML IJ SUSP
80.0000 mg | Freq: Once | INTRAMUSCULAR | Status: AC
  Administered 2022-07-22: 80 mg via INTRA_ARTICULAR

## 2022-07-22 NOTE — Assessment & Plan Note (Addendum)
Mild flare related to recent increased activity.  Reassuring exam today.  Steroid injection was performed today.  See tolerated well.  See below procedure note.  We discussed care after.  Will give small supply of Norco - she has tolerated well in the past. She can continue ice and compression.  She can follow back up with sports medicine orthopedics if this continues to be an issue.  We discussed reasons return to care or seek emergent care.

## 2022-07-22 NOTE — Progress Notes (Signed)
   Linda Knight is a 82 y.o. female who presents today for an office visit.  Assessment/Plan:  Chronic Problems Addressed Today: Osteoarthritis Mild flare related to recent increased activity.  Reassuring exam today.  Steroid injection was performed today.  See tolerated well.  See below procedure note.  We discussed care after.  Will give small supply of Norco - she has tolerated well in the past. She can continue ice and compression.  She can follow back up with sports medicine orthopedics if this continues to be an issue.  We discussed reasons return to care or seek emergent care.  Essential hypertension Initially elevated but at goal on recheck. Continue HCTZ 12.5 mg daily and amlodipine-benazepril 5-10 once daily.     Subjective:  HPI:  See A/P for status of chronic conditions.  Patient is here today with left knee pain.  This started several days ago. Did a trip to [place] where she was at a flea market and did a lot of loading and packing. She did have some mild swelling initially but over the last few days this has improved. Still has a lot of pain. This is impacting her ability to perform ADLs.  She would like to have steroid injection today.  She has done well with this in the past.       Objective:  Physical Exam: BP 132/80   Pulse 83   Temp (!) 97.3 F (36.3 C) (Temporal)   Ht 5\' 4"  (1.626 m)   SpO2 100%   BMI 24.20 kg/m   Gen: No acute distress, resting comfortably MUSCULOSKELETAL: - Left Knee: There is slight effusion noted.  Tenderness to palpation along bilateral joint line.  Stable to varus and valgus stress.  Anterior and posterior drawer signs negative.  Neurovascular intact distally. Neuro: Grossly normal, moves all extremities Psych: Normal affect and thought content  Knee Injection Procedure Note  Indication: Symptom relief of left Knee Pain.  Procedure Details  Verbal consent was obtained for the procedure. The joint was prepped with Betadine. Topical  ethyl chloride was applied for anesthesia. A 22 gauge needle was inserted into the medial aspect of the joint.  3 ml 1% lidocaine and 1 ml of 40mg /cc Depo-Medrol was then injected into the joint. The needle was removed and the area cleansed and dressed.  Complications:  None; patient tolerated the procedure well.       [name]. Jimmey Ralph, MD 07/22/2022 9:34 AM

## 2022-07-22 NOTE — Assessment & Plan Note (Addendum)
Initially elevated but at goal on recheck. Continue HCTZ 12.5 mg daily and amlodipine-benazepril 5-10 once daily.

## 2022-07-22 NOTE — Patient Instructions (Addendum)
It was very nice to see you today!  We injected your knee today.  Please continue using ice and compression on the area.Will also send in some pain medications to help with the pain.  Let us know if not improving.  I will refill your Ambien.  Return if symptoms worsen or fail to improve.   Take care, Dr Jimmey Ralph  PLEASE NOTE:  If you had any lab tests, please let us know if you have not heard back within a few days. You may see your results on mychart before we have a chance to review them but we will give you a call once they are reviewed by [place].   If we ordered any referrals today, please let us know if you have not heard from their office within the next week.   If you had any urgent prescriptions sent in today, please check with the pharmacy within an hour of our visit to make sure the prescription was transmitted appropriately.   Please try these tips to maintain a healthy lifestyle:  Eat at least 3 REAL meals and 1-2 snacks per day.  Aim for no more than 5 hours between eating.  If you eat breakfast, please do so within one hour of getting up.   Each meal should contain half fruits/vegetables, one quarter protein, and one quarter carbs (no bigger than a computer mouse)  Cut down on sweet beverages. This includes juice, soda, and sweet tea.   Drink at least 1 glass of water with each meal and aim for at least 8 glasses per day  Exercise at least 150 minutes every week.

## 2022-07-26 ENCOUNTER — Ambulatory Visit: Payer: Medicare Other | Admitting: Nurse Practitioner

## 2022-07-29 ENCOUNTER — Ambulatory Visit
Admission: RE | Admit: 2022-07-29 | Discharge: 2022-07-29 | Disposition: A | Discharge status: Home / Self Care | Payer: Medicare Other | Source: Ambulatory Visit | Attending: Family Medicine | Admitting: Family Medicine

## 2022-07-29 ENCOUNTER — Other Ambulatory Visit: Payer: Self-pay | Admitting: Family Medicine

## 2022-07-29 DIAGNOSIS — Z1231 Encounter for screening mammogram for malignant neoplasm of breast: Secondary | ICD-10-CM

## 2022-08-04 ENCOUNTER — Ambulatory Visit (INDEPENDENT_AMBULATORY_CARE_PROVIDER_SITE_OTHER): Payer: Medicare Other | Admitting: Family

## 2022-08-04 ENCOUNTER — Encounter: Payer: Self-pay | Admitting: Family

## 2022-08-04 VITALS — BP 137/86 | HR 100 | Temp 97.8°F | Ht 64.0 in | Wt 135.4 lb

## 2022-08-04 DIAGNOSIS — R110 Nausea: Secondary | ICD-10-CM | POA: Diagnosis not present

## 2022-08-04 DIAGNOSIS — I499 Cardiac arrhythmia, unspecified: Secondary | ICD-10-CM | POA: Diagnosis not present

## 2022-08-04 DIAGNOSIS — R42 Dizziness and giddiness: Secondary | ICD-10-CM

## 2022-08-04 MED ORDER — MECLIZINE HCL 12.5 MG PO TABS
12.5000 mg | ORAL_TABLET | Freq: Three times a day (TID) | ORAL | 0 refills | Status: DC | PRN
Start: 2022-08-04 — End: 2023-01-12

## 2022-08-04 MED ORDER — ONDANSETRON 4 MG PO TBDP
4.0000 mg | ORAL_TABLET | Freq: Three times a day (TID) | ORAL | 0 refills | Status: DC | PRN
Start: 2022-08-04 — End: 2022-08-09

## 2022-08-04 NOTE — Progress Notes (Unsigned)
Patient ID: Linda Knight, female    DOB: 1940/10/15, 82 y.o.   MRN: [id number]  Chief Complaint  Patient presents with   Dizziness    Pt c/o dizziness and N/V since 6/22, Pt states she has been nauseated every morning since.    HPI:      Dizziness/Nausea:  sx started on 6/22, denies eating any bad food, denies any bowel changes, reports also having rapid heart rate in 90s which is higher than usual. Reports waking with sx every morning, she reports laying down and sx get a little better around lunch but never completely resolve. Denies feeling any skipped beats or palpitations, but was told in past she had some skipped beats, but not concerning at the time. No changes in hearing, and has always had a little tinnitus, but no worse in the last few days.      Assessment & Plan:     Subjective:    Outpatient Medications Prior to Visit  Medication Sig Dispense Refill   amLODipine-benazepril (LOTREL) 5-10 MG capsule TAKE 1 CAPSULE BY MOUTH EVERY DAY 90 capsule 1   aspirin EC 81 MG tablet Take 81 mg by mouth daily. Swallow whole.     atorvastatin (LIPITOR) 20 MG tablet Take 1 tablet (20 mg total) by mouth daily. 90 tablet 2   celecoxib (CELEBREX) 200 MG capsule Take 1 capsule (200 mg total) by mouth 2 (two) times daily. 60 capsule 0   citalopram (CELEXA) 10 MG tablet TAKE 1 TABLET DAILY 90 tablet 3   conjugated estrogens (PREMARIN) vaginal cream Place 1 Applicatorful vaginally once a week. 42.5 g 12   GEMTESA 75 MG TABS Take 1 tablet by mouth daily.     hydrochlorothiazide (HYDRODIURIL) 25 MG tablet Take 0.5 tablets (12.5 mg total) by mouth daily. 90 tablet 1   HYDROcodone-acetaminophen (NORCO) 5-325 MG tablet Take 1 tablet by mouth every 6 (six) hours as needed for moderate pain. 10 tablet 0   levothyroxine (SYNTHROID) 50 MCG tablet Take 1 tablet (50 mcg total) by mouth daily. 90 tablet 3   POTASSIUM CHLORIDE PO Take by mouth.     zolpidem (AMBIEN CR) 6.25 MG CR tablet Take 2  tablets (12.5 mg total) by mouth at bedtime as needed for sleep. 60 tablet 5   azelastine (ASTELIN) 0.1 % nasal spray Place 2 sprays into both nostrils 2 (two) times daily. 54 mL 0   No facility-administered medications prior to visit.   Past Medical History:  Diagnosis Date   Anemia    Arthritis    CAD (coronary artery disease)    High cholesterol    Hypertension    Pancreatic cyst    SCCA (squamous cell carcinoma) of skin 2019   Right Arm [facility])   Sleep apnea    Squamous cell carcinoma of skin 2019   Left Leg [facility])   Thyroid disease    Past Surgical History:  Procedure Laterality Date   ABDOMINAL HYSTERECTOMY     APPENDECTOMY     BIOPSY  09/27/2019   Procedure: BIOPSY;  Surgeon: Rachael Fee, MD;  [facility];  Service: Endoscopy;;   BREAST EXCISIONAL BIOPSY Right    BUNIONECTOMY     carcinoma removed Right 09/2020   CYSTOCELE REPAIR     bladder repair   ESOPHAGOGASTRODUODENOSCOPY (EGD) WITH PROPOFOL N/A 09/27/2019   Procedure: ESOPHAGOGASTRODUODENOSCOPY (EGD) WITH PROPOFOL;  Surgeon: Rachael Fee, MD;  Location[facility];  Service: Endoscopy;  Laterality: N/A;  EUS N/A 09/27/2019   Procedure: UPPER ENDOSCOPIC ULTRASOUND (EUS) RADIAL;  Surgeon: Rachael Fee, MD;  Location[facility];  Service: Endoscopy;  Laterality: N/A;   KNEE ARTHROSCOPY  06/2021   TYMPANOPLASTY Left 06/2020   VAGINAL PROLAPSE REPAIR  12/07/2019   Allergies  Allergen Reactions   Tape Rash      Objective:    Physical Exam Vitals and nursing note reviewed.  Constitutional:      Appearance: Normal appearance.  Cardiovascular:     Rate and Rhythm: Normal rate and regular rhythm.  Pulmonary:     Effort: Pulmonary effort is normal.     Breath sounds: Normal breath sounds.  Musculoskeletal:        General: Normal range of motion.  Skin:    General: Skin is warm and dry.  Neurological:     Mental Status: She is alert.  Psychiatric:        Mood and  Affect: Mood normal.        Behavior: Behavior normal.    BP 137/86   Pulse 100   Temp 97.8 F (36.6 C) (Temporal)   Ht 5\' 4"  (1.626 m)   Wt 135 lb 6 oz (61.4 kg)   SpO2 99%   BMI 23.24 kg/m  Wt Readings from Last 3 Encounters:  08/04/22 135 lb 6 oz (61.4 kg)  06/17/22 141 lb (64 kg)  05/24/22 139 lb 6.4 oz (63.2 kg)       Dulce Sellar, NP

## 2022-08-04 NOTE — Patient Instructions (Addendum)
It was very nice to see you today!   I will review your lab results via MyChart in a few days.  I have sent medications to your pharmacy to help with your nausea and another for the dizziness, but wait to take this until after you see Cardiology (Meclizine).  I am sending a referral to our cardiology office in [place] to further assess your heart rhythm that I think is causing your symptoms.  They will call you directly to schedule an appointment      PLEASE NOTE:  If you had any lab tests please let us know if you have not heard back within a few days. You may see your results on MyChart before we have a chance to review them but we will give you a call once they are reviewed by [place]. If we ordered any referrals today, please let us know if you have not heard from their office within the next week.

## 2022-08-05 ENCOUNTER — Other Ambulatory Visit (HOSPITAL_COMMUNITY): Payer: Self-pay

## 2022-08-05 ENCOUNTER — Encounter (HOSPITAL_COMMUNITY): Payer: Self-pay | Admitting: Emergency Medicine

## 2022-08-05 ENCOUNTER — Emergency Department (HOSPITAL_COMMUNITY)
Admission: EM | Admit: 2022-08-05 | Discharge: 2022-08-05 | Disposition: A | Discharge status: Home / Self Care | Payer: Medicare Other | Attending: Emergency Medicine | Admitting: Emergency Medicine

## 2022-08-05 ENCOUNTER — Other Ambulatory Visit: Payer: Self-pay

## 2022-08-05 ENCOUNTER — Other Ambulatory Visit: Payer: Self-pay | Admitting: Family

## 2022-08-05 ENCOUNTER — Emergency Department (HOSPITAL_COMMUNITY): Payer: Medicare Other

## 2022-08-05 DIAGNOSIS — I480 Paroxysmal atrial fibrillation: Secondary | ICD-10-CM

## 2022-08-05 DIAGNOSIS — I4891 Unspecified atrial fibrillation: Secondary | ICD-10-CM | POA: Diagnosis not present

## 2022-08-05 DIAGNOSIS — I10 Essential (primary) hypertension: Secondary | ICD-10-CM | POA: Insufficient documentation

## 2022-08-05 DIAGNOSIS — Z7982 Long term (current) use of aspirin: Secondary | ICD-10-CM | POA: Insufficient documentation

## 2022-08-05 DIAGNOSIS — Z79899 Other long term (current) drug therapy: Secondary | ICD-10-CM | POA: Insufficient documentation

## 2022-08-05 DIAGNOSIS — R002 Palpitations: Secondary | ICD-10-CM | POA: Diagnosis present

## 2022-08-05 LAB — BASIC METABOLIC PANEL
Anion gap: 11 (ref 5–15)
BUN: 20 mg/dL (ref 6–23)
BUN: 19 mg/dL (ref 8–23)
CO2: 30 mEq/L (ref 19–32)
CO2: 28 mmol/L (ref 22–32)
Calcium: 9.9 mg/dL (ref 8.4–10.5)
Calcium: 9.5 mg/dL (ref 8.9–10.3)
Chloride: 88 mEq/L — ABNORMAL LOW (ref 96–112)
Chloride: 91 mmol/L — ABNORMAL LOW (ref 98–111)
Creatinine, Ser: 1.19 mg/dL (ref 0.40–1.20)
Creatinine, Ser: 0.99 mg/dL (ref 0.44–1.00)
GFR, Estimated: 58 mL/min — ABNORMAL LOW (ref >60)
GFR: 43.18 mL/min — ABNORMAL LOW (ref >60.00)
Glucose, Bld: 99 mg/dL (ref 70–99)
Glucose, Bld: 104 mg/dL — ABNORMAL HIGH (ref 70–99)
Potassium: 2.9 mEq/L — ABNORMAL LOW (ref 3.5–5.1)
Potassium: 3.7 mmol/L (ref 3.5–5.1)
Sodium: 130 mEq/L — ABNORMAL LOW (ref 135–145)
Sodium: 130 mmol/L — ABNORMAL LOW (ref 135–145)

## 2022-08-05 LAB — MAGNESIUM: Magnesium: 1.8 mg/dL (ref 1.7–2.4)

## 2022-08-05 LAB — CBC
HCT: 34.4 % — ABNORMAL LOW (ref 36.0–46.0)
Hemoglobin: 12.3 g/dL (ref 12.0–15.0)
MCH: 35.2 pg — ABNORMAL HIGH (ref 26.0–34.0)
MCHC: 35.8 g/dL (ref 30.0–36.0)
MCV: 98.6 fL (ref 80.0–100.0)
Platelets: 309 10*3/uL (ref 150–400)
RBC: 3.49 MIL/uL — ABNORMAL LOW (ref 3.87–5.11)
RDW: 11.9 % (ref 11.5–15.5)
WBC: 4.6 10*3/uL (ref 4.0–10.5)
nRBC: 0 % (ref 0.0–0.2)

## 2022-08-05 LAB — TROPONIN I (HIGH SENSITIVITY): Troponin I (High Sensitivity): 8 ng/L (ref <18)

## 2022-08-05 MED ORDER — APIXABAN 5 MG PO TABS
5.0000 mg | ORAL_TABLET | Freq: Two times a day (BID) | ORAL | Status: DC
  Administered 2022-08-05: 5 mg via ORAL
  Filled 2022-08-05: qty 1

## 2022-08-05 MED ORDER — DILTIAZEM HCL-DEXTROSE 125-5 MG/125ML-% IV SOLN (PREMIX)
5.0000 mg/h | INTRAVENOUS | Status: DC
  Filled 2022-08-05: qty 125

## 2022-08-05 MED ORDER — APIXABAN 5 MG PO TABS: 5.0000 mg | ORAL_TABLET | Freq: Two times a day (BID) | ORAL | 0 refills | Status: DC

## 2022-08-05 MED ORDER — METOPROLOL TARTRATE 25 MG PO TABS
12.5000 mg | ORAL_TABLET | Freq: Once | ORAL | Status: AC
  Administered 2022-08-05: 12.5 mg via ORAL
  Filled 2022-08-05: qty 1

## 2022-08-05 MED ORDER — SODIUM CHLORIDE 0.9 % IV BOLUS
500.0000 mL | Freq: Once | INTRAVENOUS | Status: AC
  Administered 2022-08-05: 500 mL via INTRAVENOUS

## 2022-08-05 MED ORDER — METOPROLOL TARTRATE 25 MG PO TABS: 12.5000 mg | ORAL_TABLET | Freq: Two times a day (BID) | ORAL | 0 refills | Status: DC

## 2022-08-05 MED ORDER — APIXABAN 2.5 MG PO TABS: 2.5000 mg | ORAL_TABLET | Freq: Two times a day (BID) | ORAL | 0 refills | Status: DC

## 2022-08-05 NOTE — ED Provider Notes (Signed)
[place] EMERGENCY DEPARTMENT AT [facility] Provider Note   CSN: [id number] Arrival date & time: 08/05/22  0272     History  Chief Complaint  Patient presents with   Palpitations    Linda Knight is a 82 y.o. female.  Past medical history of hypertension, arthritis, presents the ER today for palpitations that started this morning.  Describes this as a fast heartbeat that she could feel going into her neck.  Having dizziness feeling lightheaded for the past 4 to 5 days, denies any chest pain, no shortness of breath.  She does have nausea but has no vomiting, no lower extremity swelling.  No syncope.  Was seen by her PCP yesterday and given prescription for Zofran and referral to cardiology   Palpitations      Home Medications Prior to Admission medications   Medication Sig Start Date End Date Taking? Authorizing Provider  amLODipine-benazepril (LOTREL) 5-10 MG capsule TAKE 1 CAPSULE BY MOUTH EVERY DAY 07/21/22   Ardith Dark, MD  aspirin EC 81 MG tablet Take 81 mg by mouth daily. Swallow whole.    [provider]  atorvastatin (LIPITOR) 20 MG tablet Take 1 tablet (20 mg total) by mouth daily. 02/09/22   Ardith Dark, MD  azelastine (ASTELIN) 0.1 % nasal spray Place 2 sprays into both nostrils 2 (two) times daily. 06/03/21 05/24/22  Ardith Dark, MD  celecoxib (CELEBREX) 200 MG capsule Take 1 capsule (200 mg total) by mouth 2 (two) times daily. 05/24/22   Ardith Dark, MD  citalopram (CELEXA) 10 MG tablet TAKE 1 TABLET DAILY 11/04/21   Ardith Dark, MD  conjugated estrogens (PREMARIN) vaginal cream Place 1 Applicatorful vaginally once a week. 06/15/21   Ardith Dark, MD  GEMTESA 75 MG TABS Take 1 tablet by mouth daily. 01/14/22   [provider]  hydrochlorothiazide (HYDRODIURIL) 25 MG tablet Take 0.5 tablets (12.5 mg total) by mouth daily. 05/24/22   Ardith Dark, MD  HYDROcodone-acetaminophen (NORCO) 5-325 MG tablet Take 1 tablet by  mouth every 6 (six) hours as needed for moderate pain. 07/22/22   Ardith Dark, MD  levothyroxine (SYNTHROID) 50 MCG tablet Take 1 tablet (50 mcg total) by mouth daily. 12/10/21   Ardith Dark, MD  meclizine (ANTIVERT) 12.5 MG tablet Take 1 tablet (12.5 mg total) by mouth 3 (three) times daily as needed for dizziness or nausea. 08/04/22   Dulce Sellar, NP  ondansetron (ZOFRAN-ODT) 4 MG disintegrating tablet Take 1 tablet (4 mg total) by mouth every 8 (eight) hours as needed for nausea or vomiting. 08/04/22   Dulce Sellar, NP  POTASSIUM CHLORIDE PO Take by mouth.    [provider]  zolpidem (AMBIEN CR) 6.25 MG CR tablet Take 2 tablets (12.5 mg total) by mouth at bedtime as needed for sleep. 07/22/22   Ardith Dark, MD      Allergies    Tape    Review of Systems   Review of Systems  Cardiovascular:  Positive for palpitations.    Physical Exam Updated Vital Signs BP (!) 150/91 (BP Location: Right Arm)   Pulse 100   Temp 97.7 F (36.5 C) (Oral)   Resp 17   Ht 5\' 4"  (1.626 m)   Wt 63 kg   SpO2 99%   BMI 23.86 kg/m  Physical Exam Vitals and nursing note reviewed.  Constitutional:      General: She is not in acute distress.  Appearance: She is well-developed.  HENT:     Head: Normocephalic and atraumatic.     Mouth/Throat:     Mouth: Mucous membranes are moist.  Eyes:     Conjunctiva/sclera: Conjunctivae normal.  Cardiovascular:     Rate and Rhythm: Normal rate and regular rhythm.     Heart sounds: No murmur heard. Pulmonary:     Effort: Pulmonary effort is normal. No respiratory distress.     Breath sounds: Normal breath sounds.  Abdominal:     Palpations: Abdomen is soft.     Tenderness: There is no abdominal tenderness.  Musculoskeletal:        General: No swelling.     Cervical back: Neck supple.     Right lower leg: No edema.     Left lower leg: No edema.  Skin:    General: Skin is warm and dry.     Capillary Refill: Capillary refill  takes less than 2 seconds.  Neurological:     General: No focal deficit present.     Mental Status: She is alert and oriented to person, place, and time.  Psychiatric:        Mood and Affect: Mood normal.     ED Results / Procedures / Treatments   Labs (all labs ordered are listed, but only abnormal results are displayed) Labs Reviewed - No data to display  EKG None  Radiology No results found.  Procedures Procedures    Medications Ordered in ED Medications - No data to display  ED Course/ Medical Decision Making/ A&P                             Medical Decision Making This patient presents to the ED for concern of palpitations and  dizziness, this involves an extensive number of treatment options, and is a complaint that carries with it a high risk of complications and morbidity.  The differential diagnosis includes anemia, A-fib, V. tach, dehydration, sepsis, PVCs, PACs, other   Co morbidities that complicate the patient evaluation :   Hypertension, high cholesterol, arthritis   Additional history obtained:  Additional history obtained from EMR, External records from outside source obtained and reviewed including notes from this visit, outpatient notes and labs,  PCP note and labs from yesterday   Lab Tests:  I Ordered, and personally interpreted labs.  The pertinent results include:  BMP shows mild hyponatremia, normal K and mag; CBC is reassuring no anemia or leukocytosis  Trop negative  Imaging Studies ordered:  I ordered imaging studies including Chest Xray  I independently visualized and interpreted imaging which showed no pulmonary edema or infiltrate I agree with the radiologist interpretation   Cardiac Monitoring: / EKG:  The patient was maintained on a cardiac monitor.  I personally viewed and interpreted the cardiac monitored which showed an underlying rhythm of: Fibrillation rate 102-114 converted to sinus rhythm rate of 82   Consultations  Obtained:  I requested consultation with the cardiologist,  and discussed lab and imaging findings as well as pertinent plan - they recommend: Metoprolol and Eliquis, follow-up outpatient Monday   Problem List / ED Course / Critical interventions / Medication management  Palpitations-patient in A-fib with RVR, symptomatic with rates up to 114 on monitor when I was evaluating her, she spontaneously converted.  She is feeling much better, desires to go home, she has a cardiology follow-up on Monday.  I discussed with Dr. Brett Canales agreeable with  outpatient follow-up suggested metoprolol and anticoagulation based on patient's CHA2DS2-VASc.  Patient is high risk on her CHA2DS2-VASc and needs anticoagulation, discussed risks and benefits of this with patient and she is agreeable with this.  Discussed increased risk of bleeding.  HASBLED score is 0, she is no longer taking the Celebrex and discussed to avoid NSAIDs.   Full fluid bolus, some of her symptoms may be due to dehydration, sodium is slightly low, emesis, decreased p.o. intake, advised to maintain hydration at home.  No history of CHF, no signs of volume overload on exam or chest x-ray I ordered medication including metoprolol  for rate control  Reevaluation of the patient after these medicines showed that the patient resolved I have reviewed the patients home medicines and have made adjustments as needed       Amount and/or Complexity of Data Reviewed Labs: ordered. Radiology: ordered.  Risk Prescription drug management.           Final Clinical Impression(s) / ED Diagnoses Final diagnoses:  None    Rx / DC Orders ED Discharge Orders     None         Ma Rings, PA-C 08/05/22 1201    Vanetta Mulders, MD 08/07/22 1404

## 2022-08-05 NOTE — Progress Notes (Signed)
Potassium too low, requires supervised replacement in ER, per EMR notes appears pt had to go to ED last night. Please call and check on or leave message we will follow her care in the ED. Thanks.

## 2022-08-05 NOTE — ED Notes (Signed)
EKG given to MD

## 2022-08-05 NOTE — ED Triage Notes (Signed)
Pt arrived via [facility] from home c/o heart palpitations since the 22nd of this month, she went tp her PCP and they told her to follow up with cardiology; However, Sx gotten worse as she also c/o nausea and dizziness when sitting and standing. Per EMS, there was a 16-20 point difference in BP when sitting and standing. Pt took zofran at home this morning. When asked about chest pain, pt states "it just felt heavy, like something was sitting on it this morning" denies SOB

## 2022-08-05 NOTE — Discharge Instructions (Addendum)
You are taking care of you today.  When your heart was beating fast it was irregular, you were having a rhythm called atrial fibrillation.  As we discussed this can increase risk for stroke.  Your heart then converted back to a normal rhythm while you are in the ER.  We are going to start you on a medication to help your heart rate and a blood thinner to decrease your risk of stroke.  As discussed avoid medicine such as aspirin, Aleve, ibuprofen and Celebrex as these can increase your risk of abnormal bleeding, come back to the ER if you have any new or worsening symptoms.  Keep your appointment for Monday with a cardiologist.  Information on my medicine - ELIQUIS (apixaban)   Why was Eliquis prescribed for you? Eliquis was prescribed for you to reduce the risk of a blood clot forming that can cause a stroke if you have a medical condition called atrial fibrillation (a type of irregular heartbeat).  What do You need to know about Eliquis ? Take your Eliquis TWICE DAILY - one tablet in the morning and one tablet in the evening with or without food. If you have difficulty swallowing the tablet whole please discuss with your pharmacist how to take the medication safely.  Take Eliquis exactly as prescribed by your doctor and DO NOT stop taking Eliquis without talking to the doctor who prescribed the medication.  Stopping may increase your risk of developing a stroke.  Refill your prescription before you run out.  After discharge, you should have regular check-up appointments with your healthcare provider that is prescribing your Eliquis.  In the future your dose may need to be changed if your kidney function or weight changes by a significant amount or as you get older.  What do you do if you miss a dose? If you miss a dose, take it as soon as you remember on the same day and resume taking twice daily.  Do not take more than one dose of ELIQUIS at the same time to make up a missed  dose.  Important Safety Information A possible side effect of Eliquis is bleeding. You should call your healthcare provider right away if you experience any of the following: Bleeding from an injury or your nose that does not stop. Unusual colored urine (red or dark brown) or unusual colored stools (red or black). Unusual bruising for unknown reasons. A serious fall or if you hit your head (even if there is no bleeding).  Some medicines may interact with Eliquis and might increase your risk of bleeding or clotting while on Eliquis. To help avoid this, consult your healthcare provider or pharmacist prior to using any new prescription or non-prescription medications, including herbals, vitamins, non-steroidal anti-inflammatory drugs (NSAIDs) and supplements.  This website has more information on Eliquis (apixaban): http://www.eliquis.com/eliquis/home

## 2022-08-05 NOTE — ED Notes (Signed)
Second EKG given to MD after pt naturally converted back to NSR

## 2022-08-05 NOTE — Progress Notes (Signed)
Per ED notes, her potassium was already back up to normal, but her sodium level was still a little low - she mentioned she had some potassium at home did she take any after seeing me? I see they started her on 2 new meds for her heart, hopefully she picked these up and started them? They will help prevent the palpitations and remind her to keep her appt on Monday, thx & let me know what she says

## 2022-08-06 ENCOUNTER — Telehealth: Payer: Self-pay

## 2022-08-06 NOTE — Telephone Encounter (Signed)
I called pt in regards, pt gave a verbalized understanding.

## 2022-08-06 NOTE — Telephone Encounter (Signed)
-----   Message from Dulce Sellar, NP sent at 08/05/2022  9:33 PM EDT ----- Regarding: add-on message see previous msg -  please also tell Linda Knight to hold the Hydrochlorothiazide until she sees cardiology on Monday. This can lower both her sodium & potassium and it is not needed for her blood pressure. If she notices her ankles start swelling a little, cardiology may prescribe a safer diuretic. thx

## 2022-08-09 ENCOUNTER — Ambulatory Visit: Payer: Medicare Other | Attending: Internal Medicine | Admitting: Internal Medicine

## 2022-08-09 ENCOUNTER — Ambulatory Visit: Payer: Medicare Other | Attending: Internal Medicine

## 2022-08-09 ENCOUNTER — Encounter: Payer: Self-pay | Admitting: Internal Medicine

## 2022-08-09 VITALS — BP 124/78 | HR 58 | Ht 64.0 in | Wt 138.2 lb

## 2022-08-09 DIAGNOSIS — I4719 Other supraventricular tachycardia: Secondary | ICD-10-CM

## 2022-08-09 DIAGNOSIS — I491 Atrial premature depolarization: Secondary | ICD-10-CM

## 2022-08-09 DIAGNOSIS — E876 Hypokalemia: Secondary | ICD-10-CM | POA: Diagnosis present

## 2022-08-09 DIAGNOSIS — I10 Essential (primary) hypertension: Secondary | ICD-10-CM | POA: Diagnosis present

## 2022-08-09 DIAGNOSIS — Z79899 Other long term (current) drug therapy: Secondary | ICD-10-CM

## 2022-08-09 MED ORDER — POTASSIUM CHLORIDE CRYS ER 20 MEQ PO TBCR: 20.0000 meq | EXTENDED_RELEASE_TABLET | Freq: Two times a day (BID) | ORAL | 3 refills | Status: DC

## 2022-08-09 NOTE — Patient Instructions (Signed)
Medication Instructions:  STOP Eliquis   START Potassium 20 meq twice a day   Labwork: BMET in 5 days  Testing/Procedures: ZIO XT- Long Term Monitor Instructions   Your physician has requested you wear your ZIO patch monitor____14___days.   This is a single patch monitor.  [employer] supplies one patch monitor per enrollment.  Additional stickers are not available.   Do not shower for the first 24 hours.  You may shower after the first 24 hours.   Press button if you feel a symptom. You will hear a small click.  Record Date, Time and Symptom in the Patient Log Book.   When you are ready to remove patch, follow instructions on last 2 pages of Patient Log Book.  Stick patch monitor onto last page of Patient Log Book.   Place Patient Log Book in [place] box.  Use locking tab on box and tape box closed securely.  The [employer] and [employer] has [employer] on it.  Please place in mailbox as soon as possible.  Your physician should have your test results approximately 7 days after the monitor has been mailed back to [facility].   Call [facility] Customer Care at [phone] if you have questions regarding your ZIO XT patch monitor.  Call them immediately if you see an orange light blinking on your monitor.   If your monitor falls off in less than 4 days contact our Monitor department at [phone].  If your monitor becomes loose or falls off after 4 days call [employer] at [phone] for suggestions on securing your monitor.    Follow-Up: Pending results of monitor  Any Other Special Instructions Will Be Listed Below (If Applicable).  If you need a refill on your cardiac medications before your next appointment, please call your pharmacy.Marland Kitchen

## 2022-08-10 DIAGNOSIS — E876 Hypokalemia: Secondary | ICD-10-CM | POA: Insufficient documentation

## 2022-08-10 DIAGNOSIS — I4719 Other supraventricular tachycardia: Secondary | ICD-10-CM | POA: Insufficient documentation

## 2022-08-10 DIAGNOSIS — I491 Atrial premature depolarization: Secondary | ICD-10-CM | POA: Insufficient documentation

## 2022-08-10 NOTE — Progress Notes (Signed)
Cardiology Office Note  Date: 08/10/2022   ID: Linda Knight, DOB November 23, 1940, MRN [id number]  PCP:  Linda Dark, MD  Cardiologist:  Linda Bicker, MD Electrophysiologist:  None   Reason for Office Visit: Eval of AFib   History of Present Illness: Linda Knight is a 82 y.o. female known to have HTN, HLD, hypothyroidism was referred to cardiology clinic for evaluation of atrial fibrillation.  Patient was having palpitations associated dizziness for 4 to 5 days prompting ER visit on 08/05/2022.  EKG showed frequent PACs, NSR and brief episode of atrial tachycardia.  I did not notice any definite evidence of atrial fibrillation.  She was discharged on metoprolol and Eliquis based on high CHA2DS2-VASc score.  Patient is here, reports she is stressed out due to her knee issues for few days prior to ER visit.  She said this might have caused her palpitations and dizziness.  She is tearful during my interview.  She is currently on metoprolol tartrate 12.5 mg twice daily and Eliquis 5 mg twice daily.  No other symptoms of angina, DOE, syncope.  No more palpitations or dizziness.  Past Medical History:  Diagnosis Date   Anemia    Arthritis    CAD (coronary artery disease)    High cholesterol    Hypertension    Pancreatic cyst    SCCA (squamous cell carcinoma) of skin 2019   Right Arm [facility])   Sleep apnea    Squamous cell carcinoma of skin 2019   Left Leg [facility])   Thyroid disease     Past Surgical History:  Procedure Laterality Date   ABDOMINAL HYSTERECTOMY     APPENDECTOMY     BIOPSY  09/27/2019   Procedure: BIOPSY;  Surgeon: Linda Fee, MD;  Location: [facility];  Service: Endoscopy;;   BREAST EXCISIONAL BIOPSY Right    BUNIONECTOMY     carcinoma removed Right 09/2020   CYSTOCELE REPAIR     bladder repair   ESOPHAGOGASTRODUODENOSCOPY (EGD) WITH PROPOFOL N/A 09/27/2019   Procedure: ESOPHAGOGASTRODUODENOSCOPY (EGD) WITH PROPOFOL;  Surgeon:  Linda Fee, MD;  [facility];  Service: Endoscopy;  Laterality: N/A;   EUS N/A 09/27/2019   Procedure: UPPER ENDOSCOPIC ULTRASOUND (EUS) RADIAL;  Surgeon: Linda Fee, MD;  [facility];  Service: Endoscopy;  Laterality: N/A;   KNEE ARTHROSCOPY  06/2021   TYMPANOPLASTY Left 06/2020   VAGINAL PROLAPSE REPAIR  12/07/2019    Current Outpatient Medications  Medication Sig Dispense Refill   amLODipine-benazepril (LOTREL) 5-10 MG capsule TAKE 1 CAPSULE BY MOUTH EVERY DAY 90 capsule 1   aspirin EC 81 MG tablet Take 81 mg by mouth daily. Swallow whole.     Cholecalciferol (VITAMIN D3) 50 MCG (2000 UT) capsule Take 2,000 Units by mouth daily.     citalopram (CELEXA) 10 MG tablet TAKE 1 TABLET DAILY 90 tablet 3   conjugated estrogens (PREMARIN) vaginal cream Place 1 Applicatorful vaginally once a week. 42.5 g 12   levothyroxine (SYNTHROID) 50 MCG tablet Take 1 tablet (50 mcg total) by mouth daily. 90 tablet 3   metoprolol tartrate (LOPRESSOR) 25 MG tablet Take 0.5 tablets (12.5 mg total) by mouth 2 (two) times daily. 30 tablet 0   POTASSIUM CHLORIDE PO Take by mouth.     potassium chloride SA (KLOR-CON M) 20 MEQ tablet Take 1 tablet (20 mEq total) by mouth 2 (two) times daily. 180 tablet 3   zolpidem (AMBIEN CR) 6.25  MG CR tablet Take 2 tablets (12.5 mg total) by mouth at bedtime as needed for sleep. 60 tablet 5   atorvastatin (LIPITOR) 20 MG tablet Take 1 tablet (20 mg total) by mouth daily. (Patient not taking: Reported on 08/09/2022) 90 tablet 2   meclizine (ANTIVERT) 12.5 MG tablet Take 1 tablet (12.5 mg total) by mouth 3 (three) times daily as needed for dizziness or nausea. (Patient not taking: Reported on 08/09/2022) 30 tablet 0   No current facility-administered medications for this visit.   Allergies:  Tape   Social History: The patient  reports that she quit smoking about 41 years ago. Her smoking use included cigarettes. She has never used smokeless tobacco.  She reports that she does not currently use alcohol. She reports that she does not use drugs.   Family History: The patient's family history includes Heart disease in her brother; Hyperlipidemia in her mother; Hypertension in her mother; Lung disease in her father; Stroke in her mother.   ROS:  Please see the history of present illness. Otherwise, complete review of systems is positive for none.  All other systems are reviewed and negative.   Physical Exam: VS:  BP 124/78   Pulse (!) 58   Ht 5\' 4"  (1.626 m)   Wt 138 lb 3.2 oz (62.7 kg)   SpO2 100%   BMI 23.72 kg/m , BMI Body mass index is 23.72 kg/m.  Wt Readings from Last 3 Encounters:  08/09/22 138 lb 3.2 oz (62.7 kg)  08/05/22 139 lb (63 kg)  08/04/22 135 lb 6 oz (61.4 kg)    General: Patient appears comfortable at rest. HEENT: Conjunctiva and lids normal, oropharynx clear with moist mucosa. Neck: Supple, no elevated JVP or carotid bruits, no thyromegaly. Lungs: Clear to auscultation, nonlabored breathing at rest. Cardiac: Regular rate and rhythm, no S3 or significant systolic murmur, no pericardial rub. Abdomen: Soft, nontender, no hepatomegaly, bowel sounds present, no guarding or rebound. Extremities: No pitting edema, distal pulses 2+. Skin: Warm and dry. Musculoskeletal: No kyphosis. Neuropsychiatric: Alert and oriented x3, affect grossly appropriate.  Recent Labwork: 01/19/2022: TSH 2.57 02/10/2022: ALT 21; AST 26 08/05/2022: BUN 19; Creatinine, Ser 0.99; Hemoglobin 12.3; Magnesium 1.8; Platelets 309; Potassium 3.7; Sodium 130     Component Value Date/Time   CHOL 252 (H) 01/19/2022 0852   TRIG 112.0 01/19/2022 0852   HDL 94.70 01/19/2022 0852   CHOLHDL 3 01/19/2022 0852   VLDL 22.4 01/19/2022 0852   LDLCALC 135 (H) 01/19/2022 0852   LDLCALC 74 11/14/2019 0839    Assessment and Plan:   # Frequent PACs with brief episodes of atrial tachycardia likely secondary to severe hypokalemia: I reviewed all the EKGs on the  EMR which showed NSR, frequent PACs and brief episodes of atrial tachycardia especially from ER visit, 08/04/2022 to 08/05/2022. These PACs and arrhythmias are likely secondary to severe hypokalemia. Serum potassium was 2.9.  Will start her on p.o. potassium supplements and obtain 2-week event monitor to rule out any definitive atrial fibrillation.  In addition, she was previously on HCTZ that might have caused hypokalemia as well.  Will find this out on the repeat BMP in 5 days.  Will stop Eliquis in the interim and continue metoprolol tartrate 12.5 mg twice daily.  # Severe hypokalemia during the ER visit on 08/05/2022: Start KCl 20 mg twice daily. Obtain BMP in 5 days.  # HTN, controlled: Previously on HCTZ which was discontinued due to hyperkalemia.  Continue amlodipine-benazepril 5-10 mg once  daily.  I have spent a total of 45 minutes with patient reviewing chart, EKGs, labs and examining patient as well as establishing an assessment and plan that was discussed with the patient.  > 50% of time was spent in direct patient care.    Medication Adjustments/Labs and Tests Ordered: Current medicines are reviewed at length with the patient today.  Concerns regarding medicines are outlined above.   Tests Ordered: Orders Placed This Encounter  Procedures   Basic metabolic panel   LONG TERM MONITOR (3-14 DAYS)   EKG 12-Lead    Medication Changes: Meds ordered this encounter  Medications   potassium chloride SA (KLOR-CON M) 20 MEQ tablet    Sig: Take 1 tablet (20 mEq total) by mouth 2 (two) times daily.    Dispense:  180 tablet    Refill:  3    Disposition:  Follow up  pending results  Signed, Ramzey Petrovic Verne Spurr, MD, 08/10/2022 9:33 AM    [facility] at [facility] [address], [place], [place] [address]

## 2022-08-13 ENCOUNTER — Other Ambulatory Visit (HOSPITAL_COMMUNITY)
Admission: RE | Admit: 2022-08-13 | Discharge: 2022-08-13 | Disposition: A | Discharge status: Home / Self Care | Payer: Medicare Other | Source: Ambulatory Visit | Attending: Internal Medicine | Admitting: Internal Medicine

## 2022-08-13 DIAGNOSIS — Z79899 Other long term (current) drug therapy: Secondary | ICD-10-CM | POA: Insufficient documentation

## 2022-08-13 LAB — BASIC METABOLIC PANEL
Anion gap: 8 (ref 5–15)
BUN: 22 mg/dL (ref 8–23)
CO2: 26 mmol/L (ref 22–32)
Calcium: 8.8 mg/dL — ABNORMAL LOW (ref 8.9–10.3)
Chloride: 97 mmol/L — ABNORMAL LOW (ref 98–111)
Creatinine, Ser: 0.96 mg/dL (ref 0.44–1.00)
GFR, Estimated: 60 mL/min — ABNORMAL LOW (ref >60)
Glucose, Bld: 103 mg/dL — ABNORMAL HIGH (ref 70–99)
Potassium: 3.9 mmol/L (ref 3.5–5.1)
Sodium: 131 mmol/L — ABNORMAL LOW (ref 135–145)

## 2022-08-25 ENCOUNTER — Other Ambulatory Visit: Payer: Self-pay | Admitting: *Deleted

## 2022-08-25 ENCOUNTER — Telehealth: Payer: Self-pay | Admitting: Family Medicine

## 2022-08-25 ENCOUNTER — Other Ambulatory Visit: Payer: Self-pay | Admitting: Family Medicine

## 2022-08-25 MED ORDER — CITALOPRAM HYDROBROMIDE 10 MG PO TABS: 10.0000 mg | ORAL_TABLET | Freq: Every day | ORAL | 3 refills | Status: DC

## 2022-08-25 NOTE — Telephone Encounter (Signed)
[facility]  

## 2022-08-25 NOTE — Telephone Encounter (Signed)
Prescription Request  08/25/2022  LOV: 07/22/2022  What is the name of the medication or equipment? citalopram (CELEXA) 10 MG tablet   Have you contacted your pharmacy to request a refill? Yes   Which pharmacy would you like this sent to?   [id number] [place], [place] - [address] [place] [address] Phone: [phone] Fax: [phone]     Patient notified that their request is being sent to the clinical staff for review and that they should receive a response within 2 business days.   Please advise at Mobile [phone] (mobile)

## 2022-08-26 ENCOUNTER — Ambulatory Visit (INDEPENDENT_AMBULATORY_CARE_PROVIDER_SITE_OTHER): Payer: Medicare Other | Admitting: Family

## 2022-08-26 VITALS — BP 130/79 | HR 66 | Temp 97.5°F | Ht 64.0 in | Wt 136.2 lb

## 2022-08-26 DIAGNOSIS — H60502 Unspecified acute noninfective otitis externa, left ear: Secondary | ICD-10-CM

## 2022-08-26 DIAGNOSIS — M25562 Pain in left knee: Secondary | ICD-10-CM | POA: Diagnosis not present

## 2022-08-26 DIAGNOSIS — G8929 Other chronic pain: Secondary | ICD-10-CM

## 2022-08-26 MED ORDER — CIPROFLOXACIN-HYDROCORTISONE 0.2-1 % OT SUSP
3.0000 [drp] | Freq: Two times a day (BID) | OTIC | 0 refills | Status: AC
Start: 2022-08-26 — End: 2022-09-02

## 2022-08-26 NOTE — Progress Notes (Signed)
Patient ID: Linda Knight, female    DOB: 06-10-1940, 82 y.o.   MRN: [id number]  Chief Complaint  Patient presents with   Ear Pain    Pt c/o dizziness and left ear pain for 4 days, Has been keeping air out with a cotton ball.    HPI:      Ear pain:   pt reports having some tenderness in her ear for 2 or 3 days. She reports having hx of tympanostomy procedure that failed & she did not want to have done again, so she is careful to wear ear plugs around water.   Left knee pain:  has chronic pain, hx of arthroscopic surgery a few years ago, seen by PCP 2 mos ago and had steroid injection which helped pain, but has had some swelling again in front of her knee for last week or 2, denies more pain than usual. Applies Biofreeze at night to help her sleep. She is wondering if the fluid is because she is retaining fluid or if arthritis related.   Assessment & Plan:  1. Acute otitis externa of left ear, unspecified type sending ear drops to loosen ear wax, treat any external infection, assured pt they are save w/perforated TM. Advised ok to have a little shower water in ear and shake out afterwards, avoid lakes, rivers, ocean, pools unless using ear plug.   - ciprofloxacin-hydrocortisone (CIPRO HC OTIC) OTIC suspension; Place 3 drops into the left ear 2 (two) times daily for 7 days.  Dispense: 10 mL; Refill: 0  2. Chronic pain of left knee- mild swelling noted right anterior side of knee, mild tenderness with palpation. Advised she can try OTC Diclofenac sodium gel 3-4x/day and apply ice up to 20-16min tid prn. Advised pt this is local swelling r/t arthritis of her knee, and increasing her hydrochlorothiazide will not decrease this, no edema in lower leg, ankle or foot.   Subjective:    Outpatient Medications Prior to Visit  Medication Sig Dispense Refill   amLODipine-benazepril (LOTREL) 5-10 MG capsule TAKE 1 CAPSULE BY MOUTH EVERY DAY 90 capsule 1   aspirin EC 81 MG tablet Take 81 mg by  mouth daily. Swallow whole.     atorvastatin (LIPITOR) 20 MG tablet Take 1 tablet (20 mg total) by mouth daily. 90 tablet 2   Cholecalciferol (VITAMIN D3) 50 MCG (2000 UT) capsule Take 2,000 Units by mouth daily.     citalopram (CELEXA) 10 MG tablet Take 1 tablet (10 mg total) by mouth daily. 90 tablet 3   conjugated estrogens (PREMARIN) vaginal cream Place 1 Applicatorful vaginally once a week. 42.5 g 12   levothyroxine (SYNTHROID) 50 MCG tablet Take 1 tablet (50 mcg total) by mouth daily. 90 tablet 3   meclizine (ANTIVERT) 12.5 MG tablet Take 1 tablet (12.5 mg total) by mouth 3 (three) times daily as needed for dizziness or nausea. 30 tablet 0   metoprolol tartrate (LOPRESSOR) 25 MG tablet Take 0.5 tablets (12.5 mg total) by mouth 2 (two) times daily. 30 tablet 0   POTASSIUM CHLORIDE PO Take by mouth.     potassium chloride SA (KLOR-CON M) 20 MEQ tablet Take 1 tablet (20 mEq total) by mouth 2 (two) times daily. 180 tablet 3   zolpidem (AMBIEN CR) 6.25 MG CR tablet Take 2 tablets (12.5 mg total) by mouth at bedtime as needed for sleep. 60 tablet 5   No facility-administered medications prior to visit.   Past Medical History:  Diagnosis Date  Anemia    Arthritis    CAD (coronary artery disease)    High cholesterol    Hypertension    Pancreatic cyst    SCCA (squamous cell carcinoma) of skin 2019   Right Arm [facility])   Sleep apnea    Squamous cell carcinoma of skin 2019   Left Leg [facility])   Thyroid disease    Past Surgical History:  Procedure Laterality Date   ABDOMINAL HYSTERECTOMY     APPENDECTOMY     BIOPSY  09/27/2019   Procedure: BIOPSY;  Surgeon: Rachael Fee, MD;  [facility];  Service: Endoscopy;;   BREAST EXCISIONAL BIOPSY Right    BUNIONECTOMY     carcinoma removed Right 09/2020   CYSTOCELE REPAIR     bladder repair   ESOPHAGOGASTRODUODENOSCOPY (EGD) WITH PROPOFOL N/A 09/27/2019   Procedure: ESOPHAGOGASTRODUODENOSCOPY (EGD) WITH PROPOFOL;   Surgeon: Rachael Fee, MD;  [facility];  Service: Endoscopy;  Laterality: N/A;   EUS N/A 09/27/2019   Procedure: UPPER ENDOSCOPIC ULTRASOUND (EUS) RADIAL;  Surgeon: Rachael Fee, MD;  [facility];  Service: Endoscopy;  Laterality: N/A;   KNEE ARTHROSCOPY  06/2021   TYMPANOPLASTY Left 06/2020   VAGINAL PROLAPSE REPAIR  12/07/2019   Allergies  Allergen Reactions   Tape Rash      Objective:    Physical Exam Vitals and nursing note reviewed.  Constitutional:      Appearance: Normal appearance.  HENT:     Right Ear: Tympanic membrane and ear canal normal.     Left Ear: Drainage (cerumen) and tenderness present. No swelling.     Ears:     Comments: Unable to visualize left TM Cardiovascular:     Rate and Rhythm: Normal rate and regular rhythm.  Pulmonary:     Effort: Pulmonary effort is normal.     Breath sounds: Normal breath sounds.  Musculoskeletal:        General: Normal range of motion.     Left knee: Bony tenderness present. Normal range of motion. Tenderness (anterior right side of knee) present.  Skin:    General: Skin is warm and dry.  Neurological:     Mental Status: She is alert.  Psychiatric:        Mood and Affect: Mood normal.        Behavior: Behavior normal.    BP 130/79   Pulse 66   Temp (!) 97.5 F (36.4 C) (Temporal)   Ht 5\' 4"  (1.626 m)   Wt 136 lb 3.2 oz (61.8 kg)   SpO2 98%   BMI 23.38 kg/m  Wt Readings from Last 3 Encounters:  08/26/22 136 lb 3.2 oz (61.8 kg)  08/09/22 138 lb 3.2 oz (62.7 kg)  08/05/22 139 lb (63 kg)       Dulce Sellar, NP

## 2022-08-26 NOTE — Patient Instructions (Addendum)
It was very nice to see you today!   Look for generic Voltaren gel (Diclofenac sodium) to apply to your knees. This is an anti-inflammatory gel you can use up to 3-4 times per day.  I am sending over ear drops for your left ear. I do not think it is infected, but it may soften the wax a little to be easier to remove.    PLEASE NOTE:  If you had any lab tests please let us know if you have not heard back within a few days. You may see your results on MyChart before we have a chance to review them but we will give you a call once they are reviewed by [place]. If we ordered any referrals today, please let us know if you have not heard from their office within the next week.

## 2022-09-10 ENCOUNTER — Telehealth: Payer: Self-pay | Admitting: Family Medicine

## 2022-09-10 NOTE — Telephone Encounter (Signed)
Patient notified has one more refill at the pharmacy  Rx amlodipine #90

## 2022-09-10 NOTE — Telephone Encounter (Signed)
Prescription Request  09/10/2022  LOV: 07/22/2022  What is the name of the medication or equipment? amLODipine-benazepril (LOTREL) 5-10 MG capsule   Have you contacted your pharmacy to request a refill? Yes   Which pharmacy would you like this sent to?   [id number] [place], [place] - [address] [place] [address] Phone: [phone] Fax: [phone]     Patient notified that their request is being sent to the clinical staff for review and that they should receive a response within 2 business days.   Please advise at Mobile [phone] (mobile)

## 2022-09-29 ENCOUNTER — Ambulatory Visit: Payer: Medicare Other | Admitting: Internal Medicine

## 2022-10-14 ENCOUNTER — Encounter: Payer: Self-pay | Admitting: Family Medicine

## 2022-10-14 ENCOUNTER — Ambulatory Visit (INDEPENDENT_AMBULATORY_CARE_PROVIDER_SITE_OTHER): Payer: Medicare Other | Admitting: Family Medicine

## 2022-10-14 VITALS — BP 137/84 | HR 72 | Temp 97.5°F | Ht 64.0 in | Wt 137.0 lb

## 2022-10-14 DIAGNOSIS — M7062 Trochanteric bursitis, left hip: Secondary | ICD-10-CM

## 2022-10-14 DIAGNOSIS — I10 Essential (primary) hypertension: Secondary | ICD-10-CM

## 2022-10-14 DIAGNOSIS — G8929 Other chronic pain: Secondary | ICD-10-CM

## 2022-10-14 DIAGNOSIS — M25562 Pain in left knee: Secondary | ICD-10-CM

## 2022-10-14 DIAGNOSIS — Z23 Encounter for immunization: Secondary | ICD-10-CM | POA: Diagnosis not present

## 2022-10-14 DIAGNOSIS — M1990 Unspecified osteoarthritis, unspecified site: Secondary | ICD-10-CM | POA: Diagnosis not present

## 2022-10-14 MED ORDER — METHYLPREDNISOLONE ACETATE 40 MG/ML IJ SUSP
40.0000 mg | Freq: Once | INTRAMUSCULAR | Status: AC
Start: 2022-10-14 — End: 2022-10-14
  Administered 2022-10-14: 40 mg via INTRA_ARTICULAR

## 2022-10-14 NOTE — Assessment & Plan Note (Signed)
She did well with previous third injection would like to have this repeated today.  See below procedure note.  She tolerated well.  If this continues to be an issue will need to follow-up with sports medicine orthopedics.

## 2022-10-14 NOTE — Progress Notes (Signed)
Linda Knight is a 82 y.o. female who presents today for an office visit.  Assessment/Plan:  New/Acute Problems: Loose Stools May be related to chronic pancreatitis.  Recommend she restart fiber supplementation.  She will need to follow back up with GI if not improving.  Left hip pain Exam consistent with trochanteric bursitis.  Injection performed today.  She tolerated well.  See below procedure note.  Discussed home exercise program and handout was given.  We discussed reasons to return to care. She will let us know if not improving and we can refer to physical therapy or sports med.  Chronic Problems Addressed Today: Osteoarthritis She did well with previous third injection would like to have this repeated today.  See below procedure note.  She tolerated well.  If this continues to be an issue will need to follow-up with sports medicine orthopedics.  Essential hypertension At goal on HCTZ 12.5 mg daily and amlodipine-benazepril 5-10 once daily.    Subjective:  HPI:  See A/P for status of chronic conditions.  Patient is here today with left hip and left knee pain.  We did see her about 3 months ago for left knee pain as well.  She elected to have steroid injection performed at that visit which she tolerated well.  Recently she has noticed a recurrence of pain mostly in the back of her left knee.  She would like to have steroid injection performed again.  She is also noticed pain on the left part of her hip.  This been going on for several weeks.  No obvious injuries or precipitating events.  No specific treatment tried for this.  She has been also having some issues with loose stools.  This has been off for quite a while and she has previously been following with GI for this.  Last saw them about 4 months ago.  She tried stopping her Creon and Metamucil with some improvement however still has persistent symptoms.       Objective:  Physical Exam: BP 137/84   Pulse 72   Temp  (!) 97.5 F (36.4 C) (Temporal)   Ht 5\' 4"  (1.626 m)   Wt 137 lb (62.1 kg)   SpO2 99%   BMI 23.52 kg/m   Gen: No acute distress, resting comfortably MUSCULOSKELETAL: - left hip: No deformities.  Tenderness palpation along greater trochanter.  Limited internal and external rotation - Left knee: Slight effusion noted.  Tenderness palpation along joint line.  Neurovascular intact distally. Neuro: Grossly normal, moves all extremities Psych: Normal affect and thought content  Knee Injection Procedure Note  Indication: Symptom relief of left Knee Pain.  Procedure Details  Verbal consent was obtained for the procedure. The joint was prepped with Betadine. Topical ethyl chloride was applied for anesthesia. A 22 gauge needle was inserted into the medial aspect of the joint.  3 ml 1% lidocaine and 1 ml of 40mg /cc Depo-Medrol was then injected into the joint. The needle was removed and the area cleansed and dressed.  Complications:  None; patient tolerated the procedure well.   Bursa Injection Procedure Note  Pre-operative Diagnosis: left Trochanteric bursitis  Post-operative Diagnosis: same  Indications: Diagnosis and treatment of symptomatic bursal effusion  Anesthesia: Topical Ethyl Chloride  Procedure Details   After a discussion of the risks and benefits with the patient (including the possibility that any manipulation of the bursa could introduce infection, worsening the current situation significantly), verbal consent was obtained for the procedure. The joint was  prepped with Betadine.  Topical ethyl chloride was applied for anesthesia.  A 22-gauge needle was then inserted into the area of maximal tenderness.  3 mL of 1% lidocaine and 1 mL of 40 mg/cc of Depo-Medrol was then injected into the point of maximal tenderness.  The needle was then removed and the area was cleansed and dressed.  Complications:  None; patient tolerated the procedure well.       [name]. Jimmey Ralph,  MD 10/14/2022 11:37 AM

## 2022-10-14 NOTE — Patient Instructions (Addendum)
It was very nice to see you today!  We gave you a steroid shot in your knee and your hip.  You can use ice to both these areas for the next few days.  Please work on the exercises for your hip.  Let us know if not improving.  Please try taking a fiber supplement such as Metamucil or Benefiber for your loose stools.  Return if symptoms worsen or fail to improve.   Take care, Dr Jimmey Ralph  PLEASE NOTE:  If you had any lab tests, please let us know if you have not heard back within a few days. You may see your results on mychart before we have a chance to review them but we will give you a call once they are reviewed by [place].   If we ordered any referrals today, please let us know if you have not heard from their office within the next week.   If you had any urgent prescriptions sent in today, please check with the pharmacy within an hour of our visit to make sure the prescription was transmitted appropriately.   Please try these tips to maintain a healthy lifestyle:  Eat at least 3 REAL meals and 1-2 snacks per day.  Aim for no more than 5 hours between eating.  If you eat breakfast, please do so within one hour of getting up.   Each meal should contain half fruits/vegetables, one quarter protein, and one quarter carbs (no bigger than a computer mouse)  Cut down on sweet beverages. This includes juice, soda, and sweet tea.   Drink at least 1 glass of water with each meal and aim for at least 8 glasses per day  Exercise at least 150 minutes every week.

## 2022-10-14 NOTE — Assessment & Plan Note (Signed)
At goal on HCTZ 12.5 mg daily and amlodipine-benazepril 5-10 once daily.

## 2022-10-29 ENCOUNTER — Other Ambulatory Visit: Payer: Self-pay | Admitting: Family Medicine

## 2022-10-29 DIAGNOSIS — E039 Hypothyroidism, unspecified: Secondary | ICD-10-CM

## 2022-12-03 ENCOUNTER — Ambulatory Visit (INDEPENDENT_AMBULATORY_CARE_PROVIDER_SITE_OTHER): Payer: Medicare Other | Admitting: Family Medicine

## 2022-12-03 ENCOUNTER — Encounter: Payer: Self-pay | Admitting: Family Medicine

## 2022-12-03 VITALS — BP 146/86 | HR 102 | Temp 97.7°F | Ht 64.0 in | Wt 138.6 lb

## 2022-12-03 DIAGNOSIS — K8689 Other specified diseases of pancreas: Secondary | ICD-10-CM

## 2022-12-03 DIAGNOSIS — E039 Hypothyroidism, unspecified: Secondary | ICD-10-CM

## 2022-12-03 DIAGNOSIS — G8929 Other chronic pain: Secondary | ICD-10-CM | POA: Diagnosis not present

## 2022-12-03 DIAGNOSIS — M5440 Lumbago with sciatica, unspecified side: Secondary | ICD-10-CM

## 2022-12-03 MED ORDER — METHYLPREDNISOLONE ACETATE 80 MG/ML IJ SUSP
80.0000 mg | Freq: Once | INTRAMUSCULAR | Status: AC
  Administered 2022-12-03: 80 mg via INTRAMUSCULAR

## 2022-12-03 NOTE — Patient Instructions (Signed)
It was very nice to see you today!  I think have an inflamed sciatic nerve.  Please work on the exercises.  Will give you a steroid injection today.  We will check blood work today.  Return if symptoms worsen or fail to improve.   Take care, Dr Jimmey Ralph  PLEASE NOTE:  If you had any lab tests, please let us know if you have not heard back within a few days. You may see your results on mychart before we have a chance to review them but we will give you a call once they are reviewed by [place].   If we ordered any referrals today, please let us know if you have not heard from their office within the next week.   If you had any urgent prescriptions sent in today, please check with the pharmacy within an hour of our visit to make sure the prescription was transmitted appropriately.   Please try these tips to maintain a healthy lifestyle:  Eat at least 3 REAL meals and 1-2 snacks per day.  Aim for no more than 5 hours between eating.  If you eat breakfast, please do so within one hour of getting up.   Each meal should contain half fruits/vegetables, one quarter protein, and one quarter carbs (no bigger than a computer mouse)  Cut down on sweet beverages. This includes juice, soda, and sweet tea.   Drink at least 1 glass of water with each meal and aim for at least 8 glasses per day  Exercise at least 150 minutes every week.

## 2022-12-03 NOTE — Progress Notes (Signed)
   Linda Knight is a 82 y.o. female who presents today for an office visit.  Assessment/Plan:  Chronic Problems Addressed Today: Chronic low back pain with sciatica She has had an acute flare the last few days.  No red flag signs or symptoms.  No obvious precipitating events however exam and history is consistent with piriformis syndrome.  We discussed home exercise program and handout was given.  Will give 80 mg of Depo-Medrol today.  She will follow-up with [place] in a week or 2.  If symptoms are not improving would consider referral to PT and sports medicine.  We discussed reasons return to care.  Hypothyroidism Check TSH.  She is on Synthroid 50 mcg daily.  Pancreatic insufficiency Follows with GI.  Likely the source of her frequent bowel movements recently.  She is no longer on Creon.  Will check labs today.  If labs are negative we will need to have her follow back up with gastroenterology.      Subjective:  HPI:  See Assessment / plan for status of chronic conditions.  Patient is here today for follow-up.  We last saw her about 6 weeks ago for osteoarthritis.  At that time we performed intra-articular steroid injection in her left knee.  Also performed greater trochanter injection in the left hip.  She did well with these and symptoms resolved.  Was doing well for several weeks until she has had worsening left leg pain over the last few days.  Initially noticed pain in left posterior knee.  This has progressed to involve pain from her buttocks down into her feet.  Described as a burning sensation.  She is worried about inflamed nerve.  She has had sciatica in the past.  No specific treatments tried.  No obvious injuries.  No weakness or numbness reported.  She is also having more issues with frequent bowel movements for the last several months.  This does seem to be getting worse the last few months.  No obvious aggravating factors though does think that it may have been triggered after  eating a spicy sauce several months ago.  She does have a history of pancreatic insufficiency and is not sure if this is contributing.  No recent illnesses.  No recent change in medications.       Objective:  Physical Exam: BP (!) 146/86   Pulse (!) 102   Temp 97.7 F (36.5 C) (Temporal)   Ht 5\' 4"  (1.626 m)   Wt 138 lb 9.6 oz (62.9 kg)   SpO2 98%   BMI 23.79 kg/m   Gen: No acute distress, resting comfortably CV: Regular rate and rhythm with no murmurs appreciated Pulm: Normal work of breathing, clear to auscultation bilaterally with no crackles, wheezes, or rhonchi MUSCULOSKELETAL: - Back: No deformities.  Tenderness palpation along left medial lower lumbar and upper gluteal muscle groups. - Leg: No deformities.  Nontender to palpation.  Full range of motion throughout.  Neurovascular intact distally.  Knee palpated without obvious edema or effusions. Neuro: Grossly normal, moves all extremities Psych: Normal affect and thought content      [place]op[place] Sloop M. Jimmey Ralph, MD 12/03/2022 12:43 PM

## 2022-12-03 NOTE — Assessment & Plan Note (Signed)
She has had an acute flare the last few days.  No red flag signs or symptoms.  No obvious precipitating events however exam and history is consistent with piriformis syndrome.  We discussed home exercise program and handout was given.  Will give 80 mg of Depo-Medrol today.  She will follow-up with [place] in a week or 2.  If symptoms are not improving would consider referral to PT and sports medicine.  We discussed reasons return to care.

## 2022-12-03 NOTE — Assessment & Plan Note (Signed)
Check TSH.  She is on Synthroid 50 mcg daily.

## 2022-12-03 NOTE — Addendum Note (Signed)
Addended by: Dyann Kief on: 12/03/2022 12:52 PM   Modules accepted: Orders

## 2022-12-03 NOTE — Assessment & Plan Note (Signed)
Follows with GI.  Likely the source of her frequent bowel movements recently.  She is no longer on Creon.  Will check labs today.  If labs are negative we will need to have her follow back up with gastroenterology.

## 2022-12-07 ENCOUNTER — Other Ambulatory Visit: Payer: Self-pay | Admitting: *Deleted

## 2022-12-07 ENCOUNTER — Other Ambulatory Visit (INDEPENDENT_AMBULATORY_CARE_PROVIDER_SITE_OTHER): Payer: Medicare Other

## 2022-12-07 DIAGNOSIS — R739 Hyperglycemia, unspecified: Secondary | ICD-10-CM

## 2022-12-07 DIAGNOSIS — E039 Hypothyroidism, unspecified: Secondary | ICD-10-CM

## 2022-12-07 DIAGNOSIS — K8689 Other specified diseases of pancreas: Secondary | ICD-10-CM

## 2022-12-07 LAB — COMPREHENSIVE METABOLIC PANEL
ALT: 23 U/L (ref 0–35)
AST: 24 U/L (ref 0–37)
Albumin: 4.4 g/dL (ref 3.5–5.2)
Alkaline Phosphatase: 73 U/L (ref 39–117)
BUN: 20 mg/dL (ref 6–23)
CO2: 32 meq/L (ref 19–32)
Calcium: 9.5 mg/dL (ref 8.4–10.5)
Chloride: 96 meq/L (ref 96–112)
Creatinine, Ser: 0.98 mg/dL (ref 0.40–1.20)
GFR: 54.38 mL/min — ABNORMAL LOW (ref >60.00)
Glucose, Bld: 91 mg/dL (ref 70–99)
Potassium: 3.4 meq/L — ABNORMAL LOW (ref 3.5–5.1)
Sodium: 136 meq/L (ref 135–145)
Total Bilirubin: 0.4 mg/dL (ref 0.2–1.2)
Total Protein: 7.3 g/dL (ref 6.0–8.3)

## 2022-12-07 LAB — CBC
HCT: 35.4 % — ABNORMAL LOW (ref 36.0–46.0)
Hemoglobin: 11.8 g/dL — ABNORMAL LOW (ref 12.0–15.0)
MCHC: 33.4 g/dL (ref 30.0–36.0)
MCV: 103.2 fL — ABNORMAL HIGH (ref 78.0–100.0)
Platelets: 297 10*3/uL (ref 150.0–400.0)
RBC: 3.43 Mil/uL — ABNORMAL LOW (ref 3.87–5.11)
RDW: 13.3 % (ref 11.5–15.5)
WBC: 5.3 10*3/uL (ref 4.0–10.5)

## 2022-12-07 LAB — LIPASE: Lipase: 30 U/L (ref 11.0–59.0)

## 2022-12-07 LAB — TSH: TSH: 3.29 u[IU]/mL (ref 0.35–5.50)

## 2022-12-07 LAB — HEMOGLOBIN A1C: Hgb A1c MFr Bld: 5.7 % (ref 4.6–6.5)

## 2022-12-07 NOTE — Addendum Note (Signed)
Addended by: Dyann Kief on: 12/07/2022 09:25 AM   Modules accepted: Orders

## 2022-12-07 NOTE — Progress Notes (Signed)
ai

## 2022-12-08 NOTE — Progress Notes (Signed)
Her labs indicate she is slightly anemic however this is stable compared to her previous values.  The rest of her labs are all normal including her thyroid, blood sugar, kidney function, liver function, and pancreas labs.  Recommend she follow-up with GI for her frequent bowel movements as we discussed at her office visit.

## 2023-01-10 ENCOUNTER — Telehealth: Payer: Self-pay | Admitting: Family Medicine

## 2023-01-10 NOTE — Telephone Encounter (Signed)
Please make sure its a 12.5mg      Prescription Request  01/10/2023  LOV: 12/03/2022  What is the name of the medication or equipment?  zolpidem (AMBIEN CR)    Have you contacted your pharmacy to request a refill? Yes   Which pharmacy would you like this sent to? [id number] [place], [place] - [address] [place] [address] Phone: [phone] Fax: [phone]   Patient notified that their request is being sent to the clinical staff for review and that they should receive a response within 2 business days.   Please advise at Mobile [phone] (mobile)

## 2023-01-12 ENCOUNTER — Ambulatory Visit: Payer: Medicare Other

## 2023-01-12 VITALS — Wt 138.0 lb

## 2023-01-12 DIAGNOSIS — Z0000 Encounter for general adult medical examination without abnormal findings: Secondary | ICD-10-CM | POA: Diagnosis not present

## 2023-01-12 MED ORDER — ZOLPIDEM TARTRATE ER 12.5 MG PO TBCR: 12.5000 mg | EXTENDED_RELEASE_TABLET | Freq: Every evening | ORAL | 5 refills | Status: DC | PRN

## 2023-01-12 NOTE — Patient Instructions (Signed)
Ms. Dylaney Criscione , Thank you for taking time to come for your Medicare Wellness Visit. I appreciate your ongoing commitment to your health goals. Please review the following plan we discussed and let me know if I can assist you in the future.   Referrals/Orders/Follow-Ups/Clinician Recommendations: Aim for 30 minutes of exercise or brisk walking, 6-8 glasses of water, and 5 servings of fruits and vegetables each day.   This is a list of the screening recommended for you and due dates:  Health Maintenance  Topic Date Due   COVID-19 Vaccine (6 - 2023-24 season) 10/10/2022   Medicare Annual Wellness Visit  01/12/2024   DTaP/Tdap/Td vaccine (2 - Td or Tdap) 11/27/2031   Pneumonia Vaccine  Completed   Flu Shot  Completed   DEXA scan (bone density measurement)  Completed   HPV Vaccine  Aged Out   Hepatitis C Screening  Discontinued   Zoster (Shingles) Vaccine  Discontinued    Advanced directives: (Copy Requested) Please bring a copy of your health care power of attorney and living will to the office to be added to your chart at your convenience.  Next Medicare Annual Wellness Visit scheduled for next year: Yes

## 2023-01-12 NOTE — Telephone Encounter (Signed)
See note

## 2023-01-12 NOTE — Progress Notes (Signed)
Subjective:   Linda Knight is a 82 y.o. female who presents for Medicare Annual (Subsequent) preventive examination.  Visit Complete: Virtual I connected with  Linda Knight on 01/12/23 by a audio enabled telemedicine application and verified that I am speaking with the correct person using two identifiers.  Patient Location: Home  Provider Location: Home Office  I discussed the limitations of evaluation and management by telemedicine. The patient expressed understanding and agreed to proceed.  Vital Signs: Because this visit was a virtual/telehealth visit, some criteria may be missing or patient reported. Any vitals not documented were not able to be obtained and vitals that have been documented are patient reported.   Cardiac Risk Factors include: advanced age (>15men, >85 women);dyslipidemia;hypertension     Objective:    Today's Vitals   01/12/23 1103  Weight: 138 lb (62.6 kg)   Body mass index is 23.69 kg/m.     01/12/2023   11:08 AM 08/05/2022    9:31 AM 04/15/2022   12:50 PM 01/05/2022   10:33 AM 06/02/2021    3:28 PM 12/23/2020   10:27 AM 09/27/2019    9:02 AM  Advanced Directives  Does Patient Have a Medical Advance Directive? Yes No No Yes Yes Yes Yes  Type of Estate agent of [place];Living will   Healthcare Power of [place];Living will Healthcare Power of [place];Living will Healthcare Power of [place];Living will Healthcare Power of [place];Living will  Does patient want to make changes to medical advance directive?     No - Patient declined    Copy of Healthcare Power of Attorney in Chart? No - copy requested   No - copy requested  No - copy requested No - copy requested  Would patient like information on creating a medical advance directive?  No - Patient declined No - Patient declined        Current Medications (verified) Outpatient Encounter Medications as of 01/12/2023  Medication Sig   amLODipine-benazepril (LOTREL)  5-10 MG capsule TAKE 1 CAPSULE BY MOUTH EVERY DAY   aspirin EC 81 MG tablet Take 81 mg by mouth daily. Swallow whole.   atorvastatin (LIPITOR) 20 MG tablet Take 1 tablet (20 mg total) by mouth daily.   Cholecalciferol (VITAMIN D3) 50 MCG (2000 UT) capsule Take 2,000 Units by mouth daily.   citalopram (CELEXA) 10 MG tablet Take 1 tablet (10 mg total) by mouth daily.   conjugated estrogens (PREMARIN) vaginal cream Place 1 Applicatorful vaginally once a week.   levothyroxine (SYNTHROID) 50 MCG tablet TAKE 1 TABLET BY MOUTH EVERY DAY   metoprolol tartrate (LOPRESSOR) 25 MG tablet Take 0.5 tablets (12.5 mg total) by mouth 2 (two) times daily.   potassium chloride SA (KLOR-CON M) 20 MEQ tablet Take 1 tablet (20 mEq total) by mouth 2 (two) times daily.   zolpidem (AMBIEN CR) 12.5 MG CR tablet Take 1 tablet (12.5 mg total) by mouth at bedtime as needed for sleep.   [DISCONTINUED] meclizine (ANTIVERT) 12.5 MG tablet Take 1 tablet (12.5 mg total) by mouth 3 (three) times daily as needed for dizziness or nausea.   No facility-administered encounter medications on file as of 01/12/2023.    Allergies (verified) Tape   History: Past Medical History:  Diagnosis Date   Anemia    Arthritis    CAD (coronary artery disease)    High cholesterol    Hypertension    Pancreatic cyst    SCCA (squamous cell carcinoma) of skin 2019  Right Arm [facility])   Sleep apnea    Squamous cell carcinoma of skin 2019   Left Leg [facility])   Thyroid disease    Past Surgical History:  Procedure Laterality Date   ABDOMINAL HYSTERECTOMY     APPENDECTOMY     BIOPSY  09/27/2019   Procedure: BIOPSY;  Surgeon: Rachael Fee, MD;  Location: [facility];  Service: Endoscopy;;   BREAST EXCISIONAL BIOPSY Right    BUNIONECTOMY     carcinoma removed Right 09/2020   CYSTOCELE REPAIR     bladder repair   ESOPHAGOGASTRODUODENOSCOPY (EGD) WITH PROPOFOL N/A 09/27/2019   Procedure: ESOPHAGOGASTRODUODENOSCOPY (EGD) WITH  PROPOFOL;  Surgeon: Rachael Fee, MD;  Location: [facility];  Service: Endoscopy;  Laterality: N/A;   EUS N/A 09/27/2019   Procedure: UPPER ENDOSCOPIC ULTRASOUND (EUS) RADIAL;  Surgeon: Rachael Fee, MD;  Location: [facility];  Service: Endoscopy;  Laterality: N/A;   KNEE ARTHROSCOPY  06/2021   TYMPANOPLASTY Left 06/2020   VAGINAL PROLAPSE REPAIR  12/07/2019   Family History  Problem Relation Age of Onset   Hyperlipidemia Mother    Hypertension Mother    Stroke Mother    Lung disease Father    Heart disease Brother    Colon cancer Neg Hx    Social History   Socioeconomic History   Marital status: Married    Spouse name: Not on file   Number of children: 3   Years of education: Not on file   Highest education level: Not on file  Occupational History   Occupation: Retired   Tobacco Use   Smoking status: Former    Current packs/day: 0.00    Types: Cigarettes    Quit date: 09/18/1980    Years since quitting: 42.3   Smokeless tobacco: Never  Vaping Use   Vaping status: Never Used  Substance and Sexual Activity   Alcohol use: Not Currently   Drug use: Never   Sexual activity: Not Currently    Comment: 1st intercourse 82 yo-Fewer than 5 partners  Other Topics Concern   Not on file  Social History Narrative   Moved from [place] July 2020   Social Determinants of Health   Financial Resource Strain: Low Risk  (01/12/2023)   Overall Financial Resource Strain (CARDIA)    Difficulty of Paying Living Expenses: Not hard at all  Food Insecurity: No Food Insecurity (01/12/2023)   Hunger Vital Sign    Worried About Running Out of Food in the Last Year: Never true    Ran Out of Food in the Last Year: Never true  Transportation Needs: No Transportation Needs (01/12/2023)   PRAPARE - Administrator, Civil Service (Medical): No    Lack of Transportation (Non-Medical): No  Physical Activity: Insufficiently Active (01/12/2023)   Exercise Vital Sign    Days of  Exercise per Week: 3 days    Minutes of Exercise per Session: 20 min  Stress: No Stress Concern Present (01/12/2023)   [employer] of Occupational Health - Occupational Stress Questionnaire    Feeling of Stress : Not at all  Social Connections: Moderately Isolated (01/12/2023)   Social Connection and Isolation Panel [NHANES]    Frequency of Communication with Friends and Family: More than three times a week    Frequency of Social Gatherings with Friends and Family: More than three times a week    Attends Religious Services: Never    Database administrator or Organizations: No  Attends Banker Meetings: Never    Marital Status: Married    Tobacco Counseling Counseling given: Not Answered   Clinical Intake:  Pre-visit preparation completed: Yes  Pain : No/denies pain     BMI - recorded: 23.69 Nutritional Status: BMI of 19-24  Normal Nutritional Risks: None Diabetes: No  How often do you need to have someone help you when you read instructions, pamphlets, or other written materials from your doctor or pharmacy?: 1 - Never  Interpreter Needed?: No  Information entered by :: Lanier Ensign, LPN   Activities of Daily Living    01/12/2023   11:04 AM  In your present state of health, do you have any difficulty performing the following activities:  Hearing? 1  Comment slight HOH left ear  Vision? 0  Difficulty concentrating or making decisions? 0  Walking or climbing stairs? 1  Comment arthritis  Dressing or bathing? 0  Doing errands, shopping? 0  Preparing Food and eating ? N  Using the Toilet? N  In the past six months, have you accidently leaked urine? N  Do you have problems with loss of bowel control? N  Managing your Medications? N  Managing your Finances? N  Housekeeping or managing your Housekeeping? N    Patient Care Team: Ardith Dark, MD as PCP - General (Family Medicine) Mallipeddi, Orion Modest, MD as PCP - Cardiology  (Cardiology) Ardell Isaacs, MD as Consulting Physician (Pain Medicine) Janalyn Harder, MD (Inactive) as Consulting Physician (Dermatology)  Indicate any recent Medical Services you may have received from other than [facility] providers in the past year (date may be approximate).     Assessment:   This is a routine wellness examination for Linda Knight.  Hearing/Vision screen Hearing Screening - Comments:: HOH left ear  Vision Screening - Comments:: Pt follows [facility] with [facility] for annual eye exams    Goals Addressed             This Visit's Progress    Patient Stated       Get in more exercise        Depression Screen    01/12/2023   11:07 AM 12/03/2022   11:40 AM 10/14/2022   10:58 AM 07/22/2022    8:41 AM 05/24/2022    8:46 AM 01/19/2022    8:06 AM 01/07/2022    9:08 AM  PHQ 2/9 Scores  PHQ - 2 Score 0 0 0 0 0 0 0    Fall Risk    01/12/2023   11:08 AM 12/03/2022   11:41 AM 10/14/2022   10:58 AM 07/22/2022    8:41 AM 05/24/2022    8:46 AM  Fall Risk   Falls in the past year? 0 0 0 0 0  Number falls in past yr: 0 0 0 0 0  Injury with Fall? 0 0 0 0 0  Risk for fall due to : No Fall Risks No Fall Risks No Fall Risks No Fall Risks No Fall Risks;Impaired balance/gait  Follow up Falls prevention discussed        MEDICARE RISK AT HOME: Medicare Risk at Home Any stairs in or around the home?: Yes If so, are there any without handrails?: No Home free of loose throw rugs in walkways, pet beds, electrical cords, etc?: Yes Adequate lighting in your home to reduce risk of falls?: Yes Life alert?: No Use of a cane, walker or w/c?: No Grab bars in the bathroom?: No Shower chair or bench in shower?: Yes  Elevated toilet seat or a handicapped toilet?: Yes  TIMED UP AND GO:  Was the test performed?  No    Cognitive Function:        01/12/2023   11:10 AM 01/05/2022   10:37 AM 12/23/2020   10:30 AM 05/22/2019    2:25 PM  6CIT Screen  What Year? 0 points 0 points 0 points 0 points   What month? 0 points 0 points 0 points 0 points  What time? 0 points 0 points 0 points 0 points  Count back from 20 0 points 0 points 0 points 0 points  Months in reverse 0 points 0 points 0 points 0 points  Repeat phrase 0 points 0 points 0 points 0 points  Total Score 0 points 0 points 0 points 0 points    Immunizations Immunization History  Administered Date(s) Administered   Fluad Quad(high Dose 65+) 11/14/2019, 12/09/2020, 11/25/2021   Fluad Trivalent(High Dose 65+) 10/14/2022   Influenza, High Dose Seasonal PF 11/11/2018, 11/14/2019   PFIZER Comirnaty(Gray Top)Covid-19 Tri-Sucrose Vaccine 03/25/2019, 04/17/2019   PFIZER(Purple Top)SARS-COV-2 Vaccination 03/25/2019, 04/17/2019, 12/22/2020   PNEUMOCOCCAL CONJUGATE-20 03/11/2021   Tdap 11/26/2021    TDAP status: Up to date  Flu Vaccine status: Up to date  Pneumococcal vaccine status: Up to date  Covid-19 vaccine status: Information provided on how to obtain vaccines.   Qualifies for Shingles Vaccine? No   Zostavax completed No   Shingrix Completed?: No.    Education has been provided regarding the importance of this vaccine. Patient has been advised to call insurance company to determine out of pocket expense if they have not yet received this vaccine. Advised may also receive vaccine at local pharmacy or Health Dept. Verbalized acceptance and understanding.  Screening Tests Health Maintenance  Topic Date Due   COVID-19 Vaccine (6 - 2023-24 season) 10/10/2022   Medicare Annual Wellness (AWV)  01/12/2024   DTaP/Tdap/Td (2 - Td or Tdap) 11/27/2031   Pneumonia Vaccine 26+ Years old  Completed   INFLUENZA VACCINE  Completed   DEXA SCAN  Completed   HPV VACCINES  Aged Out   Hepatitis C Screening  Discontinued   Zoster Vaccines- Shingrix  Discontinued    Health Maintenance  Health Maintenance Due  Topic Date Due   COVID-19 Vaccine (6 - 2023-24 season) 10/10/2022    Colorectal cancer screening: No longer required.    Mammogram status: Completed 08/02/22. Repeat every year  Bone Density status: Completed 10/14/17. Results reflect: Bone density results: OSTEOPENIA. Repeat every 2 years.   Additional Screening:  Hepatitis C Screening:  Completed 03/11/21  Vision Screening: Recommended annual ophthalmology exams for early detection of glaucoma and other disorders of the eye. Is the patient up to date with their annual eye exam?  Yes  Who is the provider or what is the name of the office in which the patient attends annual eye exams? [facility]  If pt is not established with a provider, would they like to be referred to a provider to establish care? No .   Dental Screening: Recommended annual dental exams for proper oral hygiene   Community Resource Referral / Chronic Care Management: CRR required this visit?  No   CCM required this visit?  No     Plan:     I have personally reviewed and noted the following in the patient's chart:   Medical and social history Use of alcohol, tobacco or illicit drugs  Current medications and supplements including opioid prescriptions. Patient is not currently  taking opioid prescriptions. Functional ability and status Nutritional status Physical activity Advanced directives List of other physicians Hospitalizations, surgeries, and ER visits in previous 12 months Vitals Screenings to include cognitive, depression, and falls Referrals and appointments  In addition, I have reviewed and discussed with patient certain preventive protocols, quality metrics, and best practice recommendations. A written personalized care plan for preventive services as well as general preventive health recommendations were provided to patient.     Marzella Schlein, LPN   16/02/958   After Visit Summary: (MyChart) Due to this being a telephonic visit, the after visit summary with patients personalized plan was offered to patient via MyChart   Nurse Notes: none

## 2023-03-04 ENCOUNTER — Other Ambulatory Visit: Payer: Self-pay | Admitting: Family Medicine

## 2023-03-21 ENCOUNTER — Other Ambulatory Visit: Payer: Self-pay | Admitting: Family Medicine

## 2023-03-21 MED ORDER — ESTROGENS CONJUGATED 0.625 MG/GM VA CREA: 1.0000 | TOPICAL_CREAM | VAGINAL | 12 refills | Status: AC

## 2023-03-21 NOTE — Telephone Encounter (Signed)
 Copied from CRM [id number]. Topic: Clinical - Medication Refill >> Mar 21, 2023 10:43 AM Tisa Forester wrote: Most Recent Primary Care Visit:  Provider: Bruno Capri  Department: [facility]  Visit Type: MEDICARE AWV, SEQUENTIAL  Date: 01/12/2023  Medication: conjugated estrogens  (PREMARIN ) vaginal cream  Has the patient contacted their pharmacy? No  (Agent: If no, request that the patient contact the pharmacy for the refill. If patient does not wish to contact the pharmacy document the reason why and proceed with request.) (Agent: If yes, when and what did the pharmacy advise?)  Is this the correct pharmacy for this prescription? Yes If no, delete pharmacy and type the correct one.  This is the patient's preferred pharmacy:   [facility] [phone] - [place], [place] - [address] AT [facility] [address] [place] [address] Phone: [phone] Fax: [phone]   Has the prescription been filled recently? No  Is the patient out of the medication? No , almost out   Has the patient been seen for an appointment in the last year OR does the patient have an upcoming appointment? Yes  Can we respond through MyChart? Yes  Agent: Please be advised that Rx refills may take up to 3 business days. We ask that you follow-up with your pharmacy.

## 2023-03-28 ENCOUNTER — Telehealth: Payer: Self-pay

## 2023-03-28 NOTE — Telephone Encounter (Signed)
Copied from CRM [id number]. Topic: Clinical - Medication Question >> Mar 25, 2023 12:35 PM Taleah C wrote: Reason for CRM: pt called and stated that her gastro doctor did provided her with lipase/protease/amylase (CREON) 36000 UNITS CPEP capsule. She explained that [facility] will be sending over information to Dr. Jimmey Ralph for him to continue prescribing the medication for the patient. Please call and advise.  FYI for patient and continuing medication, if anything further needed please advise

## 2023-03-31 ENCOUNTER — Other Ambulatory Visit: Payer: Self-pay | Admitting: *Deleted

## 2023-04-09 ENCOUNTER — Other Ambulatory Visit: Payer: Self-pay | Admitting: Family Medicine

## 2023-04-11 ENCOUNTER — Ambulatory Visit: Payer: Self-pay | Admitting: Family Medicine

## 2023-04-11 NOTE — Telephone Encounter (Signed)
 Pt was disconnected with prior nurse, this RN completed the assessment and scheduled the pt for an appt.  Pain level 5-6 by evening if been on feet all day. Pt has not had this before, unsure what could be causing this.  Denies fever, CP, calf pain, Difficulty breathing, SOB Chief Complaint: L leg/ankle swelling, some in R Symptoms: swelling, achy  Frequency: 2 weeks approx Pertinent Negatives: Patient denies fever, redness, SOB, CP, calf pain,  Disposition: [] ED /[] Urgent Care (no appt availability in office) / [x] Appointment(In office/virtual)/ []  [facility]/ [] Home Care/ [] Refused Recommended Disposition /[] [facility]/ []  Follow-up with PCP Additional Notes: Pt states that she has had swelling bilaterally. But the L ankle/leg has been more swollen than normal. Pt unsure what could be causing this. Pt states that her BP has been fluctuating and that her water intake may have changed. Pt sched soonest appt at pcp office, No avail appts with pcp.   Reason for Disposition  MILD or MODERATE ankle swelling (e.g., can't move joint normally, can't do usual activities) (Exceptions: Itchy, localized swelling; swelling is chronic.)  Protocols used: Ankle Swelling-A-AH

## 2023-04-11 NOTE — Telephone Encounter (Signed)
 Additional Notes: Pt states she has has mild to moderate ankle/leg swelling for about two months.   Copied from CRM [id number]. Topic: Clinical - Red Word Triage >> Apr 11, 2023 10:39 AM Marica Otter wrote: [facility] that prompted transfer to Nurse Triage: Ankles and legs are swelling . Answer Assessment - Initial Assessment Questions 1. LOCATION: "Which ankle is swollen?" "Where is the swelling?"     Left leg/ankle more swollen than right 2. ONSET: "When did the swelling start?"     About two weeks 3. SWELLING: "How bad is the swelling?" Or, "How large is it?" (e.g., mild, moderate, severe; size of localized swelling)    - NONE: No joint swelling.   - LOCALIZED: Localized; small area of puffy or swollen skin (e.g., insect bite, skin irritation).   - MILD: Joint looks or feels mildly swollen or puffy.   - MODERATE: Swollen; interferes with normal activities (e.g., work or school); decreased range of movement; may be limping.   - SEVERE: Very swollen; can't move swollen joint at all; limping a lot or unable to walk.     Mild to moderate 4. PAIN: "Is there any pain?" If Yes, ask: "How bad is it?" (Scale 1-10; or mild, moderate, severe)   - NONE (0): no pain.   - MILD (1-3): doesn't interfere with normal activities.    - MODERATE (4-7): interferes with normal activities (e.g., work or school) or awakens from sleep, limping.    - SEVERE (8-10): excruciating pain, unable to do any normal activities, unable to walk.  Protocols used: Ankle Swelling-A-AH

## 2023-04-11 NOTE — Telephone Encounter (Signed)
 This call was dropped. This RN made first attempt to contact pt.

## 2023-04-11 NOTE — Telephone Encounter (Signed)
 noted

## 2023-04-13 ENCOUNTER — Ambulatory Visit: Admitting: Physician Assistant

## 2023-04-18 ENCOUNTER — Encounter: Payer: Self-pay | Admitting: Family Medicine

## 2023-04-18 ENCOUNTER — Ambulatory Visit (INDEPENDENT_AMBULATORY_CARE_PROVIDER_SITE_OTHER): Admitting: Family Medicine

## 2023-04-18 VITALS — BP 145/72 | HR 87 | Temp 97.2°F | Ht 64.0 in | Wt 137.6 lb

## 2023-04-18 DIAGNOSIS — E785 Hyperlipidemia, unspecified: Secondary | ICD-10-CM | POA: Diagnosis not present

## 2023-04-18 DIAGNOSIS — R739 Hyperglycemia, unspecified: Secondary | ICD-10-CM

## 2023-04-18 DIAGNOSIS — I10 Essential (primary) hypertension: Secondary | ICD-10-CM

## 2023-04-18 DIAGNOSIS — M5440 Lumbago with sciatica, unspecified side: Secondary | ICD-10-CM | POA: Diagnosis not present

## 2023-04-18 DIAGNOSIS — G8929 Other chronic pain: Secondary | ICD-10-CM

## 2023-04-18 LAB — COMPREHENSIVE METABOLIC PANEL
ALT: 21 U/L (ref 0–35)
AST: 25 U/L (ref 0–37)
Albumin: 4.4 g/dL (ref 3.5–5.2)
Alkaline Phosphatase: 71 U/L (ref 39–117)
BUN: 17 mg/dL (ref 6–23)
CO2: 28 meq/L (ref 19–32)
Calcium: 9.6 mg/dL (ref 8.4–10.5)
Chloride: 93 meq/L — ABNORMAL LOW (ref 96–112)
Creatinine, Ser: 0.99 mg/dL (ref 0.40–1.20)
GFR: 53.58 mL/min — ABNORMAL LOW (ref >60.00)
Glucose, Bld: 102 mg/dL — ABNORMAL HIGH (ref 70–99)
Potassium: 3.4 meq/L — ABNORMAL LOW (ref 3.5–5.1)
Sodium: 132 meq/L — ABNORMAL LOW (ref 135–145)
Total Bilirubin: 0.5 mg/dL (ref 0.2–1.2)
Total Protein: 7.1 g/dL (ref 6.0–8.3)

## 2023-04-18 LAB — LIPID PANEL
Cholesterol: 196 mg/dL (ref 0–200)
HDL: 80 mg/dL (ref >39.00)
LDL Cholesterol: 93 mg/dL (ref 0–99)
NonHDL: 116.34
Total CHOL/HDL Ratio: 2
Triglycerides: 116 mg/dL (ref 0.0–149.0)
VLDL: 23.2 mg/dL (ref 0.0–40.0)

## 2023-04-18 LAB — HEMOGLOBIN A1C: Hgb A1c MFr Bld: 5.5 % (ref 4.6–6.5)

## 2023-04-18 LAB — CBC
HCT: 32.5 % — ABNORMAL LOW (ref 36.0–46.0)
Hemoglobin: 11.3 g/dL — ABNORMAL LOW (ref 12.0–15.0)
MCHC: 34.7 g/dL (ref 30.0–36.0)
MCV: 98.9 fl (ref 78.0–100.0)
Platelets: 283 10*3/uL (ref 150.0–400.0)
RBC: 3.28 Mil/uL — ABNORMAL LOW (ref 3.87–5.11)
RDW: 13 % (ref 11.5–15.5)
WBC: 4.3 10*3/uL (ref 4.0–10.5)

## 2023-04-18 LAB — TSH: TSH: 0.99 u[IU]/mL (ref 0.35–5.50)

## 2023-04-18 MED ORDER — BENAZEPRIL HCL 10 MG PO TABS: 10.0000 mg | ORAL_TABLET | Freq: Every day | ORAL | 3 refills | Status: DC

## 2023-04-18 NOTE — Patient Instructions (Addendum)
 It was very nice to see you today!  I think your leg swelling is probably coming from your blood pressure med.  We will switch her amlodipine benazepril to just benazepril.  Stop taking the pill that has amlodipine in it.  Please try to keep your legs elevated.  You can also use compression stockings.  Let me know in 1 to 2 weeks how you are doing.   Please call Dr Denyse Amass at  [phone]  Return if symptoms worsen or fail to improve.   Take care, Dr Jimmey Ralph  PLEASE NOTE:  If you had any lab tests, please let us know if you have not heard back within a few days. You may see your results on mychart before we have a chance to review them but we will give you a call once they are reviewed by [place].   If we ordered any referrals today, please let us know if you have not heard from their office within the next week.   If you had any urgent prescriptions sent in today, please check with the pharmacy within an hour of our visit to make sure the prescription was transmitted appropriately.   Please try these tips to maintain a healthy lifestyle:  Eat at least 3 REAL meals and 1-2 snacks per day.  Aim for no more than 5 hours between eating.  If you eat breakfast, please do so within one hour of getting up.   Each meal should contain half fruits/vegetables, one quarter protein, and one quarter carbs (no bigger than a computer mouse)  Cut down on sweet beverages. This includes juice, soda, and sweet tea.   Drink at least 1 glass of water with each meal and aim for at least 8 glasses per day  Exercise at least 150 minutes every week.     Preventive Care 39 Years and Older, Female Preventive care refers to lifestyle choices and visits with your health care provider that can promote health and wellness. Preventive care visits are also called wellness exams. What can I expect for my preventive care visit? Counseling Your health care provider may ask you questions about your: Medical history,  including: Past medical problems. Family medical history. Pregnancy and menstrual history. History of falls. Current health, including: Memory and ability to understand (cognition). Emotional well-being. Home life and relationship well-being. Sexual activity and sexual health. Lifestyle, including: Alcohol, nicotine or tobacco, and drug use. Access to firearms. Diet, exercise, and sleep habits. Work and work Astronomer. Sunscreen use. Safety issues such as seatbelt and bike helmet use. Physical exam Your health care provider will check your: Height and weight. These may be used to calculate your BMI (body mass index). BMI is a measurement that tells if you are at a healthy weight. Waist circumference. This measures the distance around your waistline. This measurement also tells if you are at a healthy weight and may help predict your risk of certain diseases, such as type 2 diabetes and high blood pressure. Heart rate and blood pressure. Body temperature. Skin for abnormal spots. What immunizations do I need?  Vaccines are usually given at various ages, according to a schedule. Your health care provider will recommend vaccines for you based on your age, medical history, and lifestyle or other factors, such as travel or where you work. What tests do I need? Screening Your health care provider may recommend screening tests for certain conditions. This may include: Lipid and cholesterol levels. Hepatitis C test. Hepatitis B test. HIV (human  immunodeficiency virus) test. STI (sexually transmitted infection) testing, if you are at risk. Lung cancer screening. Colorectal cancer screening. Diabetes screening. This is done by checking your blood sugar (glucose) after you have not eaten for a while (fasting). Mammogram. Talk with your health care provider about how often you should have regular mammograms. BRCA-related cancer screening. This may be done if you have a family history of  breast, ovarian, tubal, or peritoneal cancers. Bone density scan. This is done to screen for osteoporosis. Talk with your health care provider about your test results, treatment options, and if necessary, the need for more tests. Follow these instructions at home: Eating and drinking  Eat a diet that includes fresh fruits and vegetables, whole grains, lean protein, and low-fat dairy products. Limit your intake of foods with high amounts of sugar, saturated fats, and salt. Take vitamin and mineral supplements as recommended by your health care provider. Do not drink alcohol if your health care provider tells you not to drink. If you drink alcohol: Limit how much you have to 0-1 drink a day. Know how much alcohol is in your drink. In the [place] one drink equals one 12 oz bottle of beer (355 mL), one 5 oz glass of wine (148 mL), or one 1 oz glass of hard liquor (44 mL). Lifestyle Brush your teeth every morning and night with fluoride toothpaste. Floss one time each day. Exercise for at least 30 minutes 5 or more days each week. Do not use any products that contain nicotine or tobacco. These products include cigarettes, chewing tobacco, and vaping devices, such as e-cigarettes. If you need help quitting, ask your health care provider. Do not use drugs. If you are sexually active, practice safe sex. Use a condom or other form of protection in order to prevent STIs. Take aspirin only as told by your health care provider. Make sure that you understand how much to take and what form to take. Work with your health care provider to find out whether it is safe and beneficial for you to take aspirin daily. Ask your health care provider if you need to take a cholesterol-lowering medicine (statin). Find healthy ways to manage stress, such as: Meditation, yoga, or listening to music. Journaling. Talking to a trusted person. Spending time with friends and family. Minimize exposure to UV radiation to reduce  your risk of skin cancer. Safety Always wear your seat belt while driving or riding in a vehicle. Do not drive: If you have been drinking alcohol. Do not ride with someone who has been drinking. When you are tired or distracted. While texting. If you have been using any mind-altering substances or drugs. Wear a helmet and other protective equipment during sports activities. If you have firearms in your house, make sure you follow all gun safety procedures. What's next? Visit your health care provider once a year for an annual wellness visit. Ask your health care provider how often you should have your eyes and teeth checked. Stay up to date on all vaccines. This information is not intended to replace advice given to you by your health care provider. Make sure you discuss any questions you have with your health care provider. Document Revised: 07/23/2020 Document Reviewed: 07/23/2020 Elsevier Patient Education  2024 [employer].

## 2023-04-18 NOTE — Assessment & Plan Note (Signed)
Check lipids.  She is on Lipitor 20 mg daily.

## 2023-04-18 NOTE — Progress Notes (Signed)
   Linda Knight is a 83 y.o. female who presents today for an office visit.  Assessment/Plan:  New/Acute Problems: Leg edema No red flags.  Likely multifactorial.  Likely does have some component of venous insufficiency though amlodipine is also likely contributing.  We did discuss conservative measures including leg elevation, compression stockings, and avoidance of sodium.  We will stop her amlodipine.  Continue with just HCTZ 12.5 mg daily, benazepril 10 mg daily.  Check labs. She will follow-up with [place] in a few weeks.   Chronic Problems Addressed Today: Essential hypertension Elevated today however she is at goal at home.  Additionally has had a few low readings into the 90s over 60s with some dizziness.  We will stop her amlodipine as this is likely contributing to her above lower extremity edema as well.  Continue benazepril 10 mg daily and HCTZ 12.5 mg daily.  Chronic low back pain with sciatica Symptoms have flared up for the last few weeks.  She is interested in having a repeat epidural steroid injection.  Advised her to follow back up with sports medicine for this soon.  Dyslipidemia Check lipids.  She is on Lipitor 20 mg daily.    Subjective:  HPI:  See A/P for status of chronic conditions.  Patient is here today with bilateral ankle swelling. This started a few weeks ago. Mostly happening at night. No reported chest pain or shortness of breath. No orthopnea.  She has been compliant with her medications.  She is little concerned she may be dehydrated.  She has had worsening arthritis pain the last few weeks as well.  She also has had a flareup of her sciatica for the last few weeks.  She is interested in receiving another epidural steroid injection.  She had this done several years ago and did well with it.       Objective:  Physical Exam: BP (!) 145/72   Pulse 87   Temp (!) 97.2 F (36.2 C)   Ht 5\' 4"  (1.626 m)   Wt 137 lb 9.6 oz (62.4 kg)   SpO2 100%   BMI 23.62  kg/m   Gen: No acute distress, resting comfortably CV: Regular rate and rhythm with no murmurs appreciated Pulm: Normal work of breathing, clear to auscultation bilaterally with no crackles, wheezes, or rhonchi MUSCULOSKELETAL: 1+ pitting edema to midshin bilaterally. Neuro: Grossly normal, moves all extremities Psych: Normal affect and thought content      Michial Disney M. Jimmey Ralph, MD 04/18/2023 9:30 AM

## 2023-04-18 NOTE — Assessment & Plan Note (Signed)
 Symptoms have flared up for the last few weeks.  She is interested in having a repeat epidural steroid injection.  Advised her to follow back up with sports medicine for this soon.

## 2023-04-18 NOTE — Assessment & Plan Note (Addendum)
 Elevated today however she is at goal at home.  Additionally has had a few low readings into the 90s over 60s with some dizziness.  We will stop her amlodipine as this is likely contributing to her above lower extremity edema as well.  Continue benazepril 10 mg daily and HCTZ 12.5 mg daily.

## 2023-04-19 ENCOUNTER — Encounter: Payer: Self-pay | Admitting: Family Medicine

## 2023-04-19 NOTE — Progress Notes (Signed)
 Her sodium is a little low.  She has been low in the past and that is near her baseline.  We can recheck again at her next office visit.  She is also a little bit more anemic than last time we checked however this is at her baseline as well.  The rest of her labs are all at goal.  As we discussed at her office visit her leg swelling should improve with stopping the amlodipine.  I would like for her to follow-up with [place] in a couple of weeks as we discussed at her office visit.  She should let us know if her leg swelling is not improving or if her blood pressure is not controlled at home.

## 2023-04-22 ENCOUNTER — Ambulatory Visit

## 2023-04-22 ENCOUNTER — Ambulatory Visit: Admitting: Family Medicine

## 2023-04-22 ENCOUNTER — Encounter: Payer: Self-pay | Admitting: Family Medicine

## 2023-04-22 VITALS — BP 110/84 | HR 68 | Ht 64.0 in | Wt 139.0 lb

## 2023-04-22 DIAGNOSIS — M5416 Radiculopathy, lumbar region: Secondary | ICD-10-CM

## 2023-04-22 MED ORDER — GABAPENTIN 100 MG PO CAPS: 100.0000 mg | ORAL_CAPSULE | Freq: Every evening | ORAL | 2 refills | Status: DC | PRN

## 2023-04-22 NOTE — Progress Notes (Signed)
 I, Linda Knight, CMA acting as a scribe for Linda Graham, MD.  Linda Knight is a 83 y.o. female who presents to [facility] at [facility] today for LBP. Pt was previously seen by Dr. Denyse Amass on 03/24/22 for bilat hip pain  Today, pt c/o LBP x 2-3 weeks. Burning sensation from the lower back down into the leg. Sx started after suddenly turning to the left.  Denies groin/hip pain. Has been stretching and taking tylenol with minimal relief. Sx worse with prolonged sitting and twisting/turning left. Sx causing night disturbance. Also having some pain in the right hip, right side sleeper.   Radiating pain: L LE LE numbness/tingling: no LE weakness: no Aggravates: prolonged sitting, twisting left Treatments tried: depo-medrol IM injection  Pertinent review of systems: No fevers or chills  Relevant historical information: History of left lumbar radiculopathy.  She did have an injection at an orthopedic or neurosurgery office in the remote past which helped.   Exam:  BP 110/84   Pulse 68   Ht 5\' 4"  (1.626 m)   Wt 139 lb (63 kg)   SpO2 98%   BMI 23.86 kg/m  General: Well Developed, well nourished, and in no acute distress.   MSK: L-spine nontender to palpation.  Reduced lumbar motion. Lower extremity strength is intact.     Lab and Radiology Results  X-ray images lumbar spine obtained today personally and independently interpreted. Scoliosis and multilevel DDD.  No acute fractures are visible. Await formal radiology review   EXAM: MRI LUMBAR SPINE WITHOUT CONTRAST   TECHNIQUE: Multiplanar, multisequence MR imaging of the lumbar spine was performed. No intravenous contrast was administered.   COMPARISON:  None.   FINDINGS: Segmentation:  5 lumbar type vertebral bodies assumed.   Alignment: Curvature convex to the left with the apex at L2-3. 3 mm degenerative anterolisthesis L5-S1.   Vertebrae:  No fracture or primary bone lesion.   Conus  medullaris and cauda equina: Conus extends to the L1 level. Conus and cauda equina appear normal.   Paraspinal and other soft tissues: Negative   Disc levels:   Mild desiccation and bulging of the discs at L2-3 and above. No stenosis or neural compression.   L3-4: Mild bulging of the disc. Mild facet and ligamentous hypertrophy. Mild narrowing of the lateral recesses but no visible neural compression.   L4-5: Disc degeneration more pronounced on the left. Endplate osteophytes and bulging of the disc more prominent on the left. Facet degeneration and hypertrophy more prominent on the left. Narrowing of the intervertebral foramen on the left in the left lateral recess that could possibly cause left-sided neural compression.   L5-S1: Facet osteoarthritis worse on the left, allowing anterolisthesis of 3 mm. Bulging of the disc more towards the left. Left foraminal narrowing that could possibly affect the exiting L5 nerve.   IMPRESSION: Scoliotic curvature convex to the left with the apex at L2-3.   L3-4: Disc bulge. Mild facet hypertrophy. Mild stenosis but without visible neural compression.   L4-5: Left-sided predominant degenerative disc disease and degenerative facet disease with narrowing of the intervertebral foramen on the left in the left lateral recess that could possibly cause left-sided neural compression.   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 Bulging of the disc more towards the left. Foraminal narrowing on the left that could possibly affect the exiting left L5 nerve. Additionally, the left facet joint could be a cause of back pain or  referred facet syndrome pain.     Electronically Signed   By: Paulina Fusi M.D.   On: 07/13/2019 10:49 I, Linda Knight, personally (independently) visualized and performed the interpretation of the images attached in this note.   Assessment and Plan: 83 y.o. female with left lumbar  radiculopathy L5 dermatomal pattern.  This is an acute exacerbation of a chronic problem.  She had a similar episode in 2021 that resulted in an MRI showing potential for left L5 nerve impingement and an epidural steroid injection at the pain management office that worked until recently.  Plan for updated x-ray today and epidural steroid injection.  Limited gabapentin prescribed for use at bedtime as needed.   PDMP not reviewed this encounter. Orders Placed This Encounter  Procedures   DG INJECT DIAG/THERA/INC NEEDLE/CATH/PLC EPI/LUMB/SAC W/IMG    Level and technique per radiology    Standing Status:   Future    Expiration Date:   05/23/2023    Reason for Exam (SYMPTOM  OR DIAGNOSIS REQUIRED):   Low back pain    Preferred Imaging Location?:   [facility]   DG Lumbar Spine 2-3 Views    Standing Status:   Future    Number of Occurrences:   1    Expiration Date:   05/23/2023    Reason for Exam (SYMPTOM  OR DIAGNOSIS REQUIRED):   lumbar radiculopathy    Preferred imaging location?:   [facility]   Meds ordered this encounter  Medications   DISCONTD: gabapentin (NEURONTIN) 100 MG capsule    Sig: Take 1-3 capsules (100-300 mg total) by mouth at bedtime as needed.    Dispense:  90 capsule    Refill:  2   gabapentin (NEURONTIN) 100 MG capsule    Sig: Take 1-3 capsules (100-300 mg total) by mouth at bedtime as needed.    Dispense:  90 capsule    Refill:  2     Discussed warning signs or symptoms. Please see discharge instructions. Patient expresses understanding.   The above documentation has been reviewed and is accurate and complete Linda Knight, M.D.

## 2023-04-22 NOTE — Patient Instructions (Signed)
 Thank you for coming in today.   Please get an Xray today before you leave   We've placed an order for a back injection, you should hear about scheduling over the next 1-2 weeks.

## 2023-05-02 NOTE — Discharge Instructions (Signed)

## 2023-05-03 ENCOUNTER — Other Ambulatory Visit: Payer: Self-pay | Admitting: Family Medicine

## 2023-05-03 ENCOUNTER — Ambulatory Visit
Admission: RE | Admit: 2023-05-03 | Discharge: 2023-05-03 | Disposition: A | Discharge status: Home / Self Care | Source: Ambulatory Visit | Attending: Family Medicine | Admitting: Family Medicine

## 2023-05-03 DIAGNOSIS — M5416 Radiculopathy, lumbar region: Secondary | ICD-10-CM

## 2023-05-03 MED ORDER — METHYLPREDNISOLONE ACETATE 40 MG/ML INJ SUSP (RADIOLOG
80.0000 mg | Freq: Once | INTRAMUSCULAR | Status: AC
  Administered 2023-05-03: 80 mg via EPIDURAL

## 2023-05-03 MED ORDER — IOPAMIDOL (ISOVUE-M 200) INJECTION 41%
1.0000 mL | Freq: Once | INTRAMUSCULAR | Status: AC
  Administered 2023-05-03: 1 mL via EPIDURAL

## 2023-05-04 ENCOUNTER — Encounter: Payer: Self-pay | Admitting: Family Medicine

## 2023-05-04 NOTE — Progress Notes (Signed)
 Low back x-ray shows scoliosis and medium arthritis in the low back.

## 2023-05-08 ENCOUNTER — Other Ambulatory Visit: Payer: Self-pay | Admitting: Family Medicine

## 2023-05-19 ENCOUNTER — Ambulatory Visit: Payer: Self-pay

## 2023-05-19 NOTE — Telephone Encounter (Signed)
 Please schedule an office visit with PCP soon  Appt for breast mass

## 2023-05-19 NOTE — Telephone Encounter (Signed)
 Please see triage note and pt request; please advise on referral or needing an in office visit prior

## 2023-05-19 NOTE — Telephone Encounter (Signed)
  Chief Complaint: lump - wanting mammogram Symptoms: lump right breast, slight tenderness- describes is as soft not hard Frequency: yesterday Pertinent Negatives: Patient denies nippple discharge, fever, Disposition: [] ED /[] Urgent Care (no appt availability in office) / [] Appointment(In office/virtual)/ []  [facility]/ [] Home Care/ [] Refused Recommended Disposition /[]  Mobile Bus/ [x]  Follow-up with PCP Additional Notes: pt would like referral to get mammogram Offered and appt but pt stated that  she was told that her PCP could call for the order. Please advise pt.   Message from [place] sent at 05/19/2023  9:33 AM EDT  Summary: Right Breast   Copied From CRM [id number]. Reason for Triage: Patient is calling to have a referral to Breast imaging, states she feels something in her right breast. Sore to the touch.   [place] [phone]         Reason for Disposition  Breast lump  Answer Assessment - Initial Assessment Questions 1. SYMPTOM: "What's the main symptom you're concerned about?"  (e.g., lump, pain, rash, nipple discharge)     lup 2. LOCATION: "Where is the lump located?"     Right breast  3. ONSET: "When did lump   start?"     Yesterday  4. PRIOR HISTORY: "Do you have any history of prior problems with your breasts?" (e.g., lumps, cancer, fibrocystic breast disease)     Dense tissue  6. OTHER SYMPTOMS: "Do you have any other symptoms?" (e.g., fever, breast pain, redness or rash, nipple discharge)     no  Protocols used: Breast Symptoms-A-AH

## 2023-05-19 NOTE — Telephone Encounter (Signed)
 We can order diagnostic mammogram and ultrasound however insurance may require office visit first.  [name]. Jimmey Ralph, MD 05/19/2023 12:52 PM

## 2023-05-20 ENCOUNTER — Encounter: Payer: Self-pay | Admitting: Family Medicine

## 2023-05-20 ENCOUNTER — Ambulatory Visit (INDEPENDENT_AMBULATORY_CARE_PROVIDER_SITE_OTHER): Admitting: Family Medicine

## 2023-05-20 VITALS — BP 150/85 | HR 89 | Temp 97.2°F | Ht 64.0 in | Wt 136.0 lb

## 2023-05-20 DIAGNOSIS — I10 Essential (primary) hypertension: Secondary | ICD-10-CM

## 2023-05-20 DIAGNOSIS — F419 Anxiety disorder, unspecified: Secondary | ICD-10-CM | POA: Diagnosis not present

## 2023-05-20 DIAGNOSIS — M5440 Lumbago with sciatica, unspecified side: Secondary | ICD-10-CM | POA: Diagnosis not present

## 2023-05-20 DIAGNOSIS — N951 Menopausal and female climacteric states: Secondary | ICD-10-CM | POA: Diagnosis not present

## 2023-05-20 DIAGNOSIS — G8929 Other chronic pain: Secondary | ICD-10-CM

## 2023-05-20 MED ORDER — AMLODIPINE BESY-BENAZEPRIL HCL 5-10 MG PO CAPS: 1.0000 | ORAL_CAPSULE | Freq: Every day | ORAL | 3 refills | Status: DC

## 2023-05-20 NOTE — Assessment & Plan Note (Signed)
 Almost worse medicine.  She is on gabapentin 300 mg nightly which seems to be working well.

## 2023-05-20 NOTE — Patient Instructions (Signed)
 It was very nice to see you today!  We will go back-year-old blood pressure medication.  I will refill this today.  Lease work on exercises for your shoulder.  Take your Celebrex.  Let us know if not improving in the next 1 to 2 weeks.  Please let us know if the pain in your breast does not improve.  It is okay for you to stay off of the Celexa for now.  Return if symptoms worsen or fail to improve.   Take care, Dr Jimmey Ralph  PLEASE NOTE:  If you had any lab tests, please let us know if you have not heard back within a few days. You may see your results on mychart before we have a chance to review them but we will give you a call once they are reviewed by [place].   If we ordered any referrals today, please let us know if you have not heard from their office within the next week.   If you had any urgent prescriptions sent in today, please check with the pharmacy within an hour of our visit to make sure the prescription was transmitted appropriately.   Please try these tips to maintain a healthy lifestyle:  Eat at least 3 REAL meals and 1-2 snacks per day.  Aim for no more than 5 hours between eating.  If you eat breakfast, please do so within one hour of getting up.   Each meal should contain half fruits/vegetables, one quarter protein, and one quarter carbs (no bigger than a computer mouse)  Cut down on sweet beverages. This includes juice, soda, and sweet tea.   Drink at least 1 glass of water with each meal and aim for at least 8 glasses per day  Exercise at least 150 minutes every week.

## 2023-05-20 NOTE — Assessment & Plan Note (Signed)
 Recently stopped Celexa due to concern for interaction with gabapentin.  She has done well off of this.  She will let us know if she has any recurrence of symptoms.

## 2023-05-20 NOTE — Assessment & Plan Note (Signed)
 Elevated today though she has been at goal at home.  She has done well with restarting her amlodipine benazepril 5-10.  Will refill this today.  Will also continue her HCTZ 12.5 mg daily.  She will monitor at home and let us know if persistently elevated.

## 2023-05-20 NOTE — Progress Notes (Signed)
 Linda Knight is a 83 y.o. female who presents today for an office visit.  Assessment/Plan:  New/Acute Problems: Right Shoulder / Axillary Pain  Exam consistent with rotator cuff strain likely due to overuse related to recent activities.  Symptoms are currently manageable.  She has Celebrex at home and will start this.  We discussed home exercise program and handout was given.  She can also use ice to the area as needed.  She is having some axillary.  And was concerned about breast mass.  We did offer the breast exam today however she declined.  Also declined further imaging with mammogram or ultrasound.  She will follow-up with [place] next week and let us know if symptoms are not improving.  If no improvement would consider referral back to sports medicine though she is still having breast pain will need imaging at that time.  Chronic Problems Addressed Today: Essential hypertension Elevated today though she has been at goal at home.  She has done well with restarting her amlodipine benazepril 5-10.  Will refill this today.  Will also continue her HCTZ 12.5 mg daily.  She will monitor at home and let us know if persistently elevated.  Menopausal symptoms Recently stopped Celexa due to concern for interaction with gabapentin.  She has done well off of this.  She will let us know if she has any recurrence of symptoms.  Chronic low back pain with sciatica Almost worse medicine.  She is on gabapentin 300 mg nightly which seems to be working well.  Anxiety Her mood is well-controlled off of Celexa.  She will stay off of this for now.  She will let us know if she has any change in mood and we can restart Celexa or alternative.     Subjective:  HPI:  See A/P for status of chronic conditions.  Patient is here today with right shoulder pain.  This started few days ago.  Also having some pain in her right chest and axilla as well.  She was concern for breast mass a couple of days ago however  states that she is no longer as concerned about this.  She does admit that she has been using her right shoulder much more than typical the last few days as she is working on sewing and quilt and has been doing more pulling and pushing motions with her hand and arm.  She has not tried any specific treatments for this.  Symptoms have improved the last day or so.  I last saw her about a month ago.  At our last visit, she was having some ongoing issues with amlodipine and benazepril including hypertension and leg swelling.  We discontinued amlodipine.  Fortunately she had elevated blood pressure at home and subsequently restarted her amlodipine.  She has done well with combination that she was on previously with amlodipine, enalapril, and HCTZ and would like to resume this.  Additionally, since her last visit she has been following with sports medicine for low back pain.  She had epidural steroid injection and was started on gabapentin.  Symptoms have improved significantly however she was concerned that the gabapentin was interacting with one of her medications.  Started developing nausea, decreased appetite, and dry mouth.  She was concerned this was due to an interaction with her Celexa and she discontinued this several days ago.  She has done well without the Celexa.  Mood has been well-controlled.       Objective:  Physical Exam:  BP (!) 150/85   Pulse 89   Temp (!) 97.2 F (36.2 C) (Temporal)   Ht 5\' 4"  (1.626 m)   Wt 136 lb (61.7 kg)   SpO2 100%   BMI 23.34 kg/m   Gen: No acute distress, resting comfortably CV: Regular rate and rhythm with no murmurs appreciated Pulm: Normal work of breathing, clear to auscultation bilaterally with no crackles, wheezes, or rhonchi MUSCULOSKELETAL: - Right Arm: No deformities.  Pain elicited with resisted supraspinatus testing and resisted external rotation.  Neurovascular intact distally.  Positive Neer and Hawking test. Breast: Deferred. Neuro: Grossly  normal, moves all extremities Psych: Normal affect and thought content      Eniola Cerullo M. Jimmey Ralph, MD 05/20/2023 12:44 PM

## 2023-05-20 NOTE — Assessment & Plan Note (Signed)
 Her mood is well-controlled off of Celexa.  She will stay off of this for now.  She will let us know if she has any change in mood and we can restart Celexa or alternative.

## 2023-06-14 ENCOUNTER — Other Ambulatory Visit: Payer: Self-pay | Admitting: Family Medicine

## 2023-06-15 ENCOUNTER — Other Ambulatory Visit: Payer: Self-pay | Admitting: Internal Medicine

## 2023-07-05 ENCOUNTER — Other Ambulatory Visit: Payer: Self-pay | Admitting: Family Medicine

## 2023-07-05 MED ORDER — ZOLPIDEM TARTRATE ER 12.5 MG PO TBCR: 12.5000 mg | EXTENDED_RELEASE_TABLET | Freq: Every evening | ORAL | 5 refills | Status: DC | PRN

## 2023-07-05 NOTE — Telephone Encounter (Signed)
 Copied from CRM [id number]. Topic: Clinical - Medication Refill >> Jul 05, 2023  9:16 AM Carlatta H wrote: Medication: zolpidem  (AMBIEN  CR) 12.5 MG CR tablet  Has the patient contacted their pharmacy? No (Agent: If no, request that the patient contact the pharmacy for the refill. If patient does not wish to contact the pharmacy document the reason why and proceed with request.) (Agent: If yes, when and what did the pharmacy advise?)  This is the patient's preferred pharmacy:  [facility] [phone] - [place], [place] - [address] AT [facility] [address] [place] [address] Phone: [phone] Fax: [phone]  Is this the correct pharmacy for this prescription? Yes If no, delete pharmacy and type the correct one.   Has the prescription been filled recently? No  Is the patient out of the medication? Yes  Has the patient been seen for an appointment in the last year OR does the patient have an upcoming appointment? No  Can we respond through MyChart? No  Agent: Please be advised that Rx refills may take up to 3 business days. We ask that you follow-up with your pharmacy.

## 2023-08-22 ENCOUNTER — Ambulatory Visit (INDEPENDENT_AMBULATORY_CARE_PROVIDER_SITE_OTHER): Admitting: Family Medicine

## 2023-08-22 ENCOUNTER — Encounter: Payer: Self-pay | Admitting: Family Medicine

## 2023-08-22 VITALS — BP 134/79 | HR 82 | Temp 97.5°F | Ht 64.0 in | Wt 135.0 lb

## 2023-08-22 DIAGNOSIS — N390 Urinary tract infection, site not specified: Secondary | ICD-10-CM

## 2023-08-22 DIAGNOSIS — I10 Essential (primary) hypertension: Secondary | ICD-10-CM | POA: Diagnosis not present

## 2023-08-22 DIAGNOSIS — K8689 Other specified diseases of pancreas: Secondary | ICD-10-CM | POA: Diagnosis not present

## 2023-08-22 LAB — POCT URINALYSIS DIPSTICK
Bilirubin, UA: NEGATIVE
Blood, UA: NEGATIVE
Glucose, UA: NEGATIVE
Ketones, UA: NEGATIVE
Nitrite, UA: NEGATIVE
Protein, UA: NEGATIVE
Spec Grav, UA: 1.01 (ref 1.010–1.025)
Urobilinogen, UA: 0.2 U/dL
pH, UA: 6 (ref 5.0–8.0)

## 2023-08-22 MED ORDER — PANCRELIPASE (LIP-PROT-AMYL) 36000-114000 UNITS PO CPEP: ORAL_CAPSULE | ORAL | 11 refills | Status: DC

## 2023-08-22 MED ORDER — NITROFURANTOIN MONOHYD MACRO 100 MG PO CAPS: 100.0000 mg | ORAL_CAPSULE | Freq: Two times a day (BID) | ORAL | 0 refills | Status: DC

## 2023-08-22 NOTE — Assessment & Plan Note (Signed)
 She has had a mild flareup recently since being off of the Creon .  Will restart this today.  It is possible that her frequent loose stools may be contributing to her above UTI.  She will need to follow back up with GI if this continues to be an issue despite restarting the Creon .

## 2023-08-22 NOTE — Patient Instructions (Signed)
 It was very nice to see you today!  I think you probably have a urinary tract infection.  Please start the Macrobid  while we wait on your urine culture results.  I will refill your Creon  as well.  Let us  know if not improving.  Return if symptoms worsen or fail to improve.   Take care, Dr Kennyth  PLEASE NOTE:  If you had any lab tests, please let us  know if you have not heard back within a few days. You may see your results on mychart before we have a chance to review them but we will give you a call once they are reviewed by us .   If we ordered any referrals today, please let us  know if you have not heard from their office within the next week.   If you had any urgent prescriptions sent in today, please check with the pharmacy within an hour of our visit to make sure the prescription was transmitted appropriately.   Please try these tips to maintain a healthy lifestyle:  Eat at least 3 REAL meals and 1-2 snacks per day.  Aim for no more than 5 hours between eating.  If you eat breakfast, please do so within one hour of getting up.   Each meal should contain half fruits/vegetables, one quarter protein, and one quarter carbs (no bigger than a computer mouse)  Cut down on sweet beverages. This includes juice, soda, and sweet tea.   Drink at least 1 glass of water with each meal and aim for at least 8 glasses per day  Exercise at least 150 minutes every week.

## 2023-08-22 NOTE — Assessment & Plan Note (Addendum)
 At goal today on amlodipine  benazepril  5-10 and HCTZ 12.5 mg daily.

## 2023-08-22 NOTE — Progress Notes (Signed)
   Linda Knight is a 83 y.o. female who presents today for an office visit.  Assessment/Plan:  New/Acute Problems: Urinary tract infection History and UA consistent with urinary tract infection.  No red flags or signs of systemic illness.  We will empirically start Macrobid  while we await culture results.  Encouraged hydration.  We discussed reasons to return to care.  Follow-up as needed.  Chronic Problems Addressed Today: Pancreatic insufficiency She has had a mild flareup recently since being off of the Creon .  Will restart this today.  It is possible that her frequent loose stools may be contributing to her above UTI.  She will need to follow back up with GI if this continues to be an issue despite restarting the Creon .  Essential hypertension At goal today on amlodipine  benazepril  5-10 and HCTZ 12.5 mg daily.     Subjective:  HPI:  See Assessment / plan for status of chronic conditions.  Patient is here today with concern for UTI for the last week.  Symptoms include dysuria and hesitancy.  She has also had some back pain and vaginal pain as well.  No reported fevers or chills. She has noticed more loose stools which she attributes to her pancreatic insufficiency.        Objective:  Physical Exam: BP 134/79   Pulse 82   Temp (!) 97.5 F (36.4 C) (Temporal)   Ht 5' 4 (1.626 m)   Wt 135 lb (61.2 kg)   SpO2 99%   BMI 23.17 kg/m   Gen: No acute distress, resting comfortably CV: Regular rate and rhythm with no murmurs appreciated Pulm: Normal work of breathing, clear to auscultation bilaterally with no crackles, wheezes, or rhonchi Neuro: Grossly normal, moves all extremities Psych: Normal affect and thought content      Kharma Sampsel M. Kennyth, MD 08/22/2023 10:38 AM

## 2023-08-23 LAB — URINE CULTURE
MICRO NUMBER:: 16694859
Result:: NO GROWTH
SPECIMEN QUALITY:: ADEQUATE

## 2023-08-24 ENCOUNTER — Ambulatory Visit: Payer: Self-pay | Admitting: Family Medicine

## 2023-08-24 NOTE — Progress Notes (Signed)
 Urine culture was negative for UTI.  She should let us  know if her symptoms are not improving.

## 2023-08-29 ENCOUNTER — Other Ambulatory Visit: Payer: Self-pay | Admitting: Family Medicine

## 2023-08-29 DIAGNOSIS — Z1231 Encounter for screening mammogram for malignant neoplasm of breast: Secondary | ICD-10-CM

## 2023-08-31 ENCOUNTER — Encounter: Payer: Self-pay | Admitting: Family Medicine

## 2023-08-31 ENCOUNTER — Telehealth: Payer: Self-pay | Admitting: *Deleted

## 2023-08-31 NOTE — Telephone Encounter (Signed)
 Copied from CRM #[id number]. Topic: General - Other >> Aug 31, 2023  1:13 PM [place] A wrote: Reason for CRM: Patient would like for Nurse to call her regarding a referral patient wants for GI. Agent informed patient that if she has not spoken to Dr.Parker regarding referral then agent could assist with scheduling-Patient states Please, just have them call me   See Previews note  Seirra Kos,RMA

## 2023-08-31 NOTE — Telephone Encounter (Signed)
**Note De-identified  [place] Obfuscation** Please advise 

## 2023-09-01 ENCOUNTER — Other Ambulatory Visit: Payer: Self-pay | Admitting: *Deleted

## 2023-09-01 DIAGNOSIS — R198 Other specified symptoms and signs involving the digestive system and abdomen: Secondary | ICD-10-CM

## 2023-09-01 NOTE — Telephone Encounter (Signed)
 Ok to place referral.  Worth [name]. Kennyth, MD 09/01/2023 8:02 AM

## 2023-09-01 NOTE — Telephone Encounter (Signed)
 Referral placed.

## 2023-09-01 NOTE — Telephone Encounter (Signed)
 Please see her MyChart message. Ok to place referral.  Worth [name]. Kennyth, MD 09/01/2023 8:04 AM

## 2023-09-07 ENCOUNTER — Ambulatory Visit
Admission: RE | Admit: 2023-09-07 | Discharge: 2023-09-07 | Disposition: A | Discharge status: Home / Self Care | Source: Ambulatory Visit | Attending: Family Medicine | Admitting: Family Medicine

## 2023-09-07 DIAGNOSIS — Z1231 Encounter for screening mammogram for malignant neoplasm of breast: Secondary | ICD-10-CM

## 2023-09-11 ENCOUNTER — Other Ambulatory Visit: Payer: Self-pay | Admitting: Family Medicine

## 2023-09-12 ENCOUNTER — Other Ambulatory Visit: Payer: Self-pay | Admitting: Family Medicine

## 2023-09-12 ENCOUNTER — Encounter: Payer: Self-pay | Admitting: Internal Medicine

## 2023-09-12 DIAGNOSIS — R928 Other abnormal and inconclusive findings on diagnostic imaging of breast: Secondary | ICD-10-CM

## 2023-09-15 ENCOUNTER — Other Ambulatory Visit

## 2023-09-15 ENCOUNTER — Ambulatory Visit
Admission: RE | Admit: 2023-09-15 | Discharge: 2023-09-15 | Disposition: A | Discharge status: Home / Self Care | Source: Ambulatory Visit | Attending: Family Medicine | Admitting: Family Medicine

## 2023-09-15 DIAGNOSIS — R928 Other abnormal and inconclusive findings on diagnostic imaging of breast: Secondary | ICD-10-CM

## 2023-09-26 ENCOUNTER — Encounter: Payer: Self-pay | Admitting: Family Medicine

## 2023-09-26 ENCOUNTER — Other Ambulatory Visit: Payer: Self-pay | Admitting: *Deleted

## 2023-09-26 MED ORDER — PANCRELIPASE (LIP-PROT-AMYL) 36000-114000 UNITS PO CPEP: ORAL_CAPSULE | ORAL | 11 refills | Status: DC

## 2023-10-13 IMAGING — MG MM DIGITAL SCREENING BILAT W/ TOMO AND CAD
8 series · 8 of 24 positions shown · non-contrast
Comparison: Previous exam(s).

CLINICAL DATA: Screening.

EXAM:
DIGITAL SCREENING BILATERAL MAMMOGRAM WITH TOMOSYNTHESIS AND CAD
TECHNIQUE: Bilateral screening digital craniocaudal and mediolateral oblique
mammograms were obtained. Bilateral screening digital breast
tomosynthesis was performed. The images were evaluated with
computer-aided detection.

[L MLO synth-2D]
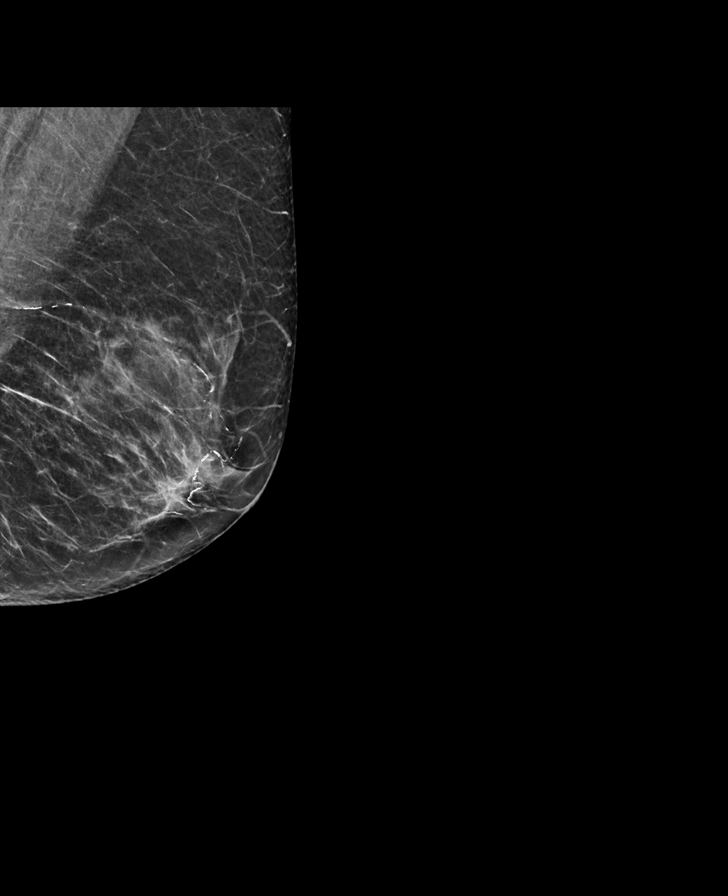

[R MLO synth-2D]
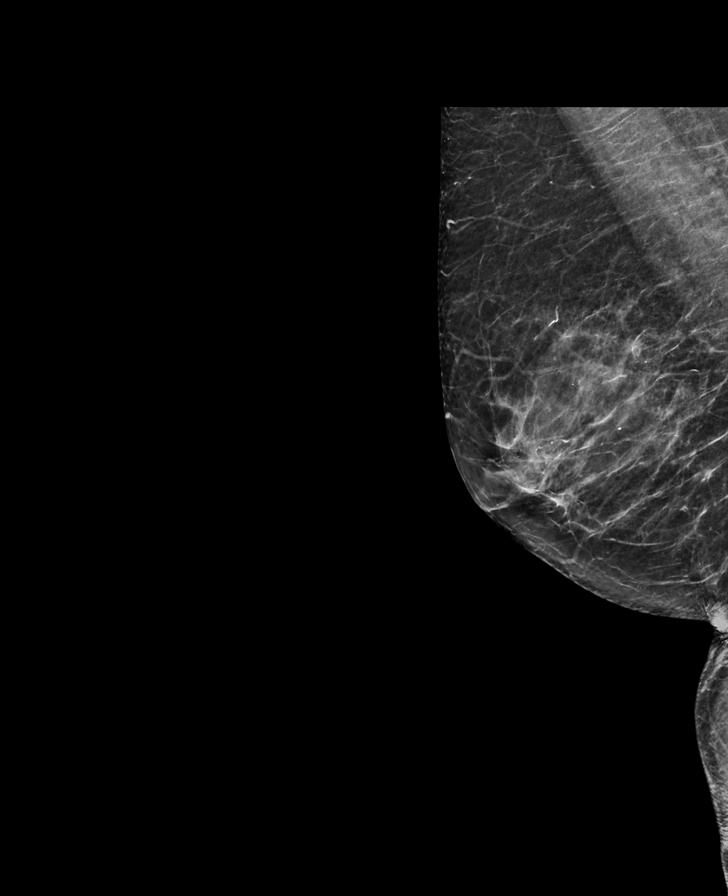

[R CC synth-2D]
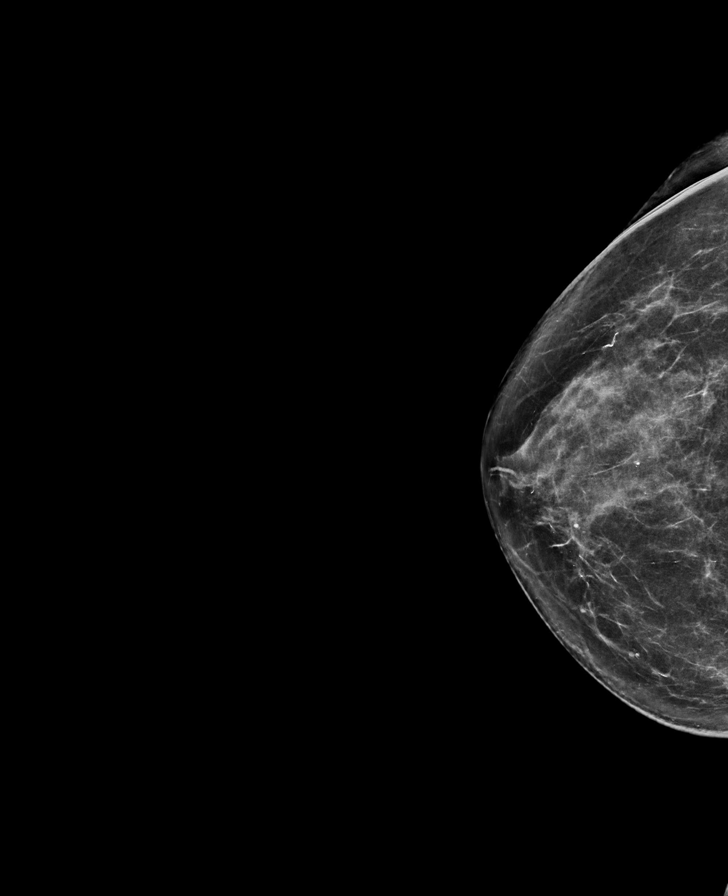

[L CC synth-2D]
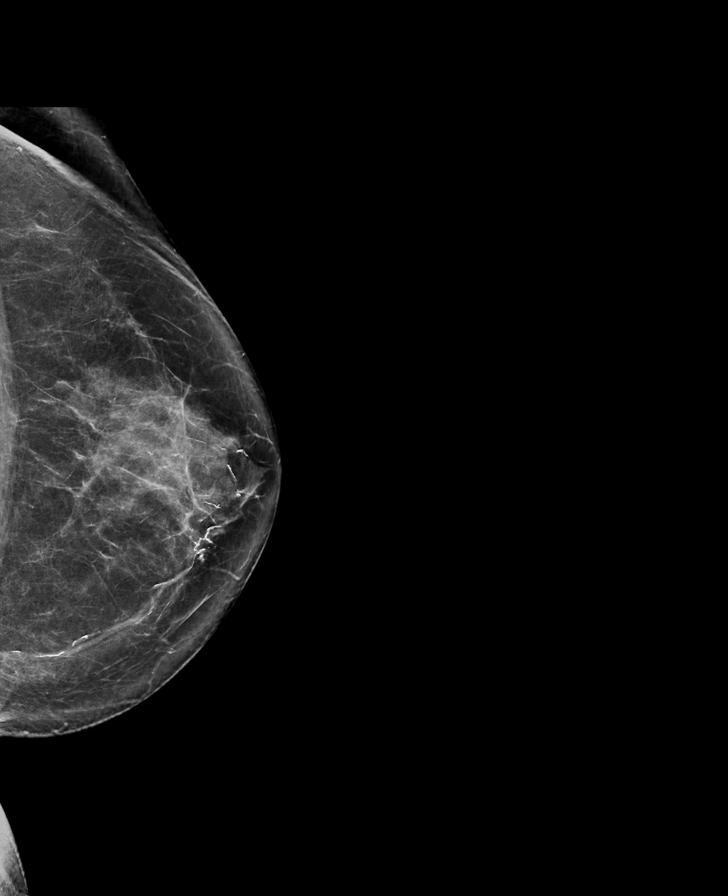

[L CC tomo · tomo slice 39/76.0]
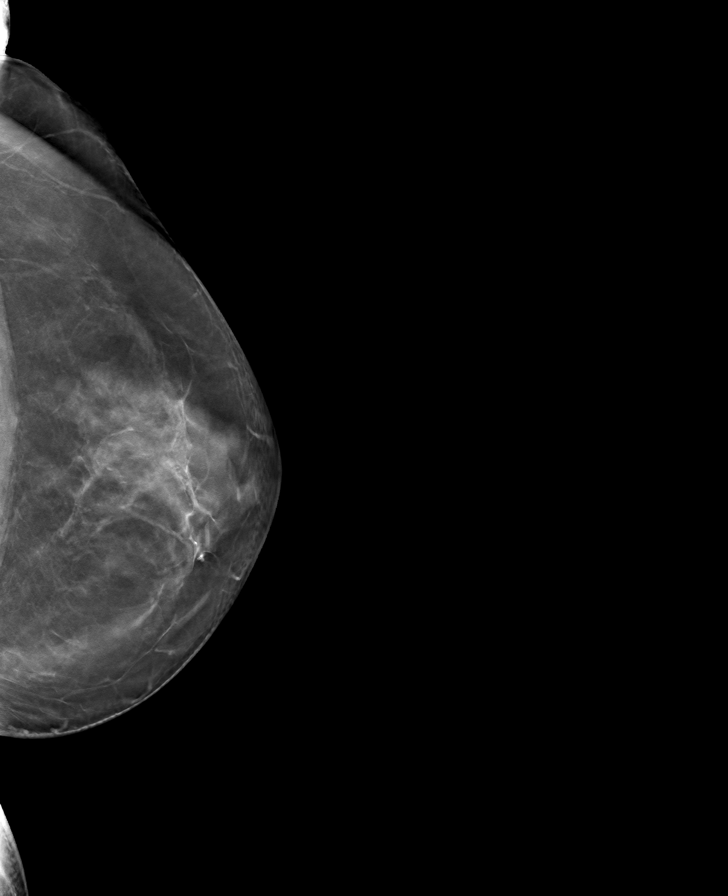

[L MLO tomo · tomo slice 32/63.0]
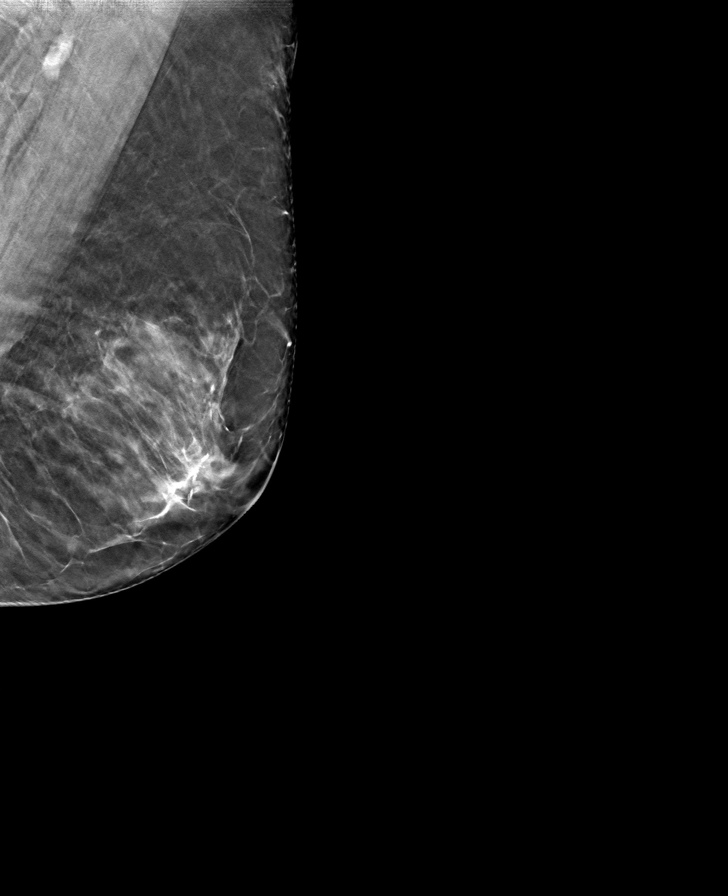

[R CC tomo · tomo slice 37/74.0]
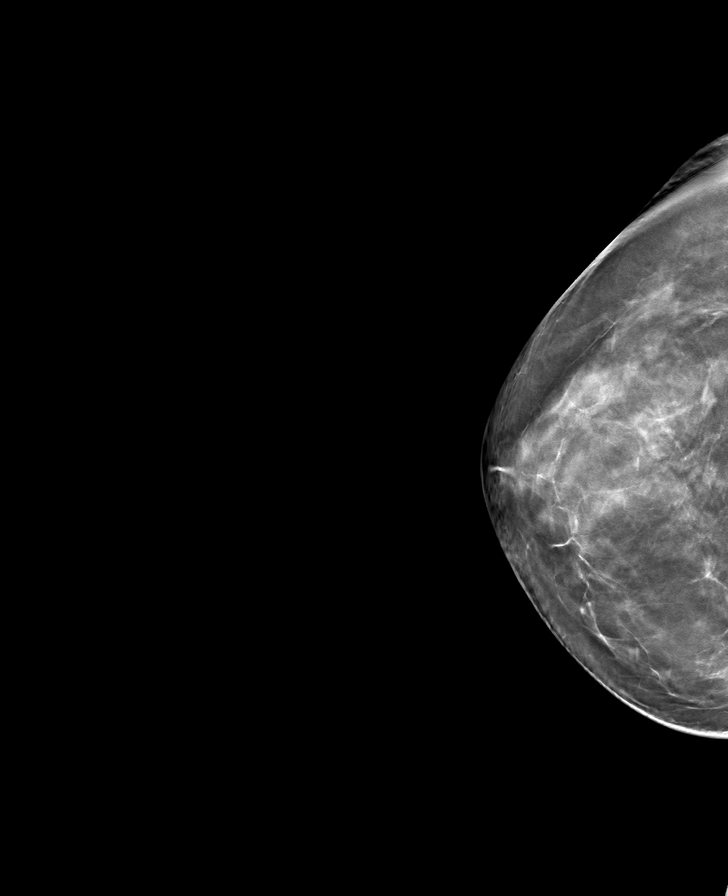

[R MLO tomo · tomo slice 31/61.0]
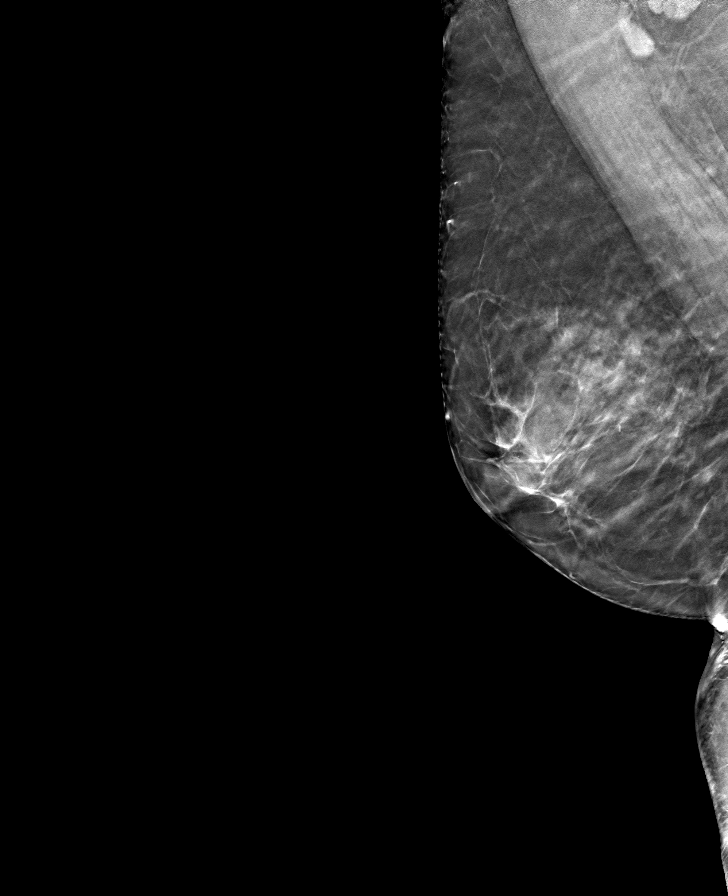

[8 of 24 positions shown; findings below may reference images not displayed]

ACR Breast Density Category c: The breast tissue is heterogeneously
dense, which may obscure small masses.
FINDINGS: There are no findings suspicious for malignancy.
IMPRESSION: No mammographic evidence of malignancy. A result letter of this
screening mammogram will be mailed directly to the patient.

RECOMMENDATION:
Screening mammogram in one year. (Code:[id number]-W-BC3)

BI-RADS CATEGORY  1: Negative.

## 2023-10-24 ENCOUNTER — Encounter: Payer: Self-pay | Admitting: Gastroenterology

## 2023-10-24 ENCOUNTER — Ambulatory Visit (INDEPENDENT_AMBULATORY_CARE_PROVIDER_SITE_OTHER): Admitting: Gastroenterology

## 2023-10-24 VITALS — BP 146/88 | HR 88 | Ht 64.0 in | Wt 136.1 lb

## 2023-10-24 DIAGNOSIS — K869 Disease of pancreas, unspecified: Secondary | ICD-10-CM

## 2023-10-24 DIAGNOSIS — K5904 Chronic idiopathic constipation: Secondary | ICD-10-CM | POA: Diagnosis not present

## 2023-10-24 DIAGNOSIS — K862 Cyst of pancreas: Secondary | ICD-10-CM | POA: Insufficient documentation

## 2023-10-24 NOTE — Patient Instructions (Signed)
 You have been scheduled for an MRI at [facility] on Wednesday 11/02/23. Your appointment time is 12 pm. Please arrive to admitting (at main entrance of the hospital) 30 minutes prior to your appointment time for registration purposes. Please make certain not to have anything to eat or drink 6 hours prior to your test. In addition, if you have any metal in your body, have a pacemaker or defibrillator, please be sure to let your ordering physician know. This test typically takes 45 minutes to 1 hour to complete. Should you need to reschedule, please call [phone] to do so.  _______________________________________________________  If your blood pressure at your visit was 140/90 or greater, please contact your primary care physician to follow up on this.  _______________________________________________________  If you are age 27 or older, your body mass index should be between 23-30. Your Body mass index is 23.37 kg/m. If this is out of the aforementioned range listed, please consider follow up with your Primary Care Provider.  If you are age 31 or younger, your body mass index should be between 19-25. Your Body mass index is 23.37 kg/m. If this is out of the aformentioned range listed, please consider follow up with your Primary Care Provider.   ________________________________________________________  The [facility] providers would like to encourage you to use MYCHART to communicate with providers for non-urgent requests or questions.  Due to long hold times on the telephone, sending your provider a message by [facility] may be a faster and more efficient way to get a response.  Please allow 48 business hours for a response.  Please remember that this is for non-urgent requests.  _______________________________________________________  [facility] is using a team-based approach to care.  Your team is made up of your doctor and two to three APPS. Our APPS (Nurse Practitioners  and Physician Assistants) work with your physician to ensure care continuity for you. They are fully qualified to address your health concerns and develop a treatment plan. They communicate directly with your gastroenterologist to care for you. Seeing the Advanced Practice Practitioners on your physician's team can help you by facilitating care more promptly, often allowing for earlier appointments, access to diagnostic testing, procedures, and other specialty referrals.

## 2023-10-24 NOTE — Progress Notes (Signed)
 10/24/2023 Linda Knight [id number] Aug 14, 1940   HISTORY OF PRESENT ILLNESS: This is an 83 year old female who is a patient of Dr. Darilyn, last seen here 06/2022.  She has complicated history of mildly dilated pancreatic duct, pancreatic cystic lesions but no chronic pancreatitis on EUS.  She is on Creon .  Also has a complicated pelvic floor history with corrective surgery through urogynecology.  She does believe that her Creon  helps abdominal pain and gas, but does have constipation at times for which she takes his MiraLAX as needed.  Last MRI/MRCP as below.  She says that recently she has been having some discomfort in her upper abdomen after she eats.  She describes it as a stomachache or irritation of her stomach.  She is concerned about her pancreas and would like to have the repeat imaging.  MRI abdomen MRCP 11/2021:  IMPRESSION: 1. Stable 7 mm cystic lesion in the head of the pancreas. No change from earliest available comparison of 08/10/2019, documenting 27 months of stability. Possibilities include postinflammatory cyst or small intraductal papillary mucinous neoplasm. Follow up pancreatic protocol MRI is recommended in 2 years time. This recommendation follows ACR consensus guidelines: Management of Incidental Pancreatic Cysts: A White Paper of the ACR Incidental Findings Committee. J Am Coll Radiol 2017;14:911-923. 2. Stable 2.4 cm cyst in the dome of the liver, cannot exclude cystadenoma based on mild internal septation, but no internal nodularity or progression. Other tiny hepatic cysts appear benign. 3. 0.7 cm Bosniak category 1 cyst of the left kidney lower pole posteriorly. This requires no further workup. 4. Levoconvex lumbar scoliosis with rotary component and partial butterfly vertebra T10.  Pancreatic insufficiency    - recurrent symptoms after stopping Creon  Pancreatic cyst and dilated pancreatic duct on recent CT and MRI    - normal pancreas on CT  in [facility] in 2019    - Abnormal pancreas on CT 07/05/19    - Abnormal pancreas on MRI 08/10/19    - EGD/EUS 09/27/19:        - small 5-60mm pancreatic cysts (body and head). No solid lesions. Nl PD.     - MRI/MRCP 10/30/20: no change    - MRI/MRCP 11/24/21: overall stable History of altered bowel habits following colpopexy and bladder repair 10/21    - no improvement with Xifaxan  x 14 days for possible SIBO Periampullary duodenal diverticulum Abnormal MR lumbar spine 2021 Elevated lipase 07/2019 Recent abdominal pain, resolved with dietary changes Daily alcohol use, with history of heavier use Glucose intolerance Chronic constipation treated with Miralax prn and magnesium Adenomyomatosis of the gallbladder fundus. Prior screening colonoscopy in [facility]  Former smoker   Past Medical History:  Diagnosis Date   Anemia    Arthritis    CAD (coronary artery disease)    High cholesterol    Hypertension    Pancreatic cyst    SCCA (squamous cell carcinoma) of skin 2019   Right Arm [facility])   Sleep apnea    Squamous cell carcinoma of skin 2019   Left Leg [facility])   Thyroid  disease    Past Surgical History:  Procedure Laterality Date   ABDOMINAL HYSTERECTOMY     APPENDECTOMY     BIOPSY  09/27/2019   Procedure: BIOPSY;  Surgeon: Teressa Toribio SQUIBB, MD;  Location: [facility];  Service: Endoscopy;;   BREAST EXCISIONAL BIOPSY Right 1993   BUNIONECTOMY     carcinoma removed Right 09/2020   CYSTOCELE REPAIR  bladder repair   ESOPHAGOGASTRODUODENOSCOPY (EGD) WITH PROPOFOL  N/A 09/27/2019   Procedure: ESOPHAGOGASTRODUODENOSCOPY (EGD) WITH PROPOFOL ;  Surgeon: Teressa Toribio SQUIBB, MD;  Location: [facility];  Service: Endoscopy;  Laterality: N/A;   EUS N/A 09/27/2019   Procedure: UPPER ENDOSCOPIC ULTRASOUND (EUS) RADIAL;  Surgeon: Teressa Toribio SQUIBB, MD;  Location: [facility];  Service: Endoscopy;  Laterality: N/A;   KNEE ARTHROSCOPY  06/2021   TYMPANOPLASTY Left 06/2020   VAGINAL  PROLAPSE REPAIR  12/07/2019    reports that she quit smoking about 43 years ago. Her smoking use included cigarettes. She has never used smokeless tobacco. She reports that she does not currently use alcohol. She reports that she does not use drugs. family history includes Breast cancer (age of onset: 15 55 1) in her cousin; Heart disease in her brother; Hyperlipidemia in her mother; Hypertension in her mother; Lung disease in her father; Stroke in her mother. Allergies  Allergen Reactions   Tape Rash      Outpatient Encounter Medications as of 10/24/2023  Medication Sig   amLODipine -benazepril  (LOTREL) 5-10 MG capsule Take 1 capsule by mouth daily.   aspirin EC 81 MG tablet Take 81 mg by mouth daily. Swallow whole.   atorvastatin  (LIPITOR) 20 MG tablet Take 1 tablet (20 mg total) by mouth daily.   b complex vitamins capsule Take 1 capsule by mouth daily.   Cholecalciferol  (VITAMIN D3) 50 MCG (2000 UT) capsule Take 2,000 Units by mouth daily.   conjugated estrogens  (PREMARIN ) vaginal cream Place 1 Applicatorful vaginally once a week.   hydrochlorothiazide  (HYDRODIURIL ) 25 MG tablet TAKE 1/2 TABLET BY MOUTH EVERY DAY   KLOR-CON  M20 20 MEQ tablet TAKE 1 TABLET BY MOUTH TWICE A DAY   levothyroxine  (SYNTHROID ) 50 MCG tablet TAKE 1 TABLET BY MOUTH EVERY DAY   lipase/protease/amylase (CREON ) 36000 UNITS CPEP capsule Take 2 capsules (72,000 Units total) by mouth 3 (three) times daily with meals. May also take 1 capsule (36,000 Units total) as needed (with snacks - up to 4 snacks daily).   magnesium oxide (MAG-OX) 400 (240 Mg) MG tablet Take 400 mg by mouth daily.   zolpidem  (AMBIEN  CR) 12.5 MG CR tablet Take 1 tablet (12.5 mg total) by mouth at bedtime as needed for sleep.   metoprolol  tartrate (LOPRESSOR ) 25 MG tablet Take 0.5 tablets (12.5 mg total) by mouth 2 (two) times daily.   [DISCONTINUED] celecoxib  (CELEBREX ) 200 MG capsule TAKE 1 CAPSULE BY MOUTH TWICE A DAY   [DISCONTINUED]  nitrofurantoin , macrocrystal-monohydrate, (MACROBID ) 100 MG capsule Take 1 capsule (100 mg total) by mouth 2 (two) times daily.   No facility-administered encounter medications on file as of 10/24/2023.    REVIEW OF SYSTEMS  : All other systems reviewed and negative except where noted in the History of Present Illness.   PHYSICAL EXAM: BP (!) 146/88 (BP Location: Right Arm, Patient Position: Sitting, Cuff Size: Normal)   Pulse 88   Ht 5' 4 (1.626 m)   Wt 136 lb 2 oz (61.7 kg)   BMI 23.37 kg/m  General: Well developed white female in no acute distress Head: Normocephalic and atraumatic Eyes:  Sclerae anicteric, conjunctiva pink. Ears: Normal auditory acuity Lungs: Clear throughout to auscultation; no W/R/R. Heart: Regular rate and rhythm; no M/R/G. Abdomen: Soft, non-distended.  BS present.  Mild upper abdominal TTP. Musculoskeletal: Symmetrical with no gross deformities  Skin: No lesions on visible extremities Extremities: No edema  Neurological: Alert oriented x 4, grossly non-focal Psychological:  Alert and cooperative. Normal  mood and affect  ASSESSMENT AND PLAN: *Pancreatic insufficiency: Is on Creon  72,000 units with meals and 36,000 units with snacks. *Constipation: Has some constipation so she is taking MiraLAX as needed which seems to help. *Pancreatic cyst: Last imaging was October 2023 with MRI abdomen/MRCP with repeat recommended again in 2 years.  Describes some discomfort in her upper abdomen after eating that she describes as a stomachache.  - Will plan for MRI abdomen/MRCP. - Pending the results of that, if lesion appears stable consider if her symptoms are more dietary related.  Could also consider trial of PPI or famotidine for gastritis or reflux type symptoms.   CC:  Kennyth Worth [name], MD

## 2023-11-02 ENCOUNTER — Other Ambulatory Visit: Payer: Self-pay | Admitting: Gastroenterology

## 2023-11-02 ENCOUNTER — Ambulatory Visit (HOSPITAL_COMMUNITY)
Admission: RE | Admit: 2023-11-02 | Discharge: 2023-11-02 | Disposition: A | Discharge status: Home / Self Care | Source: Ambulatory Visit | Attending: Gastroenterology | Admitting: Gastroenterology

## 2023-11-02 DIAGNOSIS — K862 Cyst of pancreas: Secondary | ICD-10-CM

## 2023-11-02 MED ORDER — GADOBUTROL 1 MMOL/ML IV SOLN
6.0000 mL | Freq: Once | INTRAVENOUS | Status: AC | PRN
  Administered 2023-11-02: 6 mL via INTRAVENOUS

## 2023-11-03 ENCOUNTER — Ambulatory Visit: Payer: Self-pay | Admitting: Gastroenterology

## 2023-11-10 ENCOUNTER — Other Ambulatory Visit: Payer: Self-pay | Admitting: *Deleted

## 2023-11-10 MED ORDER — AMLODIPINE BESY-BENAZEPRIL HCL 5-10 MG PO CAPS: 1.0000 | ORAL_CAPSULE | Freq: Every day | ORAL | 3 refills | Status: DC

## 2023-11-22 ENCOUNTER — Ambulatory Visit: Admitting: Family Medicine

## 2023-11-22 ENCOUNTER — Encounter: Payer: Self-pay | Admitting: Family Medicine

## 2023-11-22 VITALS — BP 130/70 | HR 67 | Temp 97.2°F | Ht 64.0 in | Wt 140.8 lb

## 2023-11-22 DIAGNOSIS — Z23 Encounter for immunization: Secondary | ICD-10-CM

## 2023-11-22 DIAGNOSIS — E039 Hypothyroidism, unspecified: Secondary | ICD-10-CM

## 2023-11-22 DIAGNOSIS — I10 Essential (primary) hypertension: Secondary | ICD-10-CM

## 2023-11-22 DIAGNOSIS — R600 Localized edema: Secondary | ICD-10-CM | POA: Diagnosis not present

## 2023-11-22 DIAGNOSIS — K5900 Constipation, unspecified: Secondary | ICD-10-CM | POA: Diagnosis not present

## 2023-11-22 LAB — COMPREHENSIVE METABOLIC PANEL WITH GFR
ALT: 20 U/L (ref 0–35)
AST: 29 U/L (ref 0–37)
Albumin: 4.5 g/dL (ref 3.5–5.2)
Alkaline Phosphatase: 86 U/L (ref 39–117)
BUN: 14 mg/dL (ref 6–23)
CO2: 29 meq/L (ref 19–32)
Calcium: 9.1 mg/dL (ref 8.4–10.5)
Chloride: 94 meq/L — ABNORMAL LOW (ref 96–112)
Creatinine, Ser: 1.15 mg/dL (ref 0.40–1.20)
GFR: 44.58 mL/min — ABNORMAL LOW (ref >60.00)
Glucose, Bld: 107 mg/dL — ABNORMAL HIGH (ref 70–99)
Potassium: 3.4 meq/L — ABNORMAL LOW (ref 3.5–5.1)
Sodium: 132 meq/L — ABNORMAL LOW (ref 135–145)
Total Bilirubin: 0.5 mg/dL (ref 0.2–1.2)
Total Protein: 7.5 g/dL (ref 6.0–8.3)

## 2023-11-22 LAB — CBC
HCT: 30.7 % — ABNORMAL LOW (ref 36.0–46.0)
Hemoglobin: 10.7 g/dL — ABNORMAL LOW (ref 12.0–15.0)
MCHC: 34.8 g/dL (ref 30.0–36.0)
MCV: 96.4 fl (ref 78.0–100.0)
Platelets: 236 K/uL (ref 150.0–400.0)
RBC: 3.19 Mil/uL — ABNORMAL LOW (ref 3.87–5.11)
RDW: 12.8 % (ref 11.5–15.5)
WBC: 4.7 K/uL (ref 4.0–10.5)

## 2023-11-22 LAB — TSH: TSH: 7.94 u[IU]/mL — ABNORMAL HIGH (ref 0.35–5.50)

## 2023-11-22 MED ORDER — BENAZEPRIL HCL 20 MG PO TABS: 20.0000 mg | ORAL_TABLET | Freq: Every day | ORAL | 3 refills | Status: DC

## 2023-11-22 NOTE — Assessment & Plan Note (Signed)
 Patient is not had much success with over-the-counter Benefiber and MiraLAX.  Recommended she double up her dose of MiraLAX.  If no improvement in the next 1 to 2 days she can try glycerin suppository.  We did discuss trial of prescription laxative such as Linzess or Amitiza however she declined for today.  She will let us  know if not improving in the next few days.

## 2023-11-22 NOTE — Patient Instructions (Addendum)
 It was very nice to see you today!  VISIT SUMMARY: You visited us  today due to leg pain and swelling, along with constipation and high blood pressure. We discussed your medications and made some adjustments to help manage your symptoms. You also received a flu shot during your visit.  YOUR PLAN: BILATERAL LOWER EXTREMITY EDEMA: Your leg swelling is likely caused by one of your blood pressure medications, amlodipine . -Stop taking amlodipine  and start taking benazepril  as a separate medication. -Continue taking hydrochlorothiazide . -We will do blood work to check your kidney function, electrolytes, and thyroid  function. -Monitor your symptoms and follow up in 1-2 weeks.  HYPERTENSION: Your high blood pressure needs to be managed with a new medication plan. -Start taking benazepril  as a separate medication. -Continue taking hydrochlorothiazide . -Monitor your blood pressure after the medication adjustment.  CONSTIPATION: You have been experiencing constipation despite taking Benefiber and Miralax. -We will do blood work to check your thyroid  and electrolytes. -Try taking a double dose of Miralax. -Consider using an over-the-counter glycerin suppository if there is no improvement.  ENCOUNTER FOR IMMUNIZATION: You received a flu shot during your visit. -You received a flu shot today.  Return if symptoms worsen or fail to improve.   Take care, Dr Kennyth  PLEASE NOTE:  If you had any lab tests, please let us  know if you have not heard back within a few days. You may see your results on mychart before we have a chance to review them but we will give you a call once they are reviewed by us .   If we ordered any referrals today, please let us  know if you have not heard from their office within the next week.   If you had any urgent prescriptions sent in today, please check with the pharmacy within an hour of our visit to make sure the prescription was transmitted appropriately.   Please try  these tips to maintain a healthy lifestyle:  Eat at least 3 REAL meals and 1-2 snacks per day.  Aim for no more than 5 hours between eating.  If you eat breakfast, please do so within one hour of getting up.   Each meal should contain half fruits/vegetables, one quarter protein, and one quarter carbs (no bigger than a computer mouse)  Cut down on sweet beverages. This includes juice, soda, and sweet tea.   Drink at least 1 glass of water with each meal and aim for at least 8 glasses per day  Exercise at least 150 minutes every week.

## 2023-11-22 NOTE — Progress Notes (Signed)
 Linda Knight is a 83 y.o. female who presents today for an office visit.  Assessment/Plan:   Chronic Problems Addressed Today: Leg edema No red flags.  Likely due to amlodipine  we will discontinue this today.  Will increase benazepril  to 20 mg daily.  She can continue HCTZ 12.5 mg daily.  Will check labs today.  She will check in with us  in a couple of weeks if not improving.  Essential hypertension Blood pressure is at goal today however we are discontinuing her amlodipine  due to leg edema.  Will increase her benazepril  to 20 mg daily.  Continue HCTZ 12.5 mg daily.  Check labs.  She will monitor at home and follow-up with us  in a few weeks via MyChart.  Constipation Patient is not had much success with over-the-counter Benefiber and MiraLAX.  Recommended she double up her dose of MiraLAX.  If no improvement in the next 1 to 2 days she can try glycerin suppository.  We did discuss trial of prescription laxative such as Linzess or Amitiza however she declined for today.  She will let us  know if not improving in the next few days.  Hypothyroidism Check TSH with labs.  Flu vaccine given today.    Subjective:  HPI:  See assessment / plan for status of chronic conditions.   Discussed the use of AI scribe software for clinical note transcription with the patient, who gave verbal consent to proceed.  History of Present Illness Linda Knight is an 83 year old female who presents with leg pain and swelling.  She has been experiencing leg swelling for the past two to three weeks, initially occurring in the evenings and improving with elevation. The swelling has progressed to involve her entire leg, causing a sensation of tightness. The swelling is more pronounced in the ankles and affects both legs, with one leg swelling more than the other.  No reported shortness of breath or orthopnea.  She is currently taking Celebrex , which she started recently, but has not noticed  significant improvement in her symptoms. Her medication regimen also includes a combination blood pressure medication containing amlodipine  and benazepril , and a diuretic, hydrochlorothiazide . Her blood pressure increased to 160/90 mmHg when she attempted to stop the amlodipine , which she has been on for a long time.  She experiences constipation, for which she has been taking Benefiber and Miralax for the past two days without relief. She has tried both a full dose and a double dose of Miralax without success.  No recent changes in medications aside from starting Celebrex  and no identified specific triggers for her symptoms. She reports pain associated with the swelling by evening time.         Objective:  Physical Exam: BP 130/70   Pulse 67   Temp (!) 97.2 F (36.2 C) (Temporal)   Ht 5' 4 (1.626 m)   Wt 140 lb 12.8 oz (63.9 kg)   SpO2 98%   BMI 24.17 kg/m   Wt Readings from Last 3 Encounters:  11/22/23 140 lb 12.8 oz (63.9 kg)  10/24/23 136 lb 2 oz (61.7 kg)  08/22/23 135 lb (61.2 kg)    Gen: No acute distress, resting comfortably CV: Regular rate and rhythm with no murmurs appreciated Pulm: Normal work of breathing, clear to auscultation bilaterally with no crackles, wheezes, or rhonchi MUSCULOSKELETAL: Bilateral lower extremities with 1+ pitting edema to knees.  Few scattered varicosities noted.  Neurovascular intact distally. Neuro: Grossly normal, moves all extremities Psych:  Normal affect and thought content      Cannon Quinton M. Kennyth, MD 11/22/2023 12:34 PM

## 2023-11-22 NOTE — Assessment & Plan Note (Signed)
Check TSH with labs. 

## 2023-11-22 NOTE — Assessment & Plan Note (Signed)
 Blood pressure is at goal today however we are discontinuing her amlodipine  due to leg edema.  Will increase her benazepril  to 20 mg daily.  Continue HCTZ 12.5 mg daily.  Check labs.  She will monitor at home and follow-up with us  in a few weeks via MyChart.

## 2023-11-22 NOTE — Assessment & Plan Note (Signed)
 No red flags.  Likely due to amlodipine  we will discontinue this today.  Will increase benazepril  to 20 mg daily.  She can continue HCTZ 12.5 mg daily.  Will check labs today.  She will check in with us  in a couple of weeks if not improving.

## 2023-11-25 ENCOUNTER — Ambulatory Visit: Payer: Self-pay | Admitting: Family Medicine

## 2023-11-25 ENCOUNTER — Other Ambulatory Visit: Payer: Self-pay

## 2023-11-25 ENCOUNTER — Telehealth: Payer: Self-pay

## 2023-11-25 DIAGNOSIS — R899 Unspecified abnormal finding in specimens from other organs, systems and tissues: Secondary | ICD-10-CM

## 2023-11-25 DIAGNOSIS — E039 Hypothyroidism, unspecified: Secondary | ICD-10-CM

## 2023-11-25 MED ORDER — LEVOTHYROXINE SODIUM 75 MCG PO TABS: 75.0000 ug | ORAL_TABLET | Freq: Every day | ORAL | 0 refills | Status: DC

## 2023-11-25 NOTE — Telephone Encounter (Signed)
 Copied from CRM #[id number]. Topic: Clinical - Medication Question >> Nov 25, 2023 10:26 AM Alfonso [name] wrote: Reason for CRM: patient returning call asking that the SYNTHROID  75 MG be sent over to her pharmacy.

## 2023-11-25 NOTE — Telephone Encounter (Signed)
 Future lab orders placed and Rx sent to pharmacy

## 2023-11-25 NOTE — Telephone Encounter (Signed)
 Rx sent to pharmacy [er pt request and lab result note from PCP

## 2023-11-25 NOTE — Progress Notes (Signed)
 Her thyroid  level is off.  This may explain some of her symptoms.  Can we verify that she has been taking her Synthroid  50 mcg daily?  If so recommend we increase to 75 mcg daily and recheck in 4 to 6 weeks.   Her sodium and potassium are little bit low and she is slightly anemic though these levels are stable compared to her previous numbers.  Do not think that these are contributing significantly to these symptoms she has been experiencing.  We should recheck this again in a few weeks.  Please place future orders for TSH, CBC, and c-Met.

## 2023-12-19 ENCOUNTER — Other Ambulatory Visit: Payer: Self-pay | Admitting: Family Medicine

## 2023-12-19 ENCOUNTER — Ambulatory Visit: Payer: Self-pay | Admitting: *Deleted

## 2023-12-19 NOTE — Telephone Encounter (Signed)
   FYI Only or Action Required?: Action required by provider: update on patient condition.  Patient was last seen in primary care on 11/22/2023 by Kennyth Worth [name], MD.  Called Nurse Triage reporting Leg Swelling.  Symptoms began several weeks ago.  Interventions attempted: Other: Patient has gone back to previous medication dosing.  Symptoms are: BP went up and was not controlled.  Triage Disposition: See Physician Within 24 Hours  Patient/caregiver understands and will follow disposition?: No, wishes to speak with PCP Patient has scheduled lab for tomorrow and she is ready to discuss next steps- see notes.  Copied from CRM [id number]. Topic: Clinical - Red Word Triage >> Dec 19, 2023  2:49 PM Franky GRADE wrote: Red Word that prompted transfer to Nurse Triage: Patient was seen on 11/22/2023 due to ankle and knee swelling, patient states symptoms have not improved she has ankle swelling almost every night. Reason for Disposition  [1] MODERATE leg swelling (e.g., swelling extends up to knees) AND [2] new-onset or getting worse  Answer Assessment - Initial Assessment Questions 1. ONSET: When did the swelling start? (e.g., minutes, hours, days)     Below the knee 2. LOCATION: What part of the leg is swollen?  Are both legs swollen or just one leg?     Left is worse (hx knee surgery)- bilateral 3. SEVERITY: How bad is the swelling? (e.g., localized; mild, moderate, severe)     Moderate- patient does elevate- does help 4. REDNESS: Is there redness or signs of infection?     no 5. PAIN: Is the swelling painful to touch? If Yes, ask: How painful is it?   (Scale 1-10; mild, moderate or severe)     When swollen- ache 6. FEVER: Do you have a fever? If Yes, ask: What is it, how was it measured, and when did it start?      no 7. CAUSE: What do you think is causing the leg swelling?     Medication related- patient did stop amlodapine- but BP got too high- so she stopped the  benazepril  20- and went back to her combination pill 8. MEDICAL HISTORY: Do you have a history of blood clots (e.g., DVT), cancer, heart failure, kidney disease, or liver failure?     Heart disease 9. RECURRENT SYMPTOM: Have you had leg swelling before? If Yes, ask: When was the last time? What happened that time?     Yes- OV 11/23/23 10. OTHER SYMPTOMS: Do you have any other symptoms? (e.g., chest pain, difficulty breathing)       no  Protocols used: Leg Swelling and Edema-A-AH

## 2023-12-19 NOTE — Telephone Encounter (Signed)
 Please schedule a visit with PCP for swelling on legs

## 2023-12-20 ENCOUNTER — Other Ambulatory Visit

## 2023-12-22 ENCOUNTER — Encounter (HOSPITAL_COMMUNITY): Payer: Self-pay

## 2023-12-22 ENCOUNTER — Inpatient Hospital Stay (HOSPITAL_COMMUNITY)
Admission: EM | Admit: 2023-12-22 | Discharge: 2023-12-24 | DRG: 378 | Disposition: A | Discharge status: Home / Self Care | Attending: Family Medicine | Admitting: Family Medicine

## 2023-12-22 ENCOUNTER — Encounter (HOSPITAL_COMMUNITY)
Admission: EM | Disposition: A | Discharge status: Home / Self Care | Payer: Self-pay | Source: Home / Self Care | Attending: Family Medicine

## 2023-12-22 ENCOUNTER — Emergency Department (HOSPITAL_COMMUNITY)

## 2023-12-22 ENCOUNTER — Observation Stay (HOSPITAL_COMMUNITY): Admitting: Anesthesiology

## 2023-12-22 ENCOUNTER — Ambulatory Visit: Admitting: Family Medicine

## 2023-12-22 ENCOUNTER — Other Ambulatory Visit: Payer: Self-pay

## 2023-12-22 DIAGNOSIS — R718 Other abnormality of red blood cells: Secondary | ICD-10-CM

## 2023-12-22 DIAGNOSIS — I2510 Atherosclerotic heart disease of native coronary artery without angina pectoris: Secondary | ICD-10-CM | POA: Diagnosis present

## 2023-12-22 DIAGNOSIS — E782 Mixed hyperlipidemia: Secondary | ICD-10-CM | POA: Diagnosis not present

## 2023-12-22 DIAGNOSIS — K2970 Gastritis, unspecified, without bleeding: Secondary | ICD-10-CM

## 2023-12-22 DIAGNOSIS — Z823 Family history of stroke: Secondary | ICD-10-CM

## 2023-12-22 DIAGNOSIS — K8689 Other specified diseases of pancreas: Secondary | ICD-10-CM

## 2023-12-22 DIAGNOSIS — G4733 Obstructive sleep apnea (adult) (pediatric): Secondary | ICD-10-CM | POA: Diagnosis present

## 2023-12-22 DIAGNOSIS — Z8249 Family history of ischemic heart disease and other diseases of the circulatory system: Secondary | ICD-10-CM

## 2023-12-22 DIAGNOSIS — E860 Dehydration: Secondary | ICD-10-CM | POA: Diagnosis present

## 2023-12-22 DIAGNOSIS — K274 Chronic or unspecified peptic ulcer, site unspecified, with hemorrhage: Secondary | ICD-10-CM | POA: Diagnosis not present

## 2023-12-22 DIAGNOSIS — D62 Acute posthemorrhagic anemia: Secondary | ICD-10-CM | POA: Diagnosis present

## 2023-12-22 DIAGNOSIS — Z85828 Personal history of other malignant neoplasm of skin: Secondary | ICD-10-CM

## 2023-12-22 DIAGNOSIS — F1090 Alcohol use, unspecified, uncomplicated: Secondary | ICD-10-CM | POA: Diagnosis not present

## 2023-12-22 DIAGNOSIS — G8929 Other chronic pain: Secondary | ICD-10-CM | POA: Diagnosis present

## 2023-12-22 DIAGNOSIS — Z87891 Personal history of nicotine dependence: Secondary | ICD-10-CM

## 2023-12-22 DIAGNOSIS — K921 Melena: Secondary | ICD-10-CM | POA: Diagnosis not present

## 2023-12-22 DIAGNOSIS — Z79899 Other long term (current) drug therapy: Secondary | ICD-10-CM

## 2023-12-22 DIAGNOSIS — I10 Essential (primary) hypertension: Secondary | ICD-10-CM | POA: Diagnosis present

## 2023-12-22 DIAGNOSIS — Z83438 Family history of other disorder of lipoprotein metabolism and other lipidemia: Secondary | ICD-10-CM

## 2023-12-22 DIAGNOSIS — Z7982 Long term (current) use of aspirin: Secondary | ICD-10-CM

## 2023-12-22 DIAGNOSIS — D649 Anemia, unspecified: Secondary | ICD-10-CM

## 2023-12-22 DIAGNOSIS — K862 Cyst of pancreas: Secondary | ICD-10-CM | POA: Diagnosis present

## 2023-12-22 DIAGNOSIS — Z91048 Other nonmedicinal substance allergy status: Secondary | ICD-10-CM

## 2023-12-22 DIAGNOSIS — F419 Anxiety disorder, unspecified: Secondary | ICD-10-CM | POA: Diagnosis present

## 2023-12-22 DIAGNOSIS — K2961 Other gastritis with bleeding: Secondary | ICD-10-CM | POA: Diagnosis not present

## 2023-12-22 DIAGNOSIS — K254 Chronic or unspecified gastric ulcer with hemorrhage: Principal | ICD-10-CM | POA: Diagnosis present

## 2023-12-22 DIAGNOSIS — I959 Hypotension, unspecified: Secondary | ICD-10-CM | POA: Diagnosis present

## 2023-12-22 DIAGNOSIS — K279 Peptic ulcer, site unspecified, unspecified as acute or chronic, without hemorrhage or perforation: Secondary | ICD-10-CM

## 2023-12-22 DIAGNOSIS — Z803 Family history of malignant neoplasm of breast: Secondary | ICD-10-CM

## 2023-12-22 DIAGNOSIS — K922 Gastrointestinal hemorrhage, unspecified: Secondary | ICD-10-CM | POA: Diagnosis not present

## 2023-12-22 DIAGNOSIS — Z79890 Hormone replacement therapy (postmenopausal): Secondary | ICD-10-CM

## 2023-12-22 DIAGNOSIS — E039 Hypothyroidism, unspecified: Secondary | ICD-10-CM | POA: Diagnosis present

## 2023-12-22 DIAGNOSIS — Z90710 Acquired absence of both cervix and uterus: Secondary | ICD-10-CM

## 2023-12-22 DIAGNOSIS — M1612 Unilateral primary osteoarthritis, left hip: Secondary | ICD-10-CM | POA: Diagnosis present

## 2023-12-22 DIAGNOSIS — K5710 Diverticulosis of small intestine without perforation or abscess without bleeding: Secondary | ICD-10-CM | POA: Diagnosis present

## 2023-12-22 DIAGNOSIS — M4726 Other spondylosis with radiculopathy, lumbar region: Secondary | ICD-10-CM | POA: Diagnosis present

## 2023-12-22 HISTORY — PX: ESOPHAGOGASTRODUODENOSCOPY: SHX5428

## 2023-12-22 LAB — COMPREHENSIVE METABOLIC PANEL WITH GFR
ALT: 14 U/L (ref 0–44)
AST: 19 U/L (ref 15–41)
Albumin: 3.5 g/dL (ref 3.5–5.0)
Alkaline Phosphatase: 58 U/L (ref 38–126)
Anion gap: 10 (ref 5–15)
BUN: 70 mg/dL — ABNORMAL HIGH (ref 8–23)
CO2: 24 mmol/L (ref 22–32)
Calcium: 7.9 mg/dL — ABNORMAL LOW (ref 8.9–10.3)
Chloride: 101 mmol/L (ref 98–111)
Creatinine, Ser: 1.02 mg/dL — ABNORMAL HIGH (ref 0.44–1.00)
GFR, Estimated: 55 mL/min — ABNORMAL LOW (ref >60)
Glucose, Bld: 153 mg/dL — ABNORMAL HIGH (ref 70–99)
Potassium: 3.6 mmol/L (ref 3.5–5.1)
Sodium: 135 mmol/L (ref 135–145)
Total Bilirubin: 0.2 mg/dL (ref 0.0–1.2)
Total Protein: 5.3 g/dL — ABNORMAL LOW (ref 6.5–8.1)

## 2023-12-22 LAB — BLOOD CULTURE ID PANEL (REFLEXED) - BCID2

## 2023-12-22 LAB — URINALYSIS, ROUTINE W REFLEX MICROSCOPIC
Bacteria, UA: NONE SEEN
Bilirubin Urine: NEGATIVE
Glucose, UA: NEGATIVE mg/dL
Ketones, ur: NEGATIVE mg/dL
Leukocytes,Ua: NEGATIVE
Nitrite: NEGATIVE
Protein, ur: NEGATIVE mg/dL
Specific Gravity, Urine: 1.027 (ref 1.005–1.030)
pH: 6 (ref 5.0–8.0)

## 2023-12-22 LAB — PROTIME-INR
INR: 1.1 (ref 0.8–1.2)
Prothrombin Time: 14.8 s (ref 11.4–15.2)

## 2023-12-22 LAB — CBC
HCT: 15.7 % — ABNORMAL LOW (ref 36.0–46.0)
Hemoglobin: 5.2 g/dL — CL (ref 12.0–15.0)
MCH: 33.3 pg (ref 26.0–34.0)
MCHC: 33.1 g/dL (ref 30.0–36.0)
MCV: 100.6 fL — ABNORMAL HIGH (ref 80.0–100.0)
Platelets: 196 K/uL (ref 150–400)
RBC: 1.56 MIL/uL — ABNORMAL LOW (ref 3.87–5.11)
RDW: 12.5 % (ref 11.5–15.5)
WBC: 8.1 K/uL (ref 4.0–10.5)
nRBC: 0 % (ref 0.0–0.2)

## 2023-12-22 LAB — FOLATE: Folate: 6 ng/mL (ref >5.9)

## 2023-12-22 LAB — LIPASE, BLOOD: Lipase: 55 U/L — ABNORMAL HIGH (ref 11–51)

## 2023-12-22 LAB — PREPARE RBC (CROSSMATCH)

## 2023-12-22 LAB — ABO/RH: ABO/RH(D): O POS

## 2023-12-22 LAB — HEMOGLOBIN AND HEMATOCRIT, BLOOD
HCT: 34.4 % — ABNORMAL LOW (ref 36.0–46.0)
Hemoglobin: 12.3 g/dL (ref 12.0–15.0)

## 2023-12-22 LAB — VITAMIN B12: Vitamin B-12: 255 pg/mL (ref 180–914)

## 2023-12-22 LAB — TSH: TSH: 1.56 u[IU]/mL (ref 0.350–4.500)

## 2023-12-22 LAB — PHOSPHORUS: Phosphorus: 2.4 mg/dL — ABNORMAL LOW (ref 2.5–4.6)

## 2023-12-22 LAB — MAGNESIUM: Magnesium: 1.8 mg/dL (ref 1.7–2.4)

## 2023-12-22 SURGERY — EGD (ESOPHAGOGASTRODUODENOSCOPY)
Anesthesia: Monitor Anesthesia Care

## 2023-12-22 MED ORDER — PANTOPRAZOLE SODIUM 40 MG IV SOLR
40.0000 mg | Freq: Two times a day (BID) | INTRAVENOUS | Status: DC
  Administered 2023-12-22 – 2023-12-24 (×5): 40 mg via INTRAVENOUS
  Filled 2023-12-22 (×5): qty 10

## 2023-12-22 MED ORDER — STERILE WATER FOR IRRIGATION IR SOLN
Status: DC | PRN
  Administered 2023-12-22: 60 mL

## 2023-12-22 MED ORDER — MAGNESIUM OXIDE -MG SUPPLEMENT 400 (240 MG) MG PO TABS
400.0000 mg | ORAL_TABLET | Freq: Every day | ORAL | Status: DC
  Administered 2023-12-22 – 2023-12-24 (×3): 400 mg via ORAL
  Filled 2023-12-22 (×3): qty 1

## 2023-12-22 MED ORDER — ATORVASTATIN CALCIUM 20 MG PO TABS
20.0000 mg | ORAL_TABLET | Freq: Every day | ORAL | Status: DC
  Administered 2023-12-22 – 2023-12-23 (×2): 20 mg via ORAL
  Filled 2023-12-22 (×2): qty 1

## 2023-12-22 MED ORDER — ZOLPIDEM TARTRATE 5 MG PO TABS
5.0000 mg | ORAL_TABLET | Freq: Every evening | ORAL | Status: DC | PRN
  Administered 2023-12-22 – 2023-12-23 (×2): 5 mg via ORAL
  Filled 2023-12-22 (×2): qty 1

## 2023-12-22 MED ORDER — ONDANSETRON HCL 4 MG PO TABS: 4.0000 mg | ORAL_TABLET | Freq: Four times a day (QID) | ORAL | Status: DC | PRN

## 2023-12-22 MED ORDER — BENAZEPRIL HCL 20 MG PO TABS
20.0000 mg | ORAL_TABLET | Freq: Every day | ORAL | Status: DC
  Administered 2023-12-22 – 2023-12-24 (×3): 20 mg via ORAL
  Filled 2023-12-22 (×3): qty 1

## 2023-12-22 MED ORDER — ONDANSETRON HCL 4 MG/2ML IJ SOLN
4.0000 mg | Freq: Once | INTRAMUSCULAR | Status: AC
  Administered 2023-12-22: 4 mg via INTRAVENOUS
  Filled 2023-12-22: qty 2

## 2023-12-22 MED ORDER — SODIUM CHLORIDE 0.9 % IV BOLUS
1000.0000 mL | Freq: Once | INTRAVENOUS | Status: AC
  Administered 2023-12-22: 1000 mL via INTRAVENOUS

## 2023-12-22 MED ORDER — PANCRELIPASE (LIP-PROT-AMYL) 36000-114000 UNITS PO CPEP
72000.0000 [IU] | ORAL_CAPSULE | Freq: Three times a day (TID) | ORAL | Status: DC
  Administered 2023-12-22 – 2023-12-24 (×5): 72000 [IU] via ORAL
  Filled 2023-12-22 (×6): qty 2

## 2023-12-22 MED ORDER — LIDOCAINE 2% (20 MG/ML) 5 ML SYRINGE
INTRAMUSCULAR | Status: DC | PRN
  Administered 2023-12-22: 60 mg via INTRAVENOUS

## 2023-12-22 MED ORDER — ACETAMINOPHEN 325 MG PO TABS
650.0000 mg | ORAL_TABLET | Freq: Four times a day (QID) | ORAL | Status: DC | PRN
  Administered 2023-12-24: 650 mg via ORAL
  Filled 2023-12-22: qty 2

## 2023-12-22 MED ORDER — CALCIUM CARBONATE 1250 (500 CA) MG PO TABS
1.0000 | ORAL_TABLET | Freq: Every day | ORAL | Status: DC
  Administered 2023-12-22 – 2023-12-24 (×3): 1250 mg via ORAL
  Filled 2023-12-22 (×3): qty 1

## 2023-12-22 MED ORDER — LACTATED RINGERS IV SOLN: INTRAVENOUS | Status: DC

## 2023-12-22 MED ORDER — PANCRELIPASE (LIP-PROT-AMYL) 36000-114000 UNITS PO CPEP: 36000.0000 [IU] | ORAL_CAPSULE | ORAL | Status: DC | PRN

## 2023-12-22 MED ORDER — LACTATED RINGERS IV SOLN: INTRAVENOUS | Status: DC | PRN

## 2023-12-22 MED ORDER — ONDANSETRON HCL 4 MG/2ML IJ SOLN: 4.0000 mg | Freq: Four times a day (QID) | INTRAMUSCULAR | Status: DC | PRN

## 2023-12-22 MED ORDER — ACETAMINOPHEN 650 MG RE SUPP
650.0000 mg | Freq: Four times a day (QID) | RECTAL | Status: DC | PRN
Start: 2023-12-22 — End: 2023-12-24

## 2023-12-22 MED ORDER — IOHEXOL 350 MG/ML SOLN
100.0000 mL | Freq: Once | INTRAVENOUS | Status: AC | PRN
  Administered 2023-12-22: 100 mL via INTRAVENOUS

## 2023-12-22 MED ORDER — LEVOTHYROXINE SODIUM 75 MCG PO TABS
75.0000 ug | ORAL_TABLET | Freq: Every day | ORAL | Status: DC
  Administered 2023-12-23 – 2023-12-24 (×2): 75 ug via ORAL
  Filled 2023-12-22 (×2): qty 1

## 2023-12-22 MED ORDER — PROPOFOL 500 MG/50ML IV EMUL
INTRAVENOUS | Status: DC | PRN
  Administered 2023-12-22 (×4): 50 mg via INTRAVENOUS

## 2023-12-22 MED ORDER — SODIUM CHLORIDE 0.9 % IV SOLN: INTRAVENOUS | Status: DC

## 2023-12-22 MED ORDER — PANTOPRAZOLE SODIUM 40 MG IV SOLR
80.0000 mg | Freq: Once | INTRAVENOUS | Status: AC
  Administered 2023-12-22: 80 mg via INTRAVENOUS
  Filled 2023-12-22: qty 20

## 2023-12-22 MED ORDER — SODIUM CHLORIDE 0.9% IV SOLUTION: Freq: Once | INTRAVENOUS | Status: AC

## 2023-12-22 NOTE — Brief Op Note (Signed)
 Patient underwent EGD  under propofol  sedation.  Tolerated the procedure adequately.   FINDINGS:   - Normal esophagus. - Non- bleeding gastric ulcers with a clean ulcer base ( Forrest Class III) . Biopsied. - Non- bleeding gastric ulcer with a nonbleeding visible vessel ( Forrest Class IIa) . Clip ( MR conditional) was placed. Clip manufacturer: [employer]. - Normal duodenal bulb. - Non- bleeding duodenal diverticulum.  RECOMMENDATIONS   Advance diet as tolerated  PPI BID for 4 weeks than daily  Surveillance upper endoscopy in 8- 12 weeks to assess healing of ulcers  Avoid NSAIDs and ETOH cessation Follow up path    Linda Knight Linda Phuong Moffatt, MD Gastroenterology and Hepatology [facility]astro[facility]

## 2023-12-22 NOTE — Transfer of Care (Signed)
 Immediate Anesthesia Transfer of Care Note  Patient: Linda Knight  Procedure(s) Performed: EGD (ESOPHAGOGASTRODUODENOSCOPY)  Patient Location: PACU  Anesthesia Type:General  Level of Consciousness: awake, alert , oriented, and patient cooperative  Airway & Oxygen Therapy: Patient Spontanous Breathing  Post-op Assessment: Report given to RN, Post -op Vital signs reviewed and stable, and Patient moving all extremities X 4  Post vital signs: Reviewed and stable  Last Vitals:  Vitals Value Taken Time  BP 133/58 12/22/23 13:20  Temp    Pulse 82 12/22/23 13:22  Resp 16 12/22/23 13:22  SpO2 100 % 12/22/23 13:22  Vitals shown include unfiled device data.  Last Pain:  Vitals:   12/22/23 1304  TempSrc:   PainSc: 0-No pain      Patients Stated Pain Goal: 6 (12/22/23 1119)  Complications: No notable events documented.

## 2023-12-22 NOTE — H&P (Addendum)
 History and Physical    Patient: Linda Knight FMW:[id number] DOB: 1940-06-13 DOA: 12/22/2023 DOS: the patient was seen and examined on 12/22/2023 PCP: Kennyth Worth [name], MD  Patient coming from: Home  Chief Complaint:  Chief Complaint  Patient presents with   Loss of Consciousness   HPI: Linda Knight is a 83 y.o. female with medical history significant of hyperlipidemia, pancreatic cyst, CAD, hypothyroidism, chronic back pain who presents to the emergency department from home via EMS due to loss of consciousness. Patient states that she received 2 injections (60 mg of Toradol and 80 mg of Depo-Medrol  IM) from orthopedic surgeon's office due to low back pain on Monday ((11/10).  On Tuesday (11/11) patient states that she felt sick by having several episodes of nausea and vomiting as well as diarrhea with difficulty in being able to tolerate any oral intake due to vomiting.  Yesterday (11/12), patient states that she must have passed out while in the restroom, since husband found her with decreased responsiveness next to the toilet and she had a lot of black stool. She complained of generalized weakness, but denies chest pain, shortness of breath, fever, chills  ED course In the emergency department, she was tachycardic, BP was 150/48, other vital signs were within normal range.  Workup in the ED showed H/H 5.2/15.7 (this was 10.7/30.7 on 11/22/2023), MCV 100.6.  BMP was normal except for blood glucose of 153, BUN 70, creatinine 1.02, calcium  7.9, TSH 1.56.  Blood culture pending. CT angiography of abdomen and pelvis with contrast showed no active GI bleeding.  Mild bladder wall thickening, equivocal.  Correlate for cystitis. Chest x-ray showed no acute cardiopulmonary process Protonix was given, Zofran  was given, IV NS 1 L was provided and 3 units of PRBCs was transfused.    Review of Systems: As mentioned in the history of present illness. All other systems reviewed and are  negative. Past Medical History:  Diagnosis Date   Anemia    Arthritis    CAD (coronary artery disease)    High cholesterol    Hypertension    Pancreatic cyst    SCCA (squamous cell carcinoma) of skin 2019   Right Arm [facility])   Sleep apnea    Squamous cell carcinoma of skin 2019   Left Leg [facility])   Thyroid  disease    Past Surgical History:  Procedure Laterality Date   ABDOMINAL HYSTERECTOMY     APPENDECTOMY     BIOPSY  09/27/2019   Procedure: BIOPSY;  Surgeon: Teressa Toribio SQUIBB, MD;  Location: [facility] END[facility]COPY;  Service: Endoscopy;;   BREAST EXCISIONAL BIOPSY Right 1993   BUNIONECTOMY     carcinoma removed Right 09/2020   CYSTOCELE REPAIR     bladder repair   ESOPHAGOGASTRODUODENOSCOPY (EGD) WITH PROPOFOL  N/A 09/27/2019   Procedure: ESOPHAGOGASTRODUODENOSCOPY (EGD) WITH PROPOFOL ;  Surgeon: Teressa Toribio SQUIBB, MD;  Location: [facility] ENDOSCOPY;  Service: Endoscopy;  Laterality: N/A;   EUS N/A 09/27/2019   Procedure: UPPER ENDOSCOPIC ULTRASOUND (EUS) RADIAL;  Surgeon: Teressa Toribio SQUIBB, MD;  Location: [facility];  Service: Endoscopy;  Laterality: N/A;   KNEE ARTHROSCOPY  06/2021   TYMPANOPLASTY Left 06/2020   VAGINAL PROLAPSE REPAIR  12/07/2019   Social History:  reports that she quit smoking about 43 years ago. Her smoking use included cigarettes. She has never used smokeless tobacco. She reports that she does not currently use alcohol. She reports that she does not use drugs.  Allergies  Allergen Reactions  Tape Rash    Family History  Problem Relation Age of Onset   Hyperlipidemia Mother    Hypertension Mother    Stroke Mother    Lung disease Father    Breast cancer Cousin 71 - 41   Heart disease Brother    Colon cancer Neg Hx     Prior to Admission medications   Medication Sig Start Date End Date Taking? Authorizing Provider  aspirin EC 81 MG tablet Take 81 mg by mouth daily. Swallow whole.    [provider]  atorvastatin  (LIPITOR) 20 MG tablet Take 1  tablet (20 mg total) by mouth daily. 02/09/22   Kennyth Worth [name], MD  b complex vitamins capsule Take 1 capsule by mouth daily.    [provider]  benazepril  (LOTENSIN ) 20 MG tablet Take 1 tablet (20 mg total) by mouth daily. 11/22/23   Kennyth Worth [name], MD  Cholecalciferol  (VITAMIN D3) 50 MCG (2000 UT) capsule Take 2,000 Units by mouth daily.    [provider]  conjugated estrogens  (PREMARIN ) vaginal cream Place 1 Applicatorful vaginally once a week. 03/21/23   Kennyth Worth [name], MD  hydrochlorothiazide  (HYDRODIURIL ) 25 MG tablet TAKE 1/2 TABLET BY MOUTH EVERY DAY 05/09/23   Kennyth Worth [name], MD  KLOR-CON  M20 20 MEQ tablet TAKE 1 TABLET BY MOUTH TWICE A DAY 06/15/23   Mallipeddi, Vishnu P, MD  levothyroxine  (SYNTHROID ) 75 MCG tablet Take 1 tablet (75 mcg total) by mouth daily. 11/25/23   Kennyth Worth [name], MD  lipase/protease/amylase (CREON ) 36000 UNITS CPEP capsule Take 2 capsules (72,000 Units total) by mouth 3 (three) times daily with meals. May also take 1 capsule (36,000 Units total) as needed (with snacks - up to 4 snacks daily). 09/26/23   Kennyth Worth [name], MD  magnesium oxide (MAG-OX) 400 (240 Mg) MG tablet Take 400 mg by mouth daily.    [provider]  zolpidem  (AMBIEN  CR) 12.5 MG CR tablet TAKE 1 TABLET(12.5 MG) BY MOUTH AT BEDTIME AS NEEDED FOR SLEEP 12/19/23   Kennyth Worth [name], MD    Physical Exam: Vitals:   12/22/23 0545 12/22/23 0600 12/22/23 0615 12/22/23 0627  BP: (!) 113/55 (!) 114/58 129/70 137/65  Pulse: 74 72 81 85  Resp: 15 16 13 17   Temp:    98.2 F (36.8 C)  TempSrc:    Oral  SpO2: 96% 96% 99% 98%  Weight:      Height:        General: Elderly female. Awake and alert and oriented x3. Not in any acute distress.  HEENT: NCAT.  PERRLA. EOMI. Sclerae anicteric.  Dry mucosal membranes. Neck: Neck supple without lymphadenopathy. No carotid bruits. No masses palpated.  Cardiovascular: Regular rate with normal S1-S2 sounds. No murmurs, rubs or gallops  auscultated. No JVD.  Respiratory: Clear breath sounds.  No accessory muscle use. Abdomen: Soft, nontender, nondistended. Active bowel sounds. No masses or hepatosplenomegaly  Skin: No rashes, lesions, or ulcerations.  Dry, warm to touch. Musculoskeletal:  2+ dorsalis pedis and radial pulses. Good ROM.  No contractures  Psychiatric: Intact judgment and insight.  Mood appropriate to current condition. Neurologic: No focal neurological deficits. Strength is 5/5 x 4.  CN II - XII grossly intact.   Data Reviewed: Sinus tachycardia with irregular rate at a rate of 100 bpm  Assessment and Plan: GI bleed H/H 5.2/15.7 (this was 10.7/30.7 on 11/22/2023) Type and crossmatch was done 3 units of PRBC will be transfused IV Protonix 80 mg x 1  was given, continue IV Protonix 40 mg twice daily Gastroenterologist  will be consulted in the morning  Dehydration IV hydration was provided  Nausea and vomiting Continue Zofran  as needed  Hypocalcemia Corrected calcium  level based on albumin was 8.3 Continue Os-Cal when patient resumes oral intake  Chronic low back pain Patient has lumbar spondylosis with left leg radiculopathy, left hip mild osteoarthritis. She received 60 mg of Toradol and 80 mg of Depo-Medrol  IM at orthopedics office on Monday 11/12  Elevated MCV MCV 100.6, folate and vitamin B12 levels will be checked  Mixed hyperlipidemia/CAD Continue statin  Acquired hypothyroidism Continue Synthroid   Pancreatic cyst Continue Creon     Advance Care Planning: Full code  Consults: Gastroenterology  Family Communication: None at bedside  Severity of Illness: The appropriate patient status for this patient is OBSERVATION. Observation status is judged to be reasonable and necessary in order to provide the required intensity of service to ensure the patient's safety. The patient's presenting symptoms, physical exam findings, and initial radiographic and laboratory data in the context of  their medical condition is felt to place them at decreased risk for further clinical deterioration. Furthermore, it is anticipated that the patient will be medically stable for discharge from the hospital within 2 midnights of admission.   Author: Thomas Mabry, DO 12/22/2023 6:46 AM  For on call review [email or web address].

## 2023-12-22 NOTE — Progress Notes (Signed)
  Transition of Care [facility]) Screening Note   Patient Details  Name: Linda Knight Date of Birth: 1940-05-06   Transition of Care [facility]) CM/SW Contact:    Hoy [name], LCSW Phone Number: 12/22/2023, 10:06 AM    Transition of Care Department [facility]) has reviewed patient and no TOC needs have been identified at this time. We will continue to monitor patient advancement through interdisciplinary progression rounds. If new patient transition needs arise, please place a TOC consult.    12/22/23 1006  TOC Brief Assessment  Insurance and Status Reviewed  Patient has primary care physician Yes  Home environment has been reviewed Home w/ spouse  Prior level of function: Independent  Prior/Current Home Services No current home services  Social Drivers of Health Review SDOH reviewed no interventions necessary  Readmission risk has been reviewed Yes  Transition of care needs no transition of care needs at this time

## 2023-12-22 NOTE — Progress Notes (Signed)
 Blood culture Gram + cocci page Dr Vicci with results

## 2023-12-22 NOTE — ED Provider Notes (Signed)
 AP-EMERGENCY DEPT [facility] Emergency Department Provider Note MRN:  [id number]  Arrival date & time: 12/22/23     Chief Complaint   Loss of Consciousness   History of Present Illness   Linda Knight is a 83 y.o. year-old female with a history of CAD presenting to the ED with chief complaint of loss of consciousness.  Patient found with decreased responsiveness next to the toilet, having a lot of black stool today.  Hypotensive with EMS  Review of Systems  A thorough review of systems was obtained and all systems are negative except as noted in the HPI and PMH.   Patient's Health History    Past Medical History:  Diagnosis Date   Anemia    Arthritis    CAD (coronary artery disease)    High cholesterol    Hypertension    Pancreatic cyst    SCCA (squamous cell carcinoma) of skin 2019   Right Arm [facility])   Sleep apnea    Squamous cell carcinoma of skin 2019   Left Leg [facility])   Thyroid  disease     Past Surgical History:  Procedure Laterality Date   ABDOMINAL HYSTERECTOMY     APPENDECTOMY     BIOPSY  09/27/2019   Procedure: BIOPSY;  Surgeon: Teressa Toribio SQUIBB, MD;  Location: [facility] ENDOSCOPY;  Service: Endoscopy;;   BREAST EXCISIONAL BIOPSY Right 1993   BUNIONECTOMY     carcinoma removed Right 09/2020   CYSTOCELE REPAIR     bladder repair   ESOPHAGOGASTRODUODENOSCOPY (EGD) WITH PROPOFOL  N/A 09/27/2019   Procedure: ESOPHAGOGASTRODUODENOSCOPY (EGD) WITH PROPOFOL ;  Surgeon: Teressa Toribio SQUIBB, MD;  Location: [facility] ENDOSCOPY;  Service: Endoscopy;  Laterality: N/A;   EUS N/A 09/27/2019   Procedure: UPPER ENDOSCOPIC ULTRASOUND (EUS) RADIAL;  Surgeon: Teressa Toribio SQUIBB, MD;  Location: [facility];  Service: Endoscopy;  Laterality: N/A;   KNEE ARTHROSCOPY  06/2021   TYMPANOPLASTY Left 06/2020   VAGINAL PROLAPSE REPAIR  12/07/2019    Family History  Problem Relation Age of Onset   Hyperlipidemia Mother    Hypertension Mother    Stroke Mother    Lung disease  Father    Breast cancer Cousin 82 - 67   Heart disease Brother    Colon cancer Neg Hx     Social History   Socioeconomic History   Marital status: Married    Spouse name: Not on file   Number of children: 3   Years of education: Not on file   Highest education level: Not on file  Occupational History   Occupation: Retired   Tobacco Use   Smoking status: Former    Current packs/day: 0.00    Types: Cigarettes    Quit date: 09/18/1980    Years since quitting: 43.2   Smokeless tobacco: Never  Vaping Use   Vaping status: Never Used  Substance and Sexual Activity   Alcohol use: Not Currently   Drug use: Never   Sexual activity: Not Currently    Comment: 1st intercourse 83 yo-Fewer than 5 partners  Other Topics Concern   Not on file  Social History Narrative   Moved from [place] July 2020   Social Drivers of Health   Financial Resource Strain: Low Risk  (01/12/2023)   Overall Financial Resource Strain (CARDIA)    Difficulty of Paying Living Expenses: Not hard at all  Food Insecurity: No Food Insecurity (01/12/2023)   Hunger Vital Sign    Worried About Running Out of Food  in the Last Year: Never true    Ran Out of Food in the Last Year: Never true  Transportation Needs: No Transportation Needs (01/12/2023)   PRAPARE - Administrator, Civil Service (Medical): No    Lack of Transportation (Non-Medical): No  Physical Activity: Insufficiently Active (01/12/2023)   Exercise Vital Sign    Days of Exercise per Week: 3 days    Minutes of Exercise per Session: 20 min  Stress: No Stress Concern Present (01/12/2023)   [employer] of Occupational Health - Occupational Stress Questionnaire    Feeling of Stress : Not at all  Social Connections: Moderately Isolated (01/12/2023)   Social Connection and Isolation Panel    Frequency of Communication with Friends and Family: More than three times a week    Frequency of Social Gatherings with Friends and Family: More than  three times a week    Attends Religious Services: Never    Database Administrator or Organizations: No    Attends Banker Meetings: Never    Marital Status: Married  Catering Manager Violence: Not At Risk (01/12/2023)   Humiliation, Afraid, Rape, and Kick questionnaire    Fear of Current or Ex-Partner: No    Emotionally Abused: No    Physically Abused: No    Sexually Abused: No     Physical Exam   Vitals:   12/22/23 0449 12/22/23 0500  BP: (!) 124/57 110/64  Pulse: 89 89  Resp: (!) 21 15  Temp: 97.8 F (36.6 C)   SpO2: 98% 96%    CONSTITUTIONAL: Ill-appearing, pale NEURO/PSYCH:  Alert and oriented x 3, no focal deficits EYES:  eyes equal and reactive ENT/NECK:  no LAD, no JVD CARDIO: Regular rate, well-perfused, normal S1 and S2 PULM:  CTAB no wheezing or rhonchi GI/GU:  non-distended, non-tender MSK/SPINE:  No gross deformities, no edema SKIN:  no rash, atraumatic   *Additional and/or pertinent findings included in MDM below  Diagnostic and Interventional Summary    EKG Interpretation Date/Time:  Thursday December 22 2023 02:23:41 EST Ventricular Rate:  100 PR Interval:  197 QRS Duration:  92 QT Interval:  365 QTC Calculation: 471 R Axis:   70  Text Interpretation: Sinus tachycardia with irregular rate Borderline T abnormalities, anterior leads Confirmed by Theadore Sharper [phone]) on 12/22/2023 2:50:49 AM       Labs Reviewed  CBC - Abnormal; Notable for the following components:      Result Value   RBC 1.56 (*)    Hemoglobin 5.2 (*)    HCT 15.7 (*)    MCV 100.6 (*)    All other components within normal limits  COMPREHENSIVE METABOLIC PANEL WITH GFR - Abnormal; Notable for the following components:   Glucose, Bld 153 (*)    BUN 70 (*)    Creatinine, Ser 1.02 (*)    Calcium  7.9 (*)    Total Protein 5.3 (*)    GFR, Estimated 55 (*)    All other components within normal limits  LIPASE, BLOOD - Abnormal; Notable for the following components:    Lipase 55 (*)    All other components within normal limits  CULTURE, BLOOD (ROUTINE X 2)  CULTURE, BLOOD (ROUTINE X 2)  TSH  PROTIME-INR  URINALYSIS, ROUTINE W REFLEX MICROSCOPIC  TYPE AND SCREEN  PREPARE RBC (CROSSMATCH)  ABO/RH  PREPARE RBC (CROSSMATCH)    CT ANGIO GI BLEED  Final Result    DG Chest Port 1 View  Final Result  Medications  sodium chloride  0.9 % bolus 1,000 mL (0 mLs Intravenous Stopped 12/22/23 0435)  pantoprazole (PROTONIX) injection 80 mg (80 mg Intravenous Given 12/22/23 0248)  0.9 %  sodium chloride  infusion (Manually program via Guardrails IV Fluids) ( Intravenous New Bag/Given 12/22/23 0415)  iohexol (OMNIPAQUE) 350 MG/ML injection 100 mL (100 mLs Intravenous Contrast Given 12/22/23 0347)  ondansetron  (ZOFRAN ) injection 4 mg (4 mg Intravenous Given 12/22/23 0415)     Procedures  /  Critical Care .Critical Care  Performed by: Theadore Ozell [name], MD Authorized by: Theadore Ozell [name], MD   Critical care provider statement:    Critical care time (minutes):  80   Critical care was necessary to treat or prevent imminent or life-threatening deterioration of the following conditions: Symptomatic anemia requiring blood transfusion.   Critical care was time spent personally by me on the following activities:  Development of treatment plan with patient or surrogate, discussions with consultants, evaluation of patient's response to treatment, examination of patient, ordering and review of laboratory studies, ordering and review of radiographic studies, ordering and performing treatments and interventions, pulse oximetry, re-evaluation of patient's condition and review of old charts   ED Course and Medical Decision Making  Initial Impression and Ddx Concern for GI bleed with hypotension, maintaining blood pressure at this time, will provide blood transfusion if needed, awaiting labs, having some lower abdominal pain as well, obtaining CT a GI bleed study.  Past  medical/surgical history that increases complexity of ED encounter: None  Interpretation of Diagnostics I personally reviewed the Laboratory Testing and my interpretation is as follows: Profound anemia with hemoglobin of 5.2, most recent baseline 10.7 last month    Patient Reassessment and Ultimate Disposition/Management     CT without obvious active bleeding or acute pathology.  Patient responding well to blood transfusion, accepted for hospitalist admission.  Patient management required discussion with the following services or consulting groups:  Hospitalist Service  Complexity of Problems Addressed Acute illness or injury that poses threat of life of bodily function  Additional Data Reviewed and Analyzed Further history obtained from: EMS on arrival  Additional Factors Impacting ED Encounter Risk Consideration of hospitalization  Ozell [name]. Theadore, MD [facility] Emergency Medicine [facility] [email or web address]  Final Clinical Impressions(s) / ED Diagnoses     ICD-10-CM   1. Gastrointestinal hemorrhage, unspecified gastrointestinal hemorrhage type  K92.2     2. Symptomatic anemia  D64.9       ED Discharge Orders     None        Discharge Instructions Discussed with and Provided to Patient:   Discharge Instructions   None      Theadore Ozell [name], MD 12/22/23 [phone]

## 2023-12-22 NOTE — ED Triage Notes (Signed)
 Pt c/o syncopal episode. Pt was found on the floor by husband who then put her on the toilet. Pt was unresponsive while on the commode. Dark tarry stool was seen when pt was removed from toilet. Pt able to answer EDP questions.

## 2023-12-22 NOTE — Progress Notes (Signed)
 ASSUMPTION OF CARE NOTE   12/22/2023 11:55 AM  Linda Knight was seen and examined.  The H&P by the admitting provider, orders, imaging was reviewed.  Please see new orders.  Will continue to follow.   Vitals:   12/22/23 1048 12/22/23 1119  BP:  (!) 155/76  Pulse:  85  Resp:  17  Temp: 98.6 F (37 C) 98.7 F (37.1 C)  SpO2:  100%    Results for orders placed or performed during the hospital encounter of 12/22/23  CBC   Collection Time: 12/22/23  2:40 AM  Result Value Ref Range   WBC 8.1 4.0 - 10.5 K/uL   RBC 1.56 (L) 3.87 - 5.11 MIL/uL   Hemoglobin 5.2 (LL) 12.0 - 15.0 g/dL   HCT 84.2 (L) 63.9 - 53.9 %   MCV 100.6 (H) 80.0 - 100.0 fL   MCH 33.3 26.0 - 34.0 pg   MCHC 33.1 30.0 - 36.0 g/dL   RDW 87.4 88.4 - 84.4 %   Platelets 196 150 - 400 K/uL   nRBC 0.0 0.0 - 0.2 %  Comprehensive metabolic panel with GFR   Collection Time: 12/22/23  2:40 AM  Result Value Ref Range   Sodium 135 135 - 145 mmol/L   Potassium 3.6 3.5 - 5.1 mmol/L   Chloride 101 98 - 111 mmol/L   CO2 24 22 - 32 mmol/L   Glucose, Bld 153 (H) 70 - 99 mg/dL   BUN 70 (H) 8 - 23 mg/dL   Creatinine, Ser 8.97 (H) 0.44 - 1.00 mg/dL   Calcium  7.9 (L) 8.9 - 10.3 mg/dL   Total Protein 5.3 (L) 6.5 - 8.1 g/dL   Albumin 3.5 3.5 - 5.0 g/dL   AST 19 15 - 41 U/L   ALT 14 0 - 44 U/L   Alkaline Phosphatase 58 38 - 126 U/L   Total Bilirubin 0.2 0.0 - 1.2 mg/dL   GFR, Estimated 55 (L) >60 mL/min   Anion gap 10 5 - 15  Lipase, blood   Collection Time: 12/22/23  2:40 AM  Result Value Ref Range   Lipase 55 (H) 11 - 51 U/L  TSH   Collection Time: 12/22/23  2:40 AM  Result Value Ref Range   TSH 1.560 0.350 - 4.500 uIU/mL  Magnesium   Collection Time: 12/22/23  2:40 AM  Result Value Ref Range   Magnesium 1.8 1.7 - 2.4 mg/dL  Phosphorus   Collection Time: 12/22/23  2:40 AM  Result Value Ref Range   Phosphorus 2.4 (L) 2.5 - 4.6 mg/dL  Type and screen [facility]   Collection Time: 12/22/23  2:40 AM   Result Value Ref Range   ABO/RH(D) O POS    Antibody Screen NEG    Sample Expiration 12/25/2023,2359    Unit Number [id number]    Blood Component Type RED CELLS,LR    Unit division 00    Status of Unit ISSUED    Unit tag comment EMERGENCY RELEASE    Transfusion Status OK TO TRANSFUSE    Crossmatch Result COMPATIBLE    Unit Number [id number]    Blood Component Type RED CELLS,LR    Unit division 00    Status of Unit ISSUED    Transfusion Status OK TO TRANSFUSE    Crossmatch Result Compatible    Unit Number [id number]    Blood Component Type RED CELLS,LR    Unit division 00    Status of Unit ALLOCATED    Transfusion  Status OK TO TRANSFUSE    Crossmatch Result Compatible    Unit Number [id number]    Blood Component Type RED CELLS,LR    Unit division 00    Status of Unit ISSUED    Transfusion Status OK TO TRANSFUSE    Crossmatch Result Compatible   Prepare RBC (crossmatch)   Collection Time: 12/22/23  2:40 AM  Result Value Ref Range   Order Confirmation      ORDER PROCESSED BY BLOOD BANK Performed at [facility], [address] [place], [place] [address]   Prepare RBC (crossmatch)   Collection Time: 12/22/23  2:40 AM  Result Value Ref Range   Order Confirmation      ORDER PROCESSED BY BLOOD BANK Performed at [facility], [address], [place], [place] [address]   BPAM [facility]   Collection Time: 12/22/23  2:40 AM  Result Value Ref Range   ISSUE DATE / TIME [id number]    Blood Product Unit Number [id number]    Unit Type and Rh 9500    Blood Product Expiration Date 202512122359    ISSUE DATE / TIME [id number]    Blood Product Unit Number [id number]    PRODUCT CODE [id number]    Unit Type and Rh 5100    Blood Product Expiration Date 202512102359    Blood Product Unit Number [id number]    PRODUCT CODE [id number]    Unit Type and Rh 5100    Blood Product Expiration Date 202512102359    ISSUE DATE / TIME [id number]    Blood Product Unit Number  [id number]    Unit Type and Rh 5100    Blood Product Expiration Date 202512102359   Culture, blood (Routine X 2) w Reflex to ID Panel   Collection Time: 12/22/23  3:19 AM   Specimen: BLOOD  Result Value Ref Range   Specimen Description BLOOD BLOOD RIGHT HAND    Special Requests      BOTTLES DRAWN AEROBIC AND ANAEROBIC Blood Culture results may not be optimal due to an inadequate volume of blood received in culture bottles   Culture      NO GROWTH < 12 HOURS Performed at [facility], [address], [place], [place] [address]    Report Status PENDING   Culture, blood (Routine X 2) w Reflex to ID Panel   Collection Time: 12/22/23  3:19 AM   Specimen: BLOOD  Result Value Ref Range   Specimen Description BLOOD BLOOD LEFT HAND    Special Requests      BOTTLES DRAWN AEROBIC AND ANAEROBIC Blood Culture results may not be optimal due to an inadequate volume of blood received in culture bottles   Culture      NO GROWTH < 12 HOURS Performed at [facility], [address], [place], [place] [address]    Report Status PENDING   Protime-INR   Collection Time: 12/22/23  3:19 AM  Result Value Ref Range   Prothrombin Time 14.8 11.4 - 15.2 seconds   INR 1.1 0.8 - 1.2  ABO/Rh   Collection Time: 12/22/23  3:23 AM  Result Value Ref Range   ABO/RH(D)      O POS Performed at [facility], [address], [place], [place] [address]   Urinalysis, Routine w reflex microscopic -Urine, Clean Catch   Collection Time: 12/22/23  7:12 AM  Result Value Ref Range   Color, Urine STRAW (A) YELLOW   APPearance CLEAR CLEAR   Specific Gravity, Urine 1.027 1.005 - 1.030  pH 6.0 5.0 - 8.0   Glucose, UA NEGATIVE NEGATIVE mg/dL   Hgb urine dipstick MODERATE (A) NEGATIVE   Bilirubin Urine NEGATIVE NEGATIVE   Ketones, ur NEGATIVE NEGATIVE mg/dL   Protein, ur NEGATIVE NEGATIVE mg/dL   Nitrite NEGATIVE NEGATIVE   Leukocytes,Ua NEGATIVE NEGATIVE   RBC / HPF 0-5 0 - 5 RBC/hpf   WBC, UA 0-5 0 - 5 WBC/hpf    Bacteria, UA NONE SEEN NONE SEEN   Squamous Epithelial / HPF 0-5 0 - 5 /HPF   Mucus PRESENT   Folate   Collection Time: 12/22/23  8:03 AM  Result Value Ref Range   Folate 6.0 >5.9 ng/mL  Vitamin B12   Collection Time: 12/22/23  8:03 AM  Result Value Ref Range   Vitamin B-12 255 180 - 914 pg/mL  Hemoglobin and hematocrit, blood   Collection Time: 12/22/23  9:05 AM  Result Value Ref Range   Hemoglobin 12.3 12.0 - 15.0 g/dL   HCT 65.5 (L) 63.9 - 53.9 %   KYM Louder, MD [facility]   12/22/2023  2:20 AM How to contact the [facility] Attending or Consulting provider 7A - 7P or covering provider during after hours 7P -7A, for this patient?  Check the care team in [facility] and look for a) attending/consulting [facility] provider listed and b) the [facility] team listed Log into [email or web address] and use [place]'s universal password to access. If you do not have the password, please contact the hospital operator. Locate the [facility] provider you are looking for under [facility] and page to a number that you can be directly reached. If you still have difficulty reaching the provider, please page the [facility] (Director on Call) for the Hospitalists listed on amion for assistance.

## 2023-12-22 NOTE — Progress Notes (Signed)
Patient transferred to the floor via stretcher

## 2023-12-22 NOTE — Anesthesia Preprocedure Evaluation (Signed)
 Anesthesia Evaluation  Patient identified by MRN, date of birth, ID band Patient awake    Reviewed: Allergy & Precautions, H&P , NPO status , Patient's Chart, lab work & pertinent test results, reviewed documented beta blocker date and time   Airway Mallampati: II  TM Distance: >3 FB Neck ROM: full    Dental no notable dental hx.    Pulmonary neg pulmonary ROS, sleep apnea , former smoker   Pulmonary exam normal breath sounds clear to auscultation       Cardiovascular Exercise Tolerance: Good hypertension, + CAD  negative cardio ROS  Rhythm:regular Rate:Normal     Neuro/Psych   Anxiety      Neuromuscular disease negative neurological ROS  negative psych ROS   GI/Hepatic negative GI ROS, Neg liver ROS,,,  Endo/Other  negative endocrine ROSHypothyroidism    Renal/GU negative Renal ROS  negative genitourinary   Musculoskeletal   Abdominal   Peds  Hematology negative hematology ROS (+) Blood dyscrasia, anemia   Anesthesia Other Findings   Reproductive/Obstetrics negative OB ROS                              Anesthesia Physical Anesthesia Plan  ASA: 4 and emergent  Anesthesia Plan: MAC   Post-op Pain Management:    Induction:   PONV Risk Score and Plan: Propofol  infusion  Airway Management Planned:   Additional Equipment:   Intra-op Plan:   Post-operative Plan:   Informed Consent: I have reviewed the patients History and Physical, chart, labs and discussed the procedure including the risks, benefits and alternatives for the proposed anesthesia with the patient or authorized representative who has indicated his/her understanding and acceptance.     Dental Advisory Given  Plan Discussed with: CRNA  Anesthesia Plan Comments:         Anesthesia Quick Evaluation

## 2023-12-22 NOTE — Interval H&P Note (Signed)
 History and Physical Interval Note:  12/22/2023 12:31 PM  Linda Knight  has presented today for surgery, with the diagnosis of acute blood loss anemia, melena.  The various methods of treatment have been discussed with the patient and family. After consideration of risks, benefits and other options for treatment, the patient has consented to  Procedure(s): EGD (ESOPHAGOGASTRODUODENOSCOPY) (N/A) as a surgical intervention.  The patient's history has been reviewed, patient examined, no change in status, stable for surgery.  I have reviewed the patient's chart and labs.  Questions were answered to the patient's satisfaction.    I thoroughly discussed with the patient the procedure, including the risks involved. Patient understands what the procedure involves including the benefits and any risks. Patient understands alternatives to the proposed procedure. Risks including (but not limited to) bleeding, tearing of the lining (perforation), rupture of adjacent organs, problems with heart and lung function, infection, and medication reactions. A small percentage of complications may require surgery, hospitalization, repeat endoscopic procedure, and/or transfusion.  Patient understood and agreed.    Deatrice FALCON Maysa Lynn

## 2023-12-22 NOTE — Significant Event (Incomplete)
       CROSS COVER NOTE  NAME: Linda Knight MRN: [id number] DOB : January 23, 1941 ATTENDING PHYSICIAN: Vicci Afton CROME, MD    Date of Service   12/22/2023   HPI/Events of Note   [facility] Cross Cover at [facility] received from nurse: pt admitted for GI bleed. 2 units given. just got a call about blood cultures 2 out of 6 bottles with Staph epi and mecA/C. melissa   HPI labs chart reviewed  Interventions   Assessment/Plan: Blood pressure 129/67, pulse 76, temperature 98.2 F (36.8 C), temperature source Oral, resp. rate 20, height 5' 4 (1.626 m), weight 63.9 kg, SpO2 95%.  Physiology of sepsis not present. However has had X X X

## 2023-12-22 NOTE — Significant Event (Signed)
       CROSS COVER NOTE  NAME: Linda Knight MRN: [id number] DOB : 1940-04-20 ATTENDING PHYSICIAN: Vicci Afton CROME, MD    Date of Service   12/22/2023   HPI/Events of Note   [facility] Cross Cover at [facility] received from nurse: pt admitted for GI bleed. 2 units given. just got a call about blood cultures 2 out of 6 bottles with Staph epi and mecA/C. melissa   HPI labs chart reviewed, recently seen and treated with depo medrol  shot for her chronic lumbar radiculopathy and back pain. No history of spine surgery or instrumentation, no drug use history, former smoker.  Interventions   Assessment/Plan: Blood pressure 129/67, pulse 76, temperature 98.2 F (36.8 C), temperature source Oral, resp. rate 20, height 5' 4 (1.626 m), weight 63.9 kg, SpO2 95%.  Physiology of sepsis not present. However has had recent injections Blood cultures redrawn today at 1859 pm,  no prelim results from these available  Bacteremia vs contaminant  Vanc per pharmacy dosing Discretion of day team for ID consult Monitor close for sepsis and source of infection        Erminio CROME [facility] NP [facility] Cross Cover 7pm-7am - check amion for availability Pager [phone]

## 2023-12-22 NOTE — Op Note (Addendum)
 [facility] Patient Name: Linda Knight Procedure Date: 12/22/2023 11:58 AM MRN: [id number] Date of Birth: 1941-01-22 Attending MD: Deatrice Dine , MD, [id number] CSN: [id number] Age: 83 Admit Type: Inpatient Procedure:                Upper GI endoscopy Indications:              Acute post hemorrhagic anemia, Melena Providers:                Deatrice Dine, MD, Leandrew Edelman RN, RN, Daphne Mulch                            Technician, Technician Referring MD:              Medicines:                Monitored Anesthesia Care Complications:            No immediate complications. Estimated Blood Loss:     Estimated blood loss was minimal. Procedure:                Pre-Anesthesia Assessment:                           - Prior to the procedure, a History and Physical                            was performed, and patient medications and                            allergies were reviewed. The patient's tolerance of                            previous anesthesia was also reviewed. The risks                            and benefits of the procedure and the sedation                            options and risks were discussed with the patient.                            All questions were answered, and informed consent                            was obtained. Prior Anticoagulants: The patient has                            taken no anticoagulant or antiplatelet agents                            except for aspirin. ASA Grade Assessment: III - A                            patient with severe systemic disease. After  reviewing the risks and benefits, the patient was                            deemed in satisfactory condition to undergo the                            procedure.                           After obtaining informed consent, the endoscope was                            passed under direct vision. Throughout the                            procedure, the  patient's blood pressure, pulse, and                            oxygen saturations were monitored continuously. The                            [id number] ([id number]) Upper was introduced through                            the mouth, and advanced to the second part of                            duodenum. The upper GI endoscopy was accomplished                            without difficulty. The patient tolerated the                            procedure well. Scope In: 1:06:50 PM Scope Out: 1:15:41 PM Total Procedure Duration: 0 hours 8 minutes 51 seconds  Findings:      The examined esophagus was normal.      Many non-bleeding linear gastric ulcers with a clean ulcer base (Forrest       Class III) were found at the incisura and in the gastric antrum.       Biopsies were taken with a cold forceps for histology.      One non-bleeding gastric ulcer with a nonbleeding visible vessel       (Forrest Class IIa) was found on the posterior wall of the gastric       antrum. For hemostasis, one hemostatic clip was successfully placed (MR       conditional). Clip manufacturer: [employer]. There was no       bleeding at the end of the procedure.      The duodenal bulb was normal.      A non-bleeding diverticulum was found in the second portion of the       duodenum. Impression:               - Normal esophagus.                           -  Non-bleeding gastric ulcers with a clean ulcer                            base (Forrest Class III). Biopsied.                           - Non-bleeding gastric ulcer with a nonbleeding                            visible vessel (Forrest Class IIa). Clip (MR                            conditional) was placed. Clip manufacturer: [employer].                           - Normal duodenal bulb.                           - Non-bleeding duodenal diverticulum. Moderate Sedation:      Per Anesthesia Care Recommendation:           Advance  diet as tolerated                           PPI BID for 4 weeks than daily                           Surveillance upper endoscopy in 8-12 weeks to                            assess healing of ulcers                           Avoid NSAIDs and ETOH cessation                           Follow up path Procedure Code(s):        --- Professional ---                           43255, 59, Esophagogastroduodenoscopy, flexible,                            transoral; with control of bleeding, any method                           43239, Esophagogastroduodenoscopy, flexible,                            transoral; with biopsy, single or multiple Diagnosis Code(s):        --- Professional ---                           K25.9, Gastric ulcer, unspecified as acute or  chronic, without hemorrhage or perforation                           K25.4, Chronic or unspecified gastric ulcer with                            hemorrhage                           D62, Acute posthemorrhagic anemia                           K92.1, Melena (includes Hematochezia)                           K57.10, Diverticulosis of small intestine without                            perforation or abscess without bleeding CPT copyright 2022 American Medical Association. All rights reserved. The codes documented in this report are preliminary and upon coder review may  be revised to meet current compliance requirements. Deatrice Dine, MD Deatrice Dine, MD 12/22/2023 1:22:05 PM This report has been signed electronically. Number of Addenda: 0

## 2023-12-22 NOTE — H&P (View-Only) (Signed)
 Gastroenterology Consult   Referring Provider: No ref. provider found Primary Care Physician:  Kennyth Worth [name], MD Primary Gastroenterologist:  [facility] [name], MD)  Patient ID: Linda Knight; [id number]; Jun 03, 1940   Admit date: 12/22/2023  LOS: 0 days   Date of Consultation: 12/22/2023  Reason for Consultation:  GI bleed    History of Present Illness   Linda Knight is a 83 y.o. female with PMH significant for HTN, HLD, CAD, sleep apnea, skin cancer, thyroid  disease, pancreatic cyst, pancreatic exocrine insufficiency (pancreatic elastase of 42 in 2022 but normal later), etoh use most daily, chronic back pain brought to ED by EMS for loss of consciousness, black tarry stools.   ED course: Systolic pressures initially in the 90s, HR 110s. Current BP 145/81. HR 77. Hgb 5.2 (down from 10.7 on 11/22/23) Platelets 196 BUN 70, Cre 1.02 Alb 3.5, Tbili 0.2, AP 58, AST 19, ALT 14 Lipase 55 INR 1.1 Two units of prbcs given  CT angio GI bleed: no active GI bleeding  GI consult: Had injection of toradol and depomedrol from orthopedic surgeon's office for low back pain 11/10. On 11/11 started with N/V/D. Patient reports stools was black. Yesterday was able to drink electrolyte replacement liquids. Felt very weak and lightheaded with standing. Husband had to assist to the restroom. She ultimately passed out while in restroom. Husband found patient, decreased responsiveness next to toilet, she had a lot of black stool in the toilet.   No stool since admission. Patient reports chronic lower abdominal discomfort but recently has had increased abdominal pain over several weeks. She takes ASA 81mg  daily, denies other NSAIDs. Her stools typically fluctuate from constipation/diarrhea. She contributes this to previous pelvic floor surgery she had few years back. She had been on Creon  for about a year but stopped one month ago as she felt like it made her stomach hurt more. No  heartburn. She has some epigastric burning. No hematemesis. No weight loss.   Complains of recent onset lower extremity edema had pending appointment with PCP to discuss.   History of whiskey drink several days per week chronically. No whiskey for one week prior to onset of symptoms. No prior history of DTs, DUIs.   EUS 09/2019: -normal esophagus -gastritis, neg H.pylori -medium sized periampullary duodenal diverticulum -normal non-dilated main pancreatic duct -CBD normal -no peripancreatic adenopathy -two small, innocent appearing pancreatic cysts -somewhat atrophic pancreatic parenchyma throughout  Colonoscopy 10/2020: -colon normal, s/p biopsy, mild edema but essentially normal -diverticulosis -non-bleeding internal hemorrhoids   Prior to Admission medications   Medication Sig Start Date End Date Taking? Authorizing Provider  aspirin EC 81 MG tablet Take 81 mg by mouth daily. Swallow whole.    [provider]  atorvastatin  (LIPITOR) 20 MG tablet Take 1 tablet (20 mg total) by mouth daily. 02/09/22   Kennyth Worth [name], MD  b complex vitamins capsule Take 1 capsule by mouth daily.    [provider]  benazepril  (LOTENSIN ) 20 MG tablet Take 1 tablet (20 mg total) by mouth daily. 11/22/23   Kennyth Worth [name], MD  Cholecalciferol  (VITAMIN D3) 50 MCG (2000 UT) capsule Take 2,000 Units by mouth daily.    [provider]  conjugated estrogens  (PREMARIN ) vaginal cream Place 1 Applicatorful vaginally once a week. 03/21/23   Kennyth Worth [name], MD  hydrochlorothiazide  (HYDRODIURIL ) 25 MG tablet TAKE 1/2 TABLET BY MOUTH EVERY DAY 05/09/23   Kennyth Worth [name], MD  KLOR-CON  M20 20 MEQ  tablet TAKE 1 TABLET BY MOUTH TWICE A DAY 06/15/23   Mallipeddi, Vishnu P, MD  levothyroxine  (SYNTHROID ) 75 MCG tablet Take 1 tablet (75 mcg total) by mouth daily. 11/25/23   Kennyth Worth [name], MD  lipase/protease/amylase (CREON ) 36000 UNITS CPEP capsule Take 2 capsules (72,000 Units total) by mouth 3  (three) times daily with meals. May also take 1 capsule (36,000 Units total) as needed (with snacks - up to 4 snacks daily). PATIENT STOPPED 11/2023 09/26/23   Parker, Caleb M, MD  magnesium oxide (MAG-OX) 400 (240 Mg) MG tablet Take 400 mg by mouth daily.    [provider]  zolpidem  (AMBIEN  CR) 12.5 MG CR tablet TAKE 1 TABLET(12.5 MG) BY MOUTH AT BEDTIME AS NEEDED FOR SLEEP 12/19/23   Kennyth Worth [name], MD    Current Facility-Administered Medications  Medication Dose Route Frequency Provider Last Rate Last Admin   acetaminophen  (TYLENOL ) tablet 650 mg  650 mg Oral Q6H PRN Adefeso, Oladapo, DO       Or   acetaminophen  (TYLENOL ) suppository 650 mg  650 mg Rectal Q6H PRN Adefeso, Oladapo, DO       calcium  carbonate (OS-CAL - dosed in mg of elemental calcium ) tablet 1,250 mg  1 tablet Oral Q breakfast Adefeso, Oladapo, DO   1,250 mg at 12/22/23 0751   ondansetron  (ZOFRAN ) tablet 4 mg  4 mg Oral Q6H PRN Adefeso, Oladapo, DO       Or   ondansetron  (ZOFRAN ) injection 4 mg  4 mg Intravenous Q6H PRN Adefeso, Oladapo, DO       pantoprazole (PROTONIX) injection 40 mg  40 mg Intravenous Q12H Adefeso, Oladapo, DO       Current Outpatient Medications  Medication Sig Dispense Refill   aspirin EC 81 MG tablet Take 81 mg by mouth daily. Swallow whole.     atorvastatin  (LIPITOR) 20 MG tablet Take 1 tablet (20 mg total) by mouth daily. 90 tablet 2   b complex vitamins capsule Take 1 capsule by mouth daily.     benazepril  (LOTENSIN ) 20 MG tablet Take 1 tablet (20 mg total) by mouth daily. 90 tablet 3   Cholecalciferol  (VITAMIN D3) 50 MCG (2000 UT) capsule Take 2,000 Units by mouth daily.     conjugated estrogens  (PREMARIN ) vaginal cream Place 1 Applicatorful vaginally once a week. 42.5 g 12   hydrochlorothiazide  (HYDRODIURIL ) 25 MG tablet TAKE 1/2 TABLET BY MOUTH EVERY DAY 45 tablet 3   KLOR-CON  M20 20 MEQ tablet TAKE 1 TABLET BY MOUTH TWICE A DAY 180 tablet 3   levothyroxine  (SYNTHROID ) 75 MCG tablet  Take 1 tablet (75 mcg total) by mouth daily. 90 tablet 0   lipase/protease/amylase (CREON ) 36000 UNITS CPEP capsule Take 2 capsules (72,000 Units total) by mouth 3 (three) times daily with meals. May also take 1 capsule (36,000 Units total) as needed (with snacks - up to 4 snacks daily). 300 capsule 11   magnesium oxide (MAG-OX) 400 (240 Mg) MG tablet Take 400 mg by mouth daily.     zolpidem  (AMBIEN  CR) 12.5 MG CR tablet TAKE 1 TABLET(12.5 MG) BY MOUTH AT BEDTIME AS NEEDED FOR SLEEP 30 tablet 5    Allergies as of 12/22/2023 - Review Complete 12/22/2023  Allergen Reaction Noted   Tape Rash 09/29/2020    Past Medical History:  Diagnosis Date   Anemia    Arthritis    CAD (coronary artery disease)    High cholesterol    Hypertension    Pancreatic cyst  SCCA (squamous cell carcinoma) of skin 2019   Right Arm [facility])   Sleep apnea    Squamous cell carcinoma of skin 2019   Left Leg [facility])   Thyroid  disease     Past Surgical History:  Procedure Laterality Date   ABDOMINAL HYSTERECTOMY     APPENDECTOMY     BIOPSY  09/27/2019   Procedure: BIOPSY;  Surgeon: Teressa Toribio SQUIBB, MD;  Location: [facility];  Service: Endoscopy;;   BREAST EXCISIONAL BIOPSY Right 1993   BUNIONECTOMY     carcinoma removed Right 09/2020   CYSTOCELE REPAIR     bladder repair   ESOPHAGOGASTRODUODENOSCOPY (EGD) WITH PROPOFOL  N/A 09/27/2019   Procedure: ESOPHAGOGASTRODUODENOSCOPY (EGD) WITH PROPOFOL ;  Surgeon: Teressa Toribio SQUIBB, MD;  Location: [facility];  Service: Endoscopy;  Laterality: N/A;   EUS N/A 09/27/2019   Procedure: UPPER ENDOSCOPIC ULTRASOUND (EUS) RADIAL;  Surgeon: Teressa Toribio SQUIBB, MD;  Location: [facility];  Service: Endoscopy;  Laterality: N/A;   KNEE ARTHROSCOPY  06/2021   TYMPANOPLASTY Left 06/2020   VAGINAL PROLAPSE REPAIR  12/07/2019    Family History  Problem Relation Age of Onset   Hyperlipidemia Mother    Hypertension Mother    Stroke Mother    Lung disease Father     Breast cancer Cousin 66 - 86   Heart disease Brother    Colon cancer Neg Hx     Social History   Socioeconomic History   Marital status: Married    Spouse name: Not on file   Number of children: 3   Years of education: Not on file   Highest education level: Not on file  Occupational History   Occupation: Retired   Tobacco Use   Smoking status: Former    Current packs/day: 0.00    Types: Cigarettes    Quit date: 09/18/1980    Years since quitting: 43.2   Smokeless tobacco: Never  Vaping Use   Vaping status: Never Used  Substance and Sexual Activity   Alcohol use: Not Currently    Comment: no etoh in two weeks (12/22/23), previously drink of whiskey nightly with dinner several nights per week   Drug use: Never   Sexual activity: Not Currently    Comment: 1st intercourse 83 yo-Fewer than 5 partners  Other Topics Concern   Not on file  Social History Narrative   Moved from [place] July 2020   Social Drivers of Health   Financial Resource Strain: Low Risk  (01/12/2023)   Overall Financial Resource Strain (CARDIA)    Difficulty of Paying Living Expenses: Not hard at all  Food Insecurity: No Food Insecurity (01/12/2023)   Hunger Vital Sign    Worried About Running Out of Food in the Last Year: Never true    Ran Out of Food in the Last Year: Never true  Transportation Needs: No Transportation Needs (01/12/2023)   PRAPARE - Administrator, Civil Service (Medical): No    Lack of Transportation (Non-Medical): No  Physical Activity: Insufficiently Active (01/12/2023)   Exercise Vital Sign    Days of Exercise per Week: 3 days    Minutes of Exercise per Session: 20 min  Stress: No Stress Concern Present (01/12/2023)   [employer] of Occupational Health - Occupational Stress Questionnaire    Feeling of Stress : Not at all  Social Connections: Moderately Isolated (01/12/2023)   Social Connection and Isolation Panel    Frequency of Communication with Friends and  Family: More than  three times a week    Frequency of Social Gatherings with Friends and Family: More than three times a week    Attends Religious Services: Never    Database Administrator or Organizations: No    Attends Banker Meetings: Never    Marital Status: Married  Catering Manager Violence: Not At Risk (01/12/2023)   Humiliation, Afraid, Rape, and Kick questionnaire    Fear of Current or Ex-Partner: No    Emotionally Abused: No    Physically Abused: No    Sexually Abused: No     Review of System:   General: Negative for anorexia, weight loss, fever, chills, fatigue, +weakness. Eyes: Negative for vision changes.  ENT: Negative for hoarseness, difficulty swallowing , nasal congestion. CV: Negative for chest pain, angina, palpitations, dyspnea on exertion, +peripheral edema.  Respiratory: Negative for dyspnea at rest, dyspnea on exertion, cough, sputum, wheezing.  GI: See history of present illness. GU:  Negative for dysuria, hematuria, urinary incontinence, urinary frequency, nocturnal urination.  MS: Negative for joint pain. +low back pain.  Derm: Negative for rash or itching.  Neuro: Negative for weakness, abnormal sensation, seizure, frequent headaches, memory loss, confusion.  Psych: Negative for anxiety, depression, suicidal ideation, hallucinations.  Endo: Negative for unusual weight change.  Heme: Negative for bruising or bleeding. Allergy: Negative for rash or hives.      Physical Examination:   Vital signs in last 24 hours: Temp:  [97.6 F (36.4 C)-98.2 F (36.8 C)] 98.2 F (36.8 C) (11/13 0627) Pulse Rate:  [72-114] 77 (11/13 0815) Resp:  [11-27] 17 (11/13 0815) BP: (97-155)/(45-88) 145/81 (11/13 0815) SpO2:  [91 %-100 %] 98 % (11/13 0815) Weight:  [63.9 kg] 63.9 kg (11/13 0224)    General: Well-nourished, well-developed in no acute distress.  Head: Normocephalic, atraumatic.   Eyes: Conjunctiva pink, no icterus. Mouth: Oropharyngeal mucosa  moist and pink , no lesions erythema or exudate. Neck: Supple without thyromegaly, masses, or lymphadenopathy.  Lungs: Clear to auscultation bilaterally.  Heart: Regular rate and rhythm, no murmurs rubs or gallops.  Abdomen: Bowel sounds are normal,   nondistended, no hepatosplenomegaly or masses, no abdominal bruits or hernia , no rebound or guarding.  Mild epigastric tenderness Rectal: not performed Extremities: No lower extremity edema, clubbing, deformity.  Neuro: Alert and oriented x 4 , grossly normal neurologically.  Skin: Warm and dry, no rash or jaundice.   Psych: Alert and cooperative, normal mood and affect.        Intake/Output from previous day: 11/12 0701 - 11/13 0700 In: 2022 [I.V.:22; Blood:1000; IV Piggyback:1000] Out: -  Intake/Output this shift: No intake/output data recorded.  Lab Results:   CBC Recent Labs    12/22/23 0240  WBC 8.1  HGB 5.2*  HCT 15.7*  MCV 100.6*  PLT 196   BMET Recent Labs    12/22/23 0240  NA 135  K 3.6  CL 101  CO2 24  GLUCOSE 153*  BUN 70*  CREATININE 1.02*  CALCIUM  7.9*   LFT Recent Labs    12/22/23 0240  BILITOT 0.2  ALKPHOS 58  AST 19  ALT 14  PROT 5.3*  ALBUMIN 3.5    Lipase Recent Labs    12/22/23 0240  LIPASE 55*    PT/INR Recent Labs    12/22/23 0319  LABPROT 14.8  INR 1.1   Lab Results  Component Value Date   VITAMINB12 255 12/22/2023   Lab Results  Component Value Date   FOLATE 6.0  12/22/2023      Hepatitis Panel No results for input(s): HEPBSAG, HCVAB, HEPAIGM, HEPBIGM in the last 72 hours.   Imaging Studies:   CT ANGIO GI BLEED Result Date: 12/22/2023 EXAM: CTA ABDOMEN AND PELVIS WITH CONTRAST 12/22/2023 03:57:41 AM TECHNIQUE: CTA images of the abdomen and pelvis with intravenous contrast. 100 mL iohexol (OMNIPAQUE) 350 MG/ML injection. Three-dimensional MIP/volume rendered formations were performed. Automated exposure control, iterative reconstruction, and/or weight  based adjustment of the mA/kV was utilized to reduce the radiation dose to as low as reasonably achievable. COMPARISON: MRI abdomen dated 11/02/2023. CLINICAL HISTORY: Lower GI bleed (Ped 0-17y); hypotension and melena and abdominal pain. FINDINGS: VASCULATURE: GI BLEED: No intraluminal spillage of contrast to suggest active GI bleeding. AORTA: Atherosclerotic calcifications of the abdominal aorta, although patent. No abdominal aortic aneurysm. No dissection. CELIAC TRUNK: No occlusion or significant stenosis. SUPERIOR MESENTERIC ARTERY: No occlusion or significant stenosis. INFERIOR MESENTERIC ARTERY: No occlusion or significant stenosis. RENAL ARTERIES: No occlusion or significant stenosis. ILIAC ARTERIES: No occlusion or significant stenosis. ABDOMEN/PELVIS: LOWER CHEST: Mild eventration of the right hemidiaphragm. LIVER: Hepatic cysts measuring up to 2.8 cm, benign. GALLBLADDER AND BILE DUCTS: Gallbladder is unremarkable. No biliary ductal dilatation. SPLEEN: The spleen is unremarkable. PANCREAS: Small pancreatic cysts measuring up to 7 mm, likely benign. Given the patient's age, no follow-up is recommended. ADRENAL GLANDS: Bilateral adrenal glands demonstrate no acute abnormality. KIDNEYS, URETERS AND BLADDER: 17 mm simple left renal cyst, benign. No follow-up is recommended. No stones in the kidneys or ureters. No hydronephrosis. No perinephric or periureteral stranding. Mild bladder wall thickening (image 205), correlate for cystitis. GI AND BOWEL: Stomach and duodenal sweep demonstrate no acute abnormality. The colon is decompressed. There is no bowel obstruction. No abnormal bowel wall thickening or distension. REPRODUCTIVE: Status post hysterectomy. PERITONEUM AND RETROPERITONEUM: No ascites or free air. LYMPH NODES: No lymphadenopathy. BONES AND SOFT TISSUES: No acute abnormality of the bones. No acute soft tissue abnormality. IMPRESSION: 1. No active GI bleeding. 2. Mild bladder wall thickening,  equivocal. Correlate for cystitis. Electronically signed by: Pinkie Pebbles MD 12/22/2023 04:04 AM EST RP Workstation: [id number]   DG Chest Port 1 View Result Date: 12/22/2023 EXAM: 1 VIEW(S) XRAY OF THE CHEST 12/22/2023 02:57:00 AM COMPARISON: 08/05/2022 CLINICAL HISTORY: emesis FINDINGS: LUNGS AND PLEURA: No focal pulmonary opacity. No pleural effusion. No pneumothorax. HEART AND MEDIASTINUM: No acute abnormality of the cardiac and mediastinal silhouettes. BONES AND SOFT TISSUES: No acute osseous abnormality. IMPRESSION: 1. No acute cardiopulmonary process. Electronically signed by: Pinkie Pebbles MD 12/22/2023 03:00 AM EST RP Workstation: [id number]  [5 week]  Assessment:   83 yo female with PMH significant for HTN, HLD, CAD, sleep apnea, skin cancer, thyroid  disease, pancreatic cysts, pancreatic exocrine insufficiency (pancreatic elastase of 42 in 2022 but normal later), etoh use most days, chronic back pain brought to ED by EMS for loss of consciousness, black tarry stools.   Acute on chronic anemia/black stools: Presents with syncopal episodes due to UGI bleed, dehydration with recent N/V/D and black stools. Denies NSAIDs at home. Takes ASA 81mg  daily. Whiskey most days, 1-2 drinks with dinner chronically. Recent shot of depomedrol, toradol. Patient recently noting some worsening of her chronic abdominal pain both epigastric and pelvic. No etoh for one week prior to onset of symptoms. Suspect UGI bleeding, differential including PUD, Dieulafoy lesion, AVMs, less likely malignancy or secondary to portal HTN due to no imaging evidence of portal HTN or cirrhosis. CT GI bleeding scan was negative.  -Hgb  5.2 (down from 10.7 one month prior) -post transfusion H/H pending -BUN 70 -B12 low normal, 255 -Recommend EGD today for further evaluation.   Plan:   NPO. IV pantoprazole BID Obtain post transfusion H/H. EGD today.  I have discussed the risks, alternatives, benefits with regards to but  not limited to the risk of reaction to medication, bleeding, infection, perforation and the patient is agreeable to proceed. Written consent to be obtained.    LOS: 0 days   We would like to thank you for the opportunity to participate in the care of Janai K Hayes Diaz.  Sonny RAMAN. Ezzard RIGGERS [facility] [phone] 11/13/20259:02 AM

## 2023-12-22 NOTE — ED Notes (Signed)
 Pt went to OR for scope for GI bleed issues.

## 2023-12-22 NOTE — Hospital Course (Signed)
 83 y.o. female with medical history significant of hyperlipidemia, pancreatic cyst, CAD, hypothyroidism, chronic back pain who presents to the emergency department from home via EMS due to loss of consciousness.  Patient states that she received 2 injections (60 mg of Toradol and 80 mg of Depo-Medrol  IM) from orthopedic surgeon's office due to low back pain on Monday ((11/10).  On Tuesday (11/11) patient states that she felt sick by having several episodes of nausea and vomiting as well as diarrhea with difficulty in being able to tolerate any oral intake due to vomiting.  Yesterday (11/12), patient states that she must have passed out while in the restroom, since husband found her with decreased responsiveness next to the toilet and she had a lot of black stool.  She complained of generalized weakness, but denies chest pain, shortness of breath, fever, chills.

## 2023-12-22 NOTE — Plan of Care (Signed)

## 2023-12-22 NOTE — Consult Note (Signed)
 Gastroenterology Consult   Referring Provider: No ref. provider found Primary Care Physician:  Kennyth Worth [name], MD Primary Gastroenterologist:  [facility] [name], MD)  Patient ID: Linda Knight; [id number]; Jun 03, 1940   Admit date: 12/22/2023  LOS: 0 days   Date of Consultation: 12/22/2023  Reason for Consultation:  GI bleed    History of Present Illness   Linda Knight is a 83 y.o. female with PMH significant for HTN, HLD, CAD, sleep apnea, skin cancer, thyroid  disease, pancreatic cyst, pancreatic exocrine insufficiency (pancreatic elastase of 42 in 2022 but normal later), etoh use most daily, chronic back pain brought to ED by EMS for loss of consciousness, black tarry stools.   ED course: Systolic pressures initially in the 90s, HR 110s. Current BP 145/81. HR 77. Hgb 5.2 (down from 10.7 on 11/22/23) Platelets 196 BUN 70, Cre 1.02 Alb 3.5, Tbili 0.2, AP 58, AST 19, ALT 14 Lipase 55 INR 1.1 Two units of prbcs given  CT angio GI bleed: no active GI bleeding  GI consult: Had injection of toradol and depomedrol from orthopedic surgeon's office for low back pain 11/10. On 11/11 started with N/V/D. Patient reports stools was black. Yesterday was able to drink electrolyte replacement liquids. Felt very weak and lightheaded with standing. Husband had to assist to the restroom. She ultimately passed out while in restroom. Husband found patient, decreased responsiveness next to toilet, she had a lot of black stool in the toilet.   No stool since admission. Patient reports chronic lower abdominal discomfort but recently has had increased abdominal pain over several weeks. She takes ASA 81mg  daily, denies other NSAIDs. Her stools typically fluctuate from constipation/diarrhea. She contributes this to previous pelvic floor surgery she had few years back. She had been on Creon  for about a year but stopped one month ago as she felt like it made her stomach hurt more. No  heartburn. She has some epigastric burning. No hematemesis. No weight loss.   Complains of recent onset lower extremity edema had pending appointment with PCP to discuss.   History of whiskey drink several days per week chronically. No whiskey for one week prior to onset of symptoms. No prior history of DTs, DUIs.   EUS 09/2019: -normal esophagus -gastritis, neg H.pylori -medium sized periampullary duodenal diverticulum -normal non-dilated main pancreatic duct -CBD normal -no peripancreatic adenopathy -two small, innocent appearing pancreatic cysts -somewhat atrophic pancreatic parenchyma throughout  Colonoscopy 10/2020: -colon normal, s/p biopsy, mild edema but essentially normal -diverticulosis -non-bleeding internal hemorrhoids   Prior to Admission medications   Medication Sig Start Date End Date Taking? Authorizing Provider  aspirin EC 81 MG tablet Take 81 mg by mouth daily. Swallow whole.    [provider]  atorvastatin  (LIPITOR) 20 MG tablet Take 1 tablet (20 mg total) by mouth daily. 02/09/22   Kennyth Worth [name], MD  b complex vitamins capsule Take 1 capsule by mouth daily.    [provider]  benazepril  (LOTENSIN ) 20 MG tablet Take 1 tablet (20 mg total) by mouth daily. 11/22/23   Kennyth Worth [name], MD  Cholecalciferol  (VITAMIN D3) 50 MCG (2000 UT) capsule Take 2,000 Units by mouth daily.    [provider]  conjugated estrogens  (PREMARIN ) vaginal cream Place 1 Applicatorful vaginally once a week. 03/21/23   Kennyth Worth [name], MD  hydrochlorothiazide  (HYDRODIURIL ) 25 MG tablet TAKE 1/2 TABLET BY MOUTH EVERY DAY 05/09/23   Kennyth Worth [name], MD  KLOR-CON  M20 20 MEQ  tablet TAKE 1 TABLET BY MOUTH TWICE A DAY 06/15/23   Mallipeddi, Vishnu P, MD  levothyroxine  (SYNTHROID ) 75 MCG tablet Take 1 tablet (75 mcg total) by mouth daily. 11/25/23   Kennyth Worth [name], MD  lipase/protease/amylase (CREON ) 36000 UNITS CPEP capsule Take 2 capsules (72,000 Units total) by mouth 3  (three) times daily with meals. May also take 1 capsule (36,000 Units total) as needed (with snacks - up to 4 snacks daily). PATIENT STOPPED 11/2023 09/26/23   Parker, Caleb M, MD  magnesium oxide (MAG-OX) 400 (240 Mg) MG tablet Take 400 mg by mouth daily.    [provider]  zolpidem  (AMBIEN  CR) 12.5 MG CR tablet TAKE 1 TABLET(12.5 MG) BY MOUTH AT BEDTIME AS NEEDED FOR SLEEP 12/19/23   Kennyth Worth [name], MD    Current Facility-Administered Medications  Medication Dose Route Frequency Provider Last Rate Last Admin   acetaminophen  (TYLENOL ) tablet 650 mg  650 mg Oral Q6H PRN Adefeso, Oladapo, DO       Or   acetaminophen  (TYLENOL ) suppository 650 mg  650 mg Rectal Q6H PRN Adefeso, Oladapo, DO       calcium  carbonate (OS-CAL - dosed in mg of elemental calcium ) tablet 1,250 mg  1 tablet Oral Q breakfast Adefeso, Oladapo, DO   1,250 mg at 12/22/23 0751   ondansetron  (ZOFRAN ) tablet 4 mg  4 mg Oral Q6H PRN Adefeso, Oladapo, DO       Or   ondansetron  (ZOFRAN ) injection 4 mg  4 mg Intravenous Q6H PRN Adefeso, Oladapo, DO       pantoprazole (PROTONIX) injection 40 mg  40 mg Intravenous Q12H Adefeso, Oladapo, DO       Current Outpatient Medications  Medication Sig Dispense Refill   aspirin EC 81 MG tablet Take 81 mg by mouth daily. Swallow whole.     atorvastatin  (LIPITOR) 20 MG tablet Take 1 tablet (20 mg total) by mouth daily. 90 tablet 2   b complex vitamins capsule Take 1 capsule by mouth daily.     benazepril  (LOTENSIN ) 20 MG tablet Take 1 tablet (20 mg total) by mouth daily. 90 tablet 3   Cholecalciferol  (VITAMIN D3) 50 MCG (2000 UT) capsule Take 2,000 Units by mouth daily.     conjugated estrogens  (PREMARIN ) vaginal cream Place 1 Applicatorful vaginally once a week. 42.5 g 12   hydrochlorothiazide  (HYDRODIURIL ) 25 MG tablet TAKE 1/2 TABLET BY MOUTH EVERY DAY 45 tablet 3   KLOR-CON  M20 20 MEQ tablet TAKE 1 TABLET BY MOUTH TWICE A DAY 180 tablet 3   levothyroxine  (SYNTHROID ) 75 MCG tablet  Take 1 tablet (75 mcg total) by mouth daily. 90 tablet 0   lipase/protease/amylase (CREON ) 36000 UNITS CPEP capsule Take 2 capsules (72,000 Units total) by mouth 3 (three) times daily with meals. May also take 1 capsule (36,000 Units total) as needed (with snacks - up to 4 snacks daily). 300 capsule 11   magnesium oxide (MAG-OX) 400 (240 Mg) MG tablet Take 400 mg by mouth daily.     zolpidem  (AMBIEN  CR) 12.5 MG CR tablet TAKE 1 TABLET(12.5 MG) BY MOUTH AT BEDTIME AS NEEDED FOR SLEEP 30 tablet 5    Allergies as of 12/22/2023 - Review Complete 12/22/2023  Allergen Reaction Noted   Tape Rash 09/29/2020    Past Medical History:  Diagnosis Date   Anemia    Arthritis    CAD (coronary artery disease)    High cholesterol    Hypertension    Pancreatic cyst  SCCA (squamous cell carcinoma) of skin 2019   Right Arm [facility])   Sleep apnea    Squamous cell carcinoma of skin 2019   Left Leg [facility])   Thyroid  disease     Past Surgical History:  Procedure Laterality Date   ABDOMINAL HYSTERECTOMY     APPENDECTOMY     BIOPSY  09/27/2019   Procedure: BIOPSY;  Surgeon: Teressa Toribio SQUIBB, MD;  Location: [facility];  Service: Endoscopy;;   BREAST EXCISIONAL BIOPSY Right 1993   BUNIONECTOMY     carcinoma removed Right 09/2020   CYSTOCELE REPAIR     bladder repair   ESOPHAGOGASTRODUODENOSCOPY (EGD) WITH PROPOFOL  N/A 09/27/2019   Procedure: ESOPHAGOGASTRODUODENOSCOPY (EGD) WITH PROPOFOL ;  Surgeon: Teressa Toribio SQUIBB, MD;  Location: [facility] ENDOS[facility];  Service: Endoscopy;  Laterality: N/A;   EUS N/A 09/27/2019   Procedure: UPPER ENDOSCOPIC ULTRASOUND (EUS) RADIAL;  Surgeon: Teressa Toribio SQUIBB, MD;  Location: [facility];  Service: Endoscopy;  Laterality: N/A;   KNEE ARTHROSCOPY  06/2021   TYMPANOPLASTY Left 06/2020   VAGINAL PROLAPSE REPAIR  12/07/2019    Family History  Problem Relation Age of Onset   Hyperlipidemia Mother    Hypertension Mother    Stroke Mother    Lung disease Father     Breast cancer Cousin 66 - 86   Heart disease Brother    Colon cancer Neg Hx     Social History   Socioeconomic History   Marital status: Married    Spouse name: Not on file   Number of children: 3   Years of education: Not on file   Highest education level: Not on file  Occupational History   Occupation: Retired   Tobacco Use   Smoking status: Former    Current packs/day: 0.00    Types: Cigarettes    Quit date: 09/18/1980    Years since quitting: 43.2   Smokeless tobacco: Never  Vaping Use   Vaping status: Never Used  Substance and Sexual Activity   Alcohol use: Not Currently    Comment: no etoh in two weeks (12/22/23), previously drink of whiskey nightly with dinner several nights per week   Drug use: Never   Sexual activity: Not Currently    Comment: 1st intercourse 83 yo-Fewer than 5 partners  Other Topics Concern   Not on file  Social History Narrative   Moved from [place] July 2020   Social Drivers of Health   Financial Resource Strain: Low Risk  (01/12/2023)   Overall Financial Resource Strain (CARDIA)    Difficulty of Paying Living Expenses: Not hard at all  Food Insecurity: No Food Insecurity (01/12/2023)   Hunger Vital Sign    Worried About Running Out of Food in the Last Year: Never true    Ran Out of Food in the Last Year: Never true  Transportation Needs: No Transportation Needs (01/12/2023)   PRAPARE - Administrator, Civil Service (Medical): No    Lack of Transportation (Non-Medical): No  Physical Activity: Insufficiently Active (01/12/2023)   Exercise Vital Sign    Days of Exercise per Week: 3 days    Minutes of Exercise per Session: 20 min  Stress: No Stress Concern Present (01/12/2023)   [employer] of Occupational Health - Occupational Stress Questionnaire    Feeling of Stress : Not at all  Social Connections: Moderately Isolated (01/12/2023)   Social Connection and Isolation Panel    Frequency of Communication with Friends and  Family: More than  three times a week    Frequency of Social Gatherings with Friends and Family: More than three times a week    Attends Religious Services: Never    Database Administrator or Organizations: No    Attends Banker Meetings: Never    Marital Status: Married  Catering Manager Violence: Not At Risk (01/12/2023)   Humiliation, Afraid, Rape, and Kick questionnaire    Fear of Current or Ex-Partner: No    Emotionally Abused: No    Physically Abused: No    Sexually Abused: No     Review of System:   General: Negative for anorexia, weight loss, fever, chills, fatigue, +weakness. Eyes: Negative for vision changes.  ENT: Negative for hoarseness, difficulty swallowing , nasal congestion. CV: Negative for chest pain, angina, palpitations, dyspnea on exertion, +peripheral edema.  Respiratory: Negative for dyspnea at rest, dyspnea on exertion, cough, sputum, wheezing.  GI: See history of present illness. GU:  Negative for dysuria, hematuria, urinary incontinence, urinary frequency, nocturnal urination.  MS: Negative for joint pain. +low back pain.  Derm: Negative for rash or itching.  Neuro: Negative for weakness, abnormal sensation, seizure, frequent headaches, memory loss, confusion.  Psych: Negative for anxiety, depression, suicidal ideation, hallucinations.  Endo: Negative for unusual weight change.  Heme: Negative for bruising or bleeding. Allergy: Negative for rash or hives.      Physical Examination:   Vital signs in last 24 hours: Temp:  [97.6 F (36.4 C)-98.2 F (36.8 C)] 98.2 F (36.8 C) (11/13 0627) Pulse Rate:  [72-114] 77 (11/13 0815) Resp:  [11-27] 17 (11/13 0815) BP: (97-155)/(45-88) 145/81 (11/13 0815) SpO2:  [91 %-100 %] 98 % (11/13 0815) Weight:  [63.9 kg] 63.9 kg (11/13 0224)    General: Well-nourished, well-developed in no acute distress.  Head: Normocephalic, atraumatic.   Eyes: Conjunctiva pink, no icterus. Mouth: Oropharyngeal mucosa  moist and pink , no lesions erythema or exudate. Neck: Supple without thyromegaly, masses, or lymphadenopathy.  Lungs: Clear to auscultation bilaterally.  Heart: Regular rate and rhythm, no murmurs rubs or gallops.  Abdomen: Bowel sounds are normal,   nondistended, no hepatosplenomegaly or masses, no abdominal bruits or hernia , no rebound or guarding.  Mild epigastric tenderness Rectal: not performed Extremities: No lower extremity edema, clubbing, deformity.  Neuro: Alert and oriented x 4 , grossly normal neurologically.  Skin: Warm and dry, no rash or jaundice.   Psych: Alert and cooperative, normal mood and affect.        Intake/Output from previous day: 11/12 0701 - 11/13 0700 In: 2022 [I.V.:22; Blood:1000; IV Piggyback:1000] Out: -  Intake/Output this shift: No intake/output data recorded.  Lab Results:   CBC Recent Labs    12/22/23 0240  WBC 8.1  HGB 5.2*  HCT 15.7*  MCV 100.6*  PLT 196   BMET Recent Labs    12/22/23 0240  NA 135  K 3.6  CL 101  CO2 24  GLUCOSE 153*  BUN 70*  CREATININE 1.02*  CALCIUM  7.9*   LFT Recent Labs    12/22/23 0240  BILITOT 0.2  ALKPHOS 58  AST 19  ALT 14  PROT 5.3*  ALBUMIN 3.5    Lipase Recent Labs    12/22/23 0240  LIPASE 55*    PT/INR Recent Labs    12/22/23 0319  LABPROT 14.8  INR 1.1   Lab Results  Component Value Date   VITAMINB12 255 12/22/2023   Lab Results  Component Value Date   FOLATE 6.0  12/22/2023      Hepatitis Panel No results for input(s): HEPBSAG, HCVAB, HEPAIGM, HEPBIGM in the last 72 hours.   Imaging Studies:   CT ANGIO GI BLEED Result Date: 12/22/2023 EXAM: CTA ABDOMEN AND PELVIS WITH CONTRAST 12/22/2023 03:57:41 AM TECHNIQUE: CTA images of the abdomen and pelvis with intravenous contrast. 100 mL iohexol (OMNIPAQUE) 350 MG/ML injection. Three-dimensional MIP/volume rendered formations were performed. Automated exposure control, iterative reconstruction, and/or weight  based adjustment of the mA/kV was utilized to reduce the radiation dose to as low as reasonably achievable. COMPARISON: MRI abdomen dated 11/02/2023. CLINICAL HISTORY: Lower GI bleed (Ped 0-17y); hypotension and melena and abdominal pain. FINDINGS: VASCULATURE: GI BLEED: No intraluminal spillage of contrast to suggest active GI bleeding. AORTA: Atherosclerotic calcifications of the abdominal aorta, although patent. No abdominal aortic aneurysm. No dissection. CELIAC TRUNK: No occlusion or significant stenosis. SUPERIOR MESENTERIC ARTERY: No occlusion or significant stenosis. INFERIOR MESENTERIC ARTERY: No occlusion or significant stenosis. RENAL ARTERIES: No occlusion or significant stenosis. ILIAC ARTERIES: No occlusion or significant stenosis. ABDOMEN/PELVIS: LOWER CHEST: Mild eventration of the right hemidiaphragm. LIVER: Hepatic cysts measuring up to 2.8 cm, benign. GALLBLADDER AND BILE DUCTS: Gallbladder is unremarkable. No biliary ductal dilatation. SPLEEN: The spleen is unremarkable. PANCREAS: Small pancreatic cysts measuring up to 7 mm, likely benign. Given the patient's age, no follow-up is recommended. ADRENAL GLANDS: Bilateral adrenal glands demonstrate no acute abnormality. KIDNEYS, URETERS AND BLADDER: 17 mm simple left renal cyst, benign. No follow-up is recommended. No stones in the kidneys or ureters. No hydronephrosis. No perinephric or periureteral stranding. Mild bladder wall thickening (image 205), correlate for cystitis. GI AND BOWEL: Stomach and duodenal sweep demonstrate no acute abnormality. The colon is decompressed. There is no bowel obstruction. No abnormal bowel wall thickening or distension. REPRODUCTIVE: Status post hysterectomy. PERITONEUM AND RETROPERITONEUM: No ascites or free air. LYMPH NODES: No lymphadenopathy. BONES AND SOFT TISSUES: No acute abnormality of the bones. No acute soft tissue abnormality. IMPRESSION: 1. No active GI bleeding. 2. Mild bladder wall thickening,  equivocal. Correlate for cystitis. Electronically signed by: Pinkie Pebbles MD 12/22/2023 04:04 AM EST RP Workstation: [id number]   DG Chest Port 1 View Result Date: 12/22/2023 EXAM: 1 VIEW(S) XRAY OF THE CHEST 12/22/2023 02:57:00 AM COMPARISON: 08/05/2022 CLINICAL HISTORY: emesis FINDINGS: LUNGS AND PLEURA: No focal pulmonary opacity. No pleural effusion. No pneumothorax. HEART AND MEDIASTINUM: No acute abnormality of the cardiac and mediastinal silhouettes. BONES AND SOFT TISSUES: No acute osseous abnormality. IMPRESSION: 1. No acute cardiopulmonary process. Electronically signed by: Pinkie Pebbles MD 12/22/2023 03:00 AM EST RP Workstation: [id number]  [5 week]  Assessment:   83 yo female with PMH significant for HTN, HLD, CAD, sleep apnea, skin cancer, thyroid  disease, pancreatic cysts, pancreatic exocrine insufficiency (pancreatic elastase of 42 in 2022 but normal later), etoh use most days, chronic back pain brought to ED by EMS for loss of consciousness, black tarry stools.   Acute on chronic anemia/black stools: Presents with syncopal episodes due to UGI bleed, dehydration with recent N/V/D and black stools. Denies NSAIDs at home. Takes ASA 81mg  daily. Whiskey most days, 1-2 drinks with dinner chronically. Recent shot of depomedrol, toradol. Patient recently noting some worsening of her chronic abdominal pain both epigastric and pelvic. No etoh for one week prior to onset of symptoms. Suspect UGI bleeding, differential including PUD, Dieulafoy lesion, AVMs, less likely malignancy or secondary to portal HTN due to no imaging evidence of portal HTN or cirrhosis. CT GI bleeding scan was negative.  -Hgb  5.2 (down from 10.7 one month prior) -post transfusion H/H pending -BUN 70 -B12 low normal, 255 -Recommend EGD today for further evaluation.   Plan:   NPO. IV pantoprazole BID Obtain post transfusion H/H. EGD today.  I have discussed the risks, alternatives, benefits with regards to but  not limited to the risk of reaction to medication, bleeding, infection, perforation and the patient is agreeable to proceed. Written consent to be obtained.    LOS: 0 days   We would like to thank you for the opportunity to participate in the care of Linda Knight.  Sonny RAMAN. Ezzard RIGGERS [facility] [phone] 11/13/20259:02 AM

## 2023-12-23 ENCOUNTER — Encounter (HOSPITAL_COMMUNITY): Payer: Self-pay | Admitting: Gastroenterology

## 2023-12-23 DIAGNOSIS — K922 Gastrointestinal hemorrhage, unspecified: Secondary | ICD-10-CM | POA: Diagnosis present

## 2023-12-23 DIAGNOSIS — R7881 Bacteremia: Secondary | ICD-10-CM

## 2023-12-23 LAB — COMPREHENSIVE METABOLIC PANEL WITH GFR
ALT: 18 U/L (ref 0–44)
AST: 27 U/L (ref 15–41)
Albumin: 4 g/dL (ref 3.5–5.0)
Alkaline Phosphatase: 65 U/L (ref 38–126)
Anion gap: 8 (ref 5–15)
BUN: 23 mg/dL (ref 8–23)
CO2: 29 mmol/L (ref 22–32)
Calcium: 8.9 mg/dL (ref 8.9–10.3)
Chloride: 105 mmol/L (ref 98–111)
Creatinine, Ser: 0.91 mg/dL (ref 0.44–1.00)
GFR, Estimated: >60 mL/min (ref >60)
Glucose, Bld: 103 mg/dL — ABNORMAL HIGH (ref 70–99)
Potassium: 4.2 mmol/L (ref 3.5–5.1)
Sodium: 142 mmol/L (ref 135–145)
Total Bilirubin: 0.5 mg/dL (ref 0.0–1.2)
Total Protein: 6.1 g/dL — ABNORMAL LOW (ref 6.5–8.1)

## 2023-12-23 LAB — CBC
HCT: 33.1 % — ABNORMAL LOW (ref 36.0–46.0)
Hemoglobin: 11.5 g/dL — ABNORMAL LOW (ref 12.0–15.0)
MCH: 31.9 pg (ref 26.0–34.0)
MCHC: 34.7 g/dL (ref 30.0–36.0)
MCV: 91.7 fL (ref 80.0–100.0)
Platelets: 165 K/uL (ref 150–400)
RBC: 3.61 MIL/uL — ABNORMAL LOW (ref 3.87–5.11)
RDW: 14.5 % (ref 11.5–15.5)
WBC: 7.3 K/uL (ref 4.0–10.5)
nRBC: 0 % (ref 0.0–0.2)

## 2023-12-23 LAB — SURGICAL PATHOLOGY

## 2023-12-23 MED ORDER — VANCOMYCIN HCL 750 MG/150ML IV SOLN
750.0000 mg | INTRAVENOUS | Status: DC
  Administered 2023-12-23: 750 mg via INTRAVENOUS
  Filled 2023-12-23: qty 150

## 2023-12-23 MED ORDER — VANCOMYCIN HCL IN DEXTROSE 1-5 GM/200ML-% IV SOLN
1000.0000 mg | Freq: Once | INTRAVENOUS | Status: AC
  Administered 2023-12-23: 1000 mg via INTRAVENOUS
  Filled 2023-12-23: qty 200

## 2023-12-23 NOTE — Progress Notes (Signed)
 Mobility Specialist Progress Note:    12/23/23 0940  Mobility  Activity Ambulated with assistance  Level of Assistance Modified independent, requires aide device or extra time  Assistive Device None  Distance Ambulated (ft) 200 ft  Range of Motion/Exercises Active;All extremities  Activity Response Tolerated well  Mobility Referral Yes  Mobility visit 1 Mobility  Mobility Specialist Start Time (ACUTE ONLY) 0940  Mobility Specialist Stop Time (ACUTE ONLY) 1000  Mobility Specialist Time Calculation (min) (ACUTE ONLY) 20 min   Pt received in bed, agreeable to mobility. ModI to stand and ambulate with no AD. Tolerated well, asx throughout. Returned supine, all needs met.  Monzerrath Mcburney Mobility Specialist Please contact via Special Educational Needs Teacher or  Rehab office at [phone]

## 2023-12-23 NOTE — Progress Notes (Signed)
 PROGRESS NOTE   Linda Knight  FMW:[id number] DOB: May 29, 1940 DOA: 12/22/2023 PCP: Kennyth Worth [name], MD   Chief Complaint  Patient presents with   Loss of Consciousness   Level of care: Med-Surg  Brief Admission History:  83 y.o. female with medical history significant of hyperlipidemia, pancreatic cyst, CAD, hypothyroidism, chronic back pain who presents to the emergency department from home via EMS due to loss of consciousness.  Patient states that she received 2 injections (60 mg of Toradol and 80 mg of Depo-Medrol  IM) from orthopedic surgeon's office due to low back pain on Monday ((11/10).  On Tuesday (11/11) patient states that she felt sick by having several episodes of nausea and vomiting as well as diarrhea with difficulty in being able to tolerate any oral intake due to vomiting.  Yesterday (11/12), patient states that she must have passed out while in the restroom, since husband found her with decreased responsiveness next to the toilet and she had a lot of black stool.  She complained of generalized weakness, but denies chest pain, shortness of breath, fever, chills.     Assessment and Plan:  Acute on chronic anemia -- s/p EGD 11/13 with Dr. Cinderella with findings of normal esophagus, nonbleeding linear gastric ulcers with a clean ulcer base in the incisura and antrum status postbiopsy, nonbleeding gastric ulcer with a nonbleeding visible vessel with MR conditional clip placed, normal duodenal bulb, and nonbleeding duodenal diverticulum -- GI team recommending repeat upper endoscopy in 8 to 12 weeks -- Pathology pending -- She is now on PPI twice daily for 4 weeks then once daily afterwards -- she is advised to avoid NSAIDs per GI -- GI recommending H/H in 4 weeks with her primary GI and follow up with her primary GI in 3-4 weeks  Dehydration IV hydration was provided  Positive blood culture  -- micro lab reporting MRSE  -- blood culture was repeated yesterday evening --  unsure if this is a contaminant  -- she was started on IV vancomycin pending further BC results  -- so far, no growth from repeat cultures but <12  hours  -- hold DC home today, recheck growth on repeat [facility] tomorrow  -- repeat BCs were collected before any antibiotics given    Nausea and vomiting Continue Zofran  as needed   Chronic low back pain Patient has lumbar spondylosis with left leg radiculopathy, left hip mild osteoarthritis. She received 60 mg of Toradol and 80 mg of Depo-Medrol  IM at orthopedics office on Monday 11/12   Elevated MCV MCV 100.6, folate and vitamin B12 levels will be checked   Mixed hyperlipidemia/CAD Continue statin   Acquired hypothyroidism Continue Synthroid    Pancreatic cyst Continue Creon  Per GI team, given age no follow up recommended    Consultants:  GI Procedures:  EGD 11/13  Antimicrobials:    Subjective: Pt eager to go home, denies fever and chills  Objective: Vitals:   12/22/23 1926 12/23/23 0027 12/23/23 0407 12/23/23 1319  BP: 129/67 118/60 (!) 150/72 (!) 152/75  Pulse: 76 73 69 70  Resp: 20 20 20 20   Temp: 98.2 F (36.8 C) 98.2 F (36.8 C) 97.8 F (36.6 C) 97.8 F (36.6 C)  TempSrc: Oral Oral Oral Oral  SpO2: 95% 97% 100% 98%  Weight:      Height:        Intake/Output Summary (Last 24 hours) at 12/23/2023 1501 Last data filed at 12/23/2023 1014 Gross per 24 hour  Intake 440 ml  Output --  Net 440 ml   Filed Weights   12/22/23 0224 12/22/23 1119  Weight: 63.9 kg 63.9 kg   Examination:  General exam: Appears calm and comfortable  Respiratory system: Clear to auscultation. Respiratory effort normal. Cardiovascular system: normal S1 & S2 heard. No JVD, murmurs, rubs, gallops or clicks. No pedal edema. Gastrointestinal system: Abdomen is nondistended, soft and nontender. No organomegaly or masses felt. Normal bowel sounds heard. Central nervous system: Alert and oriented. No focal neurological deficits. Extremities:  Symmetric 5 x 5 power. Skin: No rashes, lesions or ulcers. Psychiatry: Judgement and insight appear normal. Mood & affect appropriate.   Data Reviewed: I have personally reviewed following labs and imaging studies  CBC: Recent Labs  Lab 12/22/23 0240 12/22/23 0905 12/23/23 0347  WBC 8.1  --  7.3  HGB 5.2* 12.3 11.5*  HCT 15.7* 34.4* 33.1*  MCV 100.6*  --  91.7  PLT 196  --  165    Basic Metabolic Panel: Recent Labs  Lab 12/22/23 0240 12/23/23 0347  NA 135 142  K 3.6 4.2  CL 101 105  CO2 24 29  GLUCOSE 153* 103*  BUN 70* 23  CREATININE 1.02* 0.91  CALCIUM  7.9* 8.9  MG 1.8  --   PHOS 2.4*  --     CBG: No results for input(s): GLUCAP in the last 168 hours.  Recent Results (from the past 240 hours)  Culture, blood (Routine X 2) w Reflex to ID Panel     Status: None (Preliminary result)   Collection Time: 12/22/23  3:19 AM   Specimen: BLOOD  Result Value Ref Range Status   Specimen Description BLOOD BLOOD RIGHT HAND  Final   Special Requests   Final    BOTTLES DRAWN AEROBIC AND ANAEROBIC Blood Culture results may not be optimal due to an inadequate volume of blood received in culture bottles   Culture   Final    NO GROWTH 1 DAY Performed at [facility], [address], [place], [place] [address]    Report Status PENDING  Incomplete  Culture, blood (Routine X 2) w Reflex to ID Panel     Status: None (Preliminary result)   Collection Time: 12/22/23  3:19 AM   Specimen: BLOOD  Result Value Ref Range Status   Specimen Description   Final    BLOOD BLOOD LEFT HAND Performed at [facility], [address] [place], [place] [address]    Special Requests   Final    BOTTLES DRAWN AEROBIC AND ANAEROBIC Blood Culture results may not be optimal due to an inadequate volume of blood received in culture bottles Performed at [facility], [address], [place], [place] [address]    Culture  Setup Time   Final    IN BOTH AEROBIC AND ANAEROBIC BOTTLES GRAM POSITIVE  COCCI Gram Stain Report Called to,Read Back By and Verified With: CATHERINE FULCHER @1838  ON 12/22/23 C VARNER CRITICAL RESULT CALLED TO, READ BACK BY AND VERIFIED WITH: M CRABALLO RN 12/22/2023 @ 2343 BY AB    Culture   Final    CULTURE REINCUBATED FOR BETTER GROWTH Performed at [facility], [address], [place], [place] [address]    Report Status PENDING  Incomplete  Blood Culture ID Panel (Reflexed)     Status: Abnormal   Collection Time: 12/22/23  3:19 AM  Result Value Ref Range Status   Enterococcus faecalis NOT DETECTED NOT DETECTED Final   Enterococcus Faecium NOT DETECTED NOT DETECTED  Final   Listeria monocytogenes NOT DETECTED NOT DETECTED Final   Staphylococcus species DETECTED (A) NOT DETECTED Final    Comment: CRITICAL RESULT CALLED TO, READ BACK BY AND VERIFIED WITH: M CRABALLO RN 12/22/2023 @ 2343 BY AB    Staphylococcus aureus (BCID) NOT DETECTED NOT DETECTED Final   Staphylococcus epidermidis DETECTED (A) NOT DETECTED Final    Comment: Methicillin (oxacillin) resistant coagulase negative staphylococcus. Possible blood culture contaminant (unless isolated from more than one blood culture draw or clinical case suggests pathogenicity). No antibiotic treatment is indicated for blood  culture contaminants. CRITICAL RESULT CALLED TO, READ BACK BY AND VERIFIED WITH: M CRABALLO RN 12/22/2023 @ 2343 BY AB    Staphylococcus lugdunensis NOT DETECTED NOT DETECTED Final   Streptococcus species NOT DETECTED NOT DETECTED Final   Streptococcus agalactiae NOT DETECTED NOT DETECTED Final   Streptococcus pneumoniae NOT DETECTED NOT DETECTED Final   Streptococcus pyogenes NOT DETECTED NOT DETECTED Final   A.calcoaceticus-baumannii NOT DETECTED NOT DETECTED Final   Bacteroides fragilis NOT DETECTED NOT DETECTED Final   Enterobacterales NOT DETECTED NOT DETECTED Final   Enterobacter cloacae complex NOT DETECTED NOT DETECTED Final   Escherichia coli NOT DETECTED NOT DETECTED  Final   Klebsiella aerogenes NOT DETECTED NOT DETECTED Final   Klebsiella oxytoca NOT DETECTED NOT DETECTED Final   Klebsiella pneumoniae NOT DETECTED NOT DETECTED Final   Proteus species NOT DETECTED NOT DETECTED Final   Salmonella species NOT DETECTED NOT DETECTED Final   Serratia marcescens NOT DETECTED NOT DETECTED Final   Haemophilus influenzae NOT DETECTED NOT DETECTED Final   Neisseria meningitidis NOT DETECTED NOT DETECTED Final   Pseudomonas aeruginosa NOT DETECTED NOT DETECTED Final   Stenotrophomonas maltophilia NOT DETECTED NOT DETECTED Final   Candida albicans NOT DETECTED NOT DETECTED Final   Candida auris NOT DETECTED NOT DETECTED Final   Candida glabrata NOT DETECTED NOT DETECTED Final   Candida krusei NOT DETECTED NOT DETECTED Final   Candida parapsilosis NOT DETECTED NOT DETECTED Final   Candida tropicalis NOT DETECTED NOT DETECTED Final   Cryptococcus neoformans/gattii NOT DETECTED NOT DETECTED Final   Methicillin resistance mecA/C DETECTED (A) NOT DETECTED Final    Comment: CRITICAL RESULT CALLED TO, READ BACK BY AND VERIFIED WITH: CHRISTELLA ALIAS RN 12/22/2023 @ 2343 BY AB Performed at [facility], [address] [place], [place] [address]   Culture, blood (Routine X 2) w Reflex to ID Panel     Status: None (Preliminary result)   Collection Time: 12/22/23  6:59 PM   Specimen: BLOOD  Result Value Ref Range Status   Specimen Description BLOOD BLOOD RIGHT ARM  Final   Special Requests AEROBIC BOTTLE ONLY Blood Culture adequate volume  Final   Culture   Final    NO GROWTH < 12 HOURS Performed at [facility] [address], [place], [place] [address]    Report Status PENDING  Incomplete  Culture, blood (Routine X 2) w Reflex to ID Panel     Status: None (Preliminary result)   Collection Time: 12/22/23  6:59 PM   Specimen: BLOOD  Result Value Ref Range Status   Specimen Description BLOOD BLOOD RIGHT ARM  Final   Special Requests AEROBIC BOTTLE ONLY Blood  Culture adequate volume  Final   Culture   Final    NO GROWTH < 12 HOURS Performed at [facility], [address] [place], [place] [address]    Report Status PENDING  Incomplete     Radiology Studies:  CT ANGIO GI BLEED Result Date: 12/22/2023 EXAM: CTA ABDOMEN AND PELVIS WITH CONTRAST 12/22/2023 03:57:41 AM TECHNIQUE: CTA images of the abdomen and pelvis with intravenous contrast. 100 mL iohexol (OMNIPAQUE) 350 MG/ML injection. Three-dimensional MIP/volume rendered formations were performed. Automated exposure control, iterative reconstruction, and/or weight based adjustment of the mA/kV was utilized to reduce the radiation dose to as low as reasonably achievable. COMPARISON: MRI abdomen dated 11/02/2023. CLINICAL HISTORY: Lower GI bleed (Ped 0-17y); hypotension and melena and abdominal pain. FINDINGS: VASCULATURE: GI BLEED: No intraluminal spillage of contrast to suggest active GI bleeding. AORTA: Atherosclerotic calcifications of the abdominal aorta, although patent. No abdominal aortic aneurysm. No dissection. CELIAC TRUNK: No occlusion or significant stenosis. SUPERIOR MESENTERIC ARTERY: No occlusion or significant stenosis. INFERIOR MESENTERIC ARTERY: No occlusion or significant stenosis. RENAL ARTERIES: No occlusion or significant stenosis. ILIAC ARTERIES: No occlusion or significant stenosis. ABDOMEN/PELVIS: LOWER CHEST: Mild eventration of the right hemidiaphragm. LIVER: Hepatic cysts measuring up to 2.8 cm, benign. GALLBLADDER AND BILE DUCTS: Gallbladder is unremarkable. No biliary ductal dilatation. SPLEEN: The spleen is unremarkable. PANCREAS: Small pancreatic cysts measuring up to 7 mm, likely benign. Given the patient's age, no follow-up is recommended. ADRENAL GLANDS: Bilateral adrenal glands demonstrate no acute abnormality. KIDNEYS, URETERS AND BLADDER: 17 mm simple left renal cyst, benign. No follow-up is recommended. No stones in the kidneys or ureters. No hydronephrosis. No perinephric  or periureteral stranding. Mild bladder wall thickening (image 205), correlate for cystitis. GI AND BOWEL: Stomach and duodenal sweep demonstrate no acute abnormality. The colon is decompressed. There is no bowel obstruction. No abnormal bowel wall thickening or distension. REPRODUCTIVE: Status post hysterectomy. PERITONEUM AND RETROPERITONEUM: No ascites or free air. LYMPH NODES: No lymphadenopathy. BONES AND SOFT TISSUES: No acute abnormality of the bones. No acute soft tissue abnormality. IMPRESSION: 1. No active GI bleeding. 2. Mild bladder wall thickening, equivocal. Correlate for cystitis. Electronically signed by: Pinkie Pebbles MD 12/22/2023 04:04 AM EST RP Workstation: [id number]   DG Chest Port 1 View Result Date: 12/22/2023 EXAM: 1 VIEW(S) XRAY OF THE CHEST 12/22/2023 02:57:00 AM COMPARISON: 08/05/2022 CLINICAL HISTORY: emesis FINDINGS: LUNGS AND PLEURA: No focal pulmonary opacity. No pleural effusion. No pneumothorax. HEART AND MEDIASTINUM: No acute abnormality of the cardiac and mediastinal silhouettes. BONES AND SOFT TISSUES: No acute osseous abnormality. IMPRESSION: 1. No acute cardiopulmonary process. Electronically signed by: Pinkie Pebbles MD 12/22/2023 03:00 AM EST RP Workstation: [id number]    Scheduled Meds:  atorvastatin   20 mg Oral QHS   benazepril   20 mg Oral Daily   calcium  carbonate  1 tablet Oral Q breakfast   levothyroxine   75 mcg Oral Daily   lipase/protease/amylase  72,000 Units Oral TID WC   magnesium oxide  400 mg Oral Daily   pantoprazole (PROTONIX) IV  40 mg Intravenous Q12H   Continuous Infusions:  vancomycin      LOS: 0 days   Time spent: 55 mins  Maryam Feely Vicci, MD How to contact the [facility] Attending or Consulting provider 7A - 7P or covering provider during after hours 7P -7A, for this patient?  Check the care team in [facility] and look for a) attending/consulting TR[facility] provider listed and b) the TRH team listed Log into [email or web address] to find provider on  call.  Locate the TRH provider you are looking for under [facility] and page to a number that you can be directly reached. If you still have difficulty reaching the provider, please page the [facility] (Director on Call) for the Hospitalists listed on  amion for assistance.  12/23/2023, 3:01 PM  ]

## 2023-12-23 NOTE — Progress Notes (Signed)
 Gastroenterology Progress Note   Referring Provider: No ref. provider found Primary Care Physician:  Kennyth Worth [name], MD Primary Gastroenterologist:  Dr. Avram ([facility])  Patient ID: Linda Knight; [id number]; [birth year]    Subjective   Feeling okay this morning, denies any abdominal pain, chest pain, shortness of breath, or lightheadedness/dizziness.  No bowel movement since prior to admission however had diarrhea prior to this.  Had just finished walking a lap on the unit.  Did attempt to eat breakfast however not having much of an appetite currently.  She states that she would have a few liquor containing cocktails a week for a while, denied any NSAIDs or tobacco use.  Only using Tylenol  for pain.  She does indicate that she is going to stop drinking all alcohol completely given her recent findings.  Objective   Vital signs in last 24 hours Temp:  [97.7 F (36.5 C)-98.7 F (37.1 C)] 97.8 F (36.6 C) (11/14 0407) Pulse Rate:  [69-85] 69 (11/14 0407) Resp:  [15-22] 20 (11/14 0407) BP: (116-155)/(54-76) 150/72 (11/14 0407) SpO2:  [95 %-100 %] 100 % (11/14 0407) Weight:  [63.9 kg] 63.9 kg (11/13 1119) Last BM Date : 12/21/23  Physical Exam General:   Alert and oriented, pleasant Head:  Normocephalic and atraumatic. Eyes:  No icterus, sclera clear. Conjuctiva pink.  Mouth:  Without lesions, mucosa pink and moist.  Abdomen:  Bowel sounds present, soft, non-tender, non-distended. No HSM or hernias noted. No rebound or guarding. No masses appreciated  Msk:  Symmetrical without gross deformities. Normal posture. Extremities:  Without clubbing or edema. Neurologic:  Alert and  oriented x4;  grossly normal neurologically. Skin:  Warm and dry, intact without significant lesions.  Psych:  Alert and cooperative. Normal mood and affect.  Intake/Output from previous day: 11/13 0701 - 11/14 0700 In: 416.7 [I.V.:216.7; IV Piggyback:200] Out: -  Intake/Output this shift: No  intake/output data recorded.  Lab Results  Recent Labs    12/22/23 0240 12/22/23 0905 12/23/23 0347  WBC 8.1  --  7.3  HGB 5.2* 12.3 11.5*  HCT 15.7* 34.4* 33.1*  PLT 196  --  165   BMET Recent Labs    12/22/23 0240 12/23/23 0347  NA 135 142  K 3.6 4.2  CL 101 105  CO2 24 29  GLUCOSE 153* 103*  BUN 70* 23  CREATININE 1.02* 0.91  CALCIUM  7.9* 8.9   LFT Recent Labs    12/22/23 0240 12/23/23 0347  PROT 5.3* 6.1*  ALBUMIN 3.5 4.0  AST 19 27  ALT 14 18  ALKPHOS 58 65  BILITOT 0.2 0.5   PT/INR Recent Labs    12/22/23 0319  LABPROT 14.8  INR 1.1   Hepatitis Panel No results for input(s): HEPBSAG, HCVAB, HEPAIGM, HEPBIGM in the last 72 hours.  Studies/Results CT ANGIO GI BLEED Result Date: 12/22/2023 EXAM: CTA ABDOMEN AND PELVIS WITH CONTRAST 12/22/2023 03:57:41 AM TECHNIQUE: CTA images of the abdomen and pelvis with intravenous contrast. 100 mL iohexol (OMNIPAQUE) 350 MG/ML injection. Three-dimensional MIP/volume rendered formations were performed. Automated exposure control, iterative reconstruction, and/or weight based adjustment of the mA/kV was utilized to reduce the radiation dose to as low as reasonably achievable. COMPARISON: MRI abdomen dated 11/02/2023. CLINICAL HISTORY: Lower GI bleed (Ped 0-17y); hypotension and melena and abdominal pain. FINDINGS: VASCULATURE: GI BLEED: No intraluminal spillage of contrast to suggest active GI bleeding. AORTA: Atherosclerotic calcifications of the abdominal aorta, although patent. No abdominal aortic aneurysm. No dissection. CELIAC TRUNK: No  occlusion or significant stenosis. SUPERIOR MESENTERIC ARTERY: No occlusion or significant stenosis. INFERIOR MESENTERIC ARTERY: No occlusion or significant stenosis. RENAL ARTERIES: No occlusion or significant stenosis. ILIAC ARTERIES: No occlusion or significant stenosis. ABDOMEN/PELVIS: LOWER CHEST: Mild eventration of the right hemidiaphragm. LIVER: Hepatic cysts measuring up  to 2.8 cm, benign. GALLBLADDER AND BILE DUCTS: Gallbladder is unremarkable. No biliary ductal dilatation. SPLEEN: The spleen is unremarkable. PANCREAS: Small pancreatic cysts measuring up to 7 mm, likely benign. Given the patient's age, no follow-up is recommended. ADRENAL GLANDS: Bilateral adrenal glands demonstrate no acute abnormality. KIDNEYS, URETERS AND BLADDER: 17 mm simple left renal cyst, benign. No follow-up is recommended. No stones in the kidneys or ureters. No hydronephrosis. No perinephric or periureteral stranding. Mild bladder wall thickening (image 205), correlate for cystitis. GI AND BOWEL: Stomach and duodenal sweep demonstrate no acute abnormality. The colon is decompressed. There is no bowel obstruction. No abnormal bowel wall thickening or distension. REPRODUCTIVE: Status post hysterectomy. PERITONEUM AND RETROPERITONEUM: No ascites or free air. LYMPH NODES: No lymphadenopathy. BONES AND SOFT TISSUES: No acute abnormality of the bones. No acute soft tissue abnormality. IMPRESSION: 1. No active GI bleeding. 2. Mild bladder wall thickening, equivocal. Correlate for cystitis. Electronically signed by: Pinkie Pebbles MD 12/22/2023 04:04 AM EST RP Workstation: [id number]   DG Chest Port 1 View Result Date: 12/22/2023 EXAM: 1 VIEW(S) XRAY OF THE CHEST 12/22/2023 02:57:00 AM COMPARISON: 08/05/2022 CLINICAL HISTORY: emesis FINDINGS: LUNGS AND PLEURA: No focal pulmonary opacity. No pleural effusion. No pneumothorax. HEART AND MEDIASTINUM: No acute abnormality of the cardiac and mediastinal silhouettes. BONES AND SOFT TISSUES: No acute osseous abnormality. IMPRESSION: 1. No acute cardiopulmonary process. Electronically signed by: Pinkie Pebbles MD 12/22/2023 03:00 AM EST RP Workstation: [id number]    Assessment  83 y.o. female with a history of HTN, CAD, HLD, OSA, skin cancer, thyroid  disease, pancreatic cysts, EPI, chronic etoh use, and chronic back pain who presented to the ED via EMS for  LOC and black tarry stools   Acute on chronic anemia: - Presented to the hospital after syncopal episode secondary to upper GI bleed and likely also exacerbated by dehydration given recent nausea/vomiting/diarrhea and melena. - Had recent outpatient orthopedic injection in her knee. - Previously reported some chronic epigastric pain as well as pelvic pain which exacerbated prior to admission.  - chronic EtoH use -cocktail (s) with dinner few times a week - CT GI bleed scan negative for acute bleed - Hgb 5.2 >> 12.3>>11.5 - Iron studies not obtained. Folate wnl and B12 low normal at 255 - BUN initially 70 - now normalized at 23 - EGD 11/13 with normal esophagus, non- bleeding linear gastric ulcers with a clean ulcer base in the incisura and antrum s/p biopsy, non- bleeding gastric ulcer with a nonbleeding visible vessel with MR conditional clip placed, normal duodenal bulb, and non- bleeding duodenal diverticulum. - I discussed and reiterated to her the need for repeat upper endoscopy in 8-12 weeks to assess for healing. - She will be contacted with pathology results as they become available.  Pancreatic cysts: Small pancreatic cysts noted on CTA measuring up to 7 mm and given age no follow up is recommended. Does have chronic EtoH use. Had EUS in 2021 with no dilated main pancreatic duct and no adenopathy with two small innocent appearing pancreatic cysts and an atrophic pancreatic parenchyma.   Plan / Recommendations  PPI BID for 4 weeks then daily Repeat EGD in 8-12 weeks Avoid NSAIDs and EtoH Repeat H/H  in 3-4 weeks with primary GI Hospital follow up in 3-4 weeks with primary GI   LOS: 0 days   12/23/2023, 9:31 AM   Charmaine Melia, MSN, FNP-BC, AGACNP-BC [facility]

## 2023-12-23 NOTE — Anesthesia Postprocedure Evaluation (Signed)
 Anesthesia Post Note  Patient: Linda Knight  Procedure(s) Performed: EGD (ESOPHAGOGASTRODUODENOSCOPY)  Patient location during evaluation: Phase II Anesthesia Type: MAC Level of consciousness: awake Pain management: pain level controlled Vital Signs Assessment: post-procedure vital signs reviewed and stable Respiratory status: spontaneous breathing and respiratory function stable Cardiovascular status: blood pressure returned to baseline and stable Postop Assessment: no headache and no apparent nausea or vomiting Anesthetic complications: no Comments: Late entry   No notable events documented.   Last Vitals:  Vitals:   12/23/23 0407 12/23/23 1319  BP: (!) 150/72 (!) 152/75  Pulse: 69 70  Resp: 20 20  Temp: 36.6 C 36.6 C  SpO2: 100% 98%    Last Pain:  Vitals:   12/23/23 1319  TempSrc: Oral  PainSc:                  [name]

## 2023-12-23 NOTE — Progress Notes (Signed)
 Pharmacy Antibiotic Note  Linda Knight is a 83 y.o. female admitted on 12/22/2023 with GI Bleed.  Pharmacy has been consulted for Vancomycin dosing for positive blood cultures with MRSE. Unclear if this represents contamination vs true infection.   Plan: Vancomycin 750 mg IV q24h >>>Estimated AUC: 460 Trend WBC, temp, renal function  F/U infectious work-up Drug levels as indicated   Height: 5' 4 (162.6 cm) Weight: 63.9 kg (140 lb 14 oz) IBW/kg (Calculated) : 54.7  Temp (24hrs), Avg:98.1 F (36.7 C), Min:97.6 F (36.4 C), Max:98.7 F (37.1 C)  Recent Labs  Lab 12/22/23 0240  WBC 8.1  CREATININE 1.02*    Estimated Creatinine Clearance: 37.4 mL/min (A) (by C-G formula based on SCr of 1.02 mg/dL (H)).    Allergies  Allergen Reactions   Tape Rash   Lynwood Mckusick, PharmD, BCPS Clinical Pharmacist Phone: [phone]

## 2023-12-24 DIAGNOSIS — K862 Cyst of pancreas: Secondary | ICD-10-CM | POA: Diagnosis not present

## 2023-12-24 DIAGNOSIS — K8689 Other specified diseases of pancreas: Secondary | ICD-10-CM

## 2023-12-24 DIAGNOSIS — K922 Gastrointestinal hemorrhage, unspecified: Secondary | ICD-10-CM | POA: Diagnosis not present

## 2023-12-24 DIAGNOSIS — K279 Peptic ulcer, site unspecified, unspecified as acute or chronic, without hemorrhage or perforation: Secondary | ICD-10-CM

## 2023-12-24 MED ORDER — PANTOPRAZOLE SODIUM 40 MG PO TBEC: DELAYED_RELEASE_TABLET | ORAL | 1 refills | Status: DC

## 2023-12-24 MED ORDER — PANTOPRAZOLE SODIUM 40 MG PO TBEC: 40.0000 mg | DELAYED_RELEASE_TABLET | Freq: Two times a day (BID) | ORAL | Status: DC

## 2023-12-24 NOTE — Discharge Instructions (Signed)
 PLEASE  HAVE CBC CHECKED IN 3-4 WEEKS WHEN YOU FOLLOW UP WITH DR [facility] PLEASE FOLLOW UP WITH [facility] DR AVRAM IN 3-4 WEEKS    IMPORTANT INFORMATION: PAY CLOSE ATTENTION   PHYSICIAN DISCHARGE INSTRUCTIONS  Follow with Primary care provider  Kenny[place] Worth [name], MD  and other consultants as instructed by your Hospitalist Physician  SEEK MEDICAL CARE OR RETURN TO EMERGENCY ROOM IF SYMPTOMS COME BACK, WORSEN OR NEW PROBLEM DEVELOPS   Please note: You were cared for by a hospitalist during your hospital stay. Every effort will be made to forward records to your primary care provider.  You can request that your primary care provider send for your hospital records if they have not received them.  Once you are discharged, your primary care physician will handle any further medical issues. Please note that NO REFILLS for any discharge medications will be authorized once you are discharged, as it is imperative that you return to your primary care physician (or establish a relationship with a primary care physician if you do not have one) for your post hospital discharge needs so that they can reassess your need for medications and monitor your lab values.  Please get a complete blood count and chemistry panel checked by your Primary MD at your next visit, and again as instructed by your Primary MD.  Get Medicines reviewed and adjusted: Please take all your medications with you for your next visit with your Primary MD  Laboratory/radiological data: Please request your Primary MD to go over all hospital tests and procedure/radiological results at the follow up, please ask your primary care provider to get all Hospital records sent to his/her office.  In some cases, they will be blood work, cultures and biopsy results pending at the time of your discharge. Please request that your primary care provider follow up on these results.  If you are diabetic, please bring your blood sugar readings  with you to your follow up appointment with primary care.    Please call and make your follow up appointments as soon as possible.    Also Note the following: If you experience worsening of your admission symptoms, develop shortness of breath, life threatening emergency, suicidal or homicidal thoughts you must seek medical attention immediately by calling 911 or calling your MD immediately  if symptoms less severe.  You must read complete instructions/literature along with all the possible adverse reactions/side effects for all the Medicines you take and that have been prescribed to you. Take any new Medicines after you have completely understood and accpet all the possible adverse reactions/side effects.   Do not drive when taking Pain medications or sleeping medications (Benzodiazepines)  Do not take more than prescribed Pain, Sleep and Anxiety Medications. It is not advisable to combine anxiety,sleep and pain medications without talking with your primary care practitioner  Special Instructions: If you have smoked or chewed Tobacco  in the last 2 yrs please stop smoking, stop any regular Alcohol  and or any Recreational drug use.  Wear Seat belts while driving.  Do not drive if taking any narcotic, mind altering or controlled substances or recreational drugs or alcohol.

## 2023-12-24 NOTE — Discharge Summary (Signed)
 Physician Discharge Summary  Linda Knight FMW:[id number] DOB: April 11, 1940 DOA: 12/22/2023  PCP: Linda Worth [name], MD GI: Dr. Avram [facility]  Admit date: 12/22/2023 Discharge date: 12/24/2023  Admitted From:  Home  Disposition:  Home   Recommendations for Outpatient Follow-up:  Follow up with PCP in 1 weeks Follow up with [facility] Dr. Avram in 3-4 weeks Please obtain BMP/CBC in 3-4 weeks when follow up with GI Linda Knight [facility] recommended repeat EGD in 8-12 weeks Avoid all NSAIDs medications and alcohol   Home Health: NA   Discharge Condition: STABLE   CODE STATUS: FULL DIET:  soft foods, advance diet as tolerated    Brief Hospitalization Summary: Please see all hospital notes, images, labs for full details of the hospitalization. Admission provider HPI:  83 y.o. female with medical history significant of hyperlipidemia, pancreatic cyst, CAD, hypothyroidism, chronic back pain who presents to the emergency department from home via EMS due to loss of consciousness.  Patient states that she received 2 injections (60 mg of Toradol and 80 mg of Depo-Medrol  IM) from orthopedic surgeon's office due to low back pain on Monday ((11/10).  On Tuesday (11/11) patient states that she felt sick by having several episodes of nausea and vomiting as well as diarrhea with difficulty in being able to tolerate any oral intake due to vomiting.  Yesterday (11/12), patient states that she must have passed out while in the restroom, since husband found her with decreased responsiveness next to the toilet and she had a lot of black stool.  She complained of generalized weakness, but denies chest pain, shortness of breath, fever, chills.    HOSPITAL COURSE BY PROBLEM LIST  Acute on chronic anemia -- s/p EGD 11/13 with Linda Knight with findings of normal esophagus, nonbleeding linear gastric ulcers with a clean ulcer base in the incisura and antrum status postbiopsy, nonbleeding gastric ulcer with a  nonbleeding visible vessel with MR conditional clip placed, normal duodenal bulb, and nonbleeding duodenal diverticulum -- GI team recommending repeat upper endoscopy in 8 to 12 weeks -- Pathology pending -- She is now on PPI twice daily for 4 weeks then once daily afterwards -- she is advised to avoid NSAIDs per GI -- GI recommending H/H in 4 weeks with her primary GI and follow up with her primary GI in 3-4 weeks   Dehydration IV hydration was provided and she is much improved   Positive blood culture  (Skin Contaminant) -- micro lab reporting MRSE  -- blood culture was repeated and so far 2 days culture reporting no growth -- this likely was a skin contaminant as pt has no symptoms and reassuring vitals and labs -- she was started on IV vancomycin but this has been discontinued  -- so far, no growth from repeat cultures after 2 days -- repeat BCs were collected before any antibiotics given    Nausea and vomiting Continue Zofran  as needed   Chronic low back pain Patient has lumbar spondylosis with left leg radiculopathy, left hip mild osteoarthritis. She received 60 mg of Toradol and 80 mg of Depo-Medrol  IM at orthopedics office on Monday 11/12   Elevated MCV MCV 100.6, folate and vitamin B12 levels will be checked   Mixed hyperlipidemia/CAD Continue statin   Acquired hypothyroidism Continue Synthroid    Pancreatic cyst Continue Creon  Per GI team, given age no follow up recommended Pt to follow up with her primary GI Dr. Avram [facility]   Discharge Diagnoses:  Principal Problem:  GI bleed Active Problems:   Upper GI bleed   Discharge Instructions: Discharge Instructions     Ambulatory referral to Gastroenterology   Complete by: As directed    Hospital follow up 3-4 weeks   What is the reason for referral?: Other      Allergies as of 12/24/2023       Reactions   Tape Rash        Medication List     STOP taking these medications    aspirin EC 81  MG tablet   predniSONE  5 MG (21) Tbpk tablet Commonly known as: STERAPRED UNI-PAK 21 TAB       TAKE these medications    b complex vitamins capsule Take 1 capsule by mouth daily.   benazepril  20 MG tablet Commonly known as: LOTENSIN  Take 1 tablet (20 mg total) by mouth daily.   conjugated estrogens  vaginal cream Commonly known as: PREMARIN  Place 1 Applicatorful vaginally once a week.   hydrochlorothiazide  25 MG tablet Commonly known as: HYDRODIURIL  TAKE 1/2 TABLET BY MOUTH EVERY DAY   Klor-Con  M20 20 MEQ tablet Generic drug: potassium chloride  SA TAKE 1 TABLET BY MOUTH TWICE A DAY   levothyroxine  75 MCG tablet Commonly known as: SYNTHROID  Take 1 tablet (75 mcg total) by mouth daily.   lipase/protease/amylase 63999 UNITS Cpep capsule Commonly known as: Creon  Take 2 capsules (72,000 Units total) by mouth 3 (three) times daily with meals. May also take 1 capsule (36,000 Units total) as needed (with snacks - up to 4 snacks daily).   magnesium oxide 400 (240 Mg) MG tablet Commonly known as: MAG-OX Take 400 mg by mouth daily.   pantoprazole 40 MG tablet Commonly known as: PROTONIX 1 po BID x 4 weeks, then once daily   Vitamin D3 50 MCG (2000 UT) capsule Take 2,000 Units by mouth daily.   zolpidem  12.5 MG CR tablet Commonly known as: AMBIEN  CR TAKE 1 TABLET(12.5 MG) BY MOUTH AT BEDTIME AS NEEDED FOR SLEEP What changed: See the new instructions.        Follow-up Information     Linda Lupita BRAVO, MD. Schedule an appointment as soon as possible for a visit in 3 week(s).   Specialty: Gastroenterology Why: Hospital Follow Up Contact information: [address] [place] [address] [phone]         Linda Worth [name], MD. Schedule an appointment as soon as possible for a visit in 1 week(s).   Specialty: Family Medicine Why: Hospital Follow Up Contact information: [address] [place] [address] [phone]                Allergies   Allergen Reactions   Tape Rash   Allergies as of 12/24/2023       Reactions   Tape Rash        Medication List     STOP taking these medications    aspirin EC 81 MG tablet   predniSONE  5 MG (21) Tbpk tablet Commonly known as: STERAPRED UNI-PAK 21 TAB       TAKE these medications    b complex vitamins capsule Take 1 capsule by mouth daily.   benazepril  20 MG tablet Commonly known as: LOTENSIN  Take 1 tablet (20 mg total) by mouth daily.   conjugated estrogens  vaginal cream Commonly known as: PREMARIN  Place 1 Applicatorful vaginally once a week.   hydrochlorothiazide  25 MG tablet Commonly known as: HYDRODIURIL  TAKE 1/2 TABLET BY MOUTH EVERY DAY   Klor-Con  M20 20 MEQ  tablet Generic drug: potassium chloride  SA TAKE 1 TABLET BY MOUTH TWICE A DAY   levothyroxine  75 MCG tablet Commonly known as: SYNTHROID  Take 1 tablet (75 mcg total) by mouth daily.   lipase/protease/amylase 63999 UNITS Cpep capsule Commonly known as: Creon  Take 2 capsules (72,000 Units total) by mouth 3 (three) times daily with meals. May also take 1 capsule (36,000 Units total) as needed (with snacks - up to 4 snacks daily).   magnesium oxide 400 (240 Mg) MG tablet Commonly known as: MAG-OX Take 400 mg by mouth daily.   pantoprazole 40 MG tablet Commonly known as: PROTONIX 1 po BID x 4 weeks, then once daily   Vitamin D3 50 MCG (2000 UT) capsule Take 2,000 Units by mouth daily.   zolpidem  12.5 MG CR tablet Commonly known as: AMBIEN  CR TAKE 1 TABLET(12.5 MG) BY MOUTH AT BEDTIME AS NEEDED FOR SLEEP What changed: See the new instructions.        Procedures/Studies: CT ANGIO GI BLEED Result Date: 12/22/2023 EXAM: CTA ABDOMEN AND PELVIS WITH CONTRAST 12/22/2023 03:57:41 AM TECHNIQUE: CTA images of the abdomen and pelvis with intravenous contrast. 100 mL iohexol (OMNIPAQUE) 350 MG/ML injection. Three-dimensional MIP/volume rendered formations were performed. Automated exposure  control, iterative reconstruction, and/or weight based adjustment of the mA/kV was utilized to reduce the radiation dose to as low as reasonably achievable. COMPARISON: MRI abdomen dated 11/02/2023. CLINICAL HISTORY: Lower GI bleed (Ped 0-17y); hypotension and melena and abdominal pain. FINDINGS: VASCULATURE: GI BLEED: No intraluminal spillage of contrast to suggest active GI bleeding. AORTA: Atherosclerotic calcifications of the abdominal aorta, although patent. No abdominal aortic aneurysm. No dissection. CELIAC TRUNK: No occlusion or significant stenosis. SUPERIOR MESENTERIC ARTERY: No occlusion or significant stenosis. INFERIOR MESENTERIC ARTERY: No occlusion or significant stenosis. RENAL ARTERIES: No occlusion or significant stenosis. ILIAC ARTERIES: No occlusion or significant stenosis. ABDOMEN/PELVIS: LOWER CHEST: Mild eventration of the right hemidiaphragm. LIVER: Hepatic cysts measuring up to 2.8 cm, benign. GALLBLADDER AND BILE DUCTS: Gallbladder is unremarkable. No biliary ductal dilatation. SPLEEN: The spleen is unremarkable. PANCREAS: Small pancreatic cysts measuring up to 7 mm, likely benign. Given the patient's age, no follow-up is recommended. ADRENAL GLANDS: Bilateral adrenal glands demonstrate no acute abnormality. KIDNEYS, URETERS AND BLADDER: 17 mm simple left renal cyst, benign. No follow-up is recommended. No stones in the kidneys or ureters. No hydronephrosis. No perinephric or periureteral stranding. Mild bladder wall thickening (image 205), correlate for cystitis. GI AND BOWEL: Stomach and duodenal sweep demonstrate no acute abnormality. The colon is decompressed. There is no bowel obstruction. No abnormal bowel wall thickening or distension. REPRODUCTIVE: Status post hysterectomy. PERITONEUM AND RETROPERITONEUM: No ascites or free air. LYMPH NODES: No lymphadenopathy. BONES AND SOFT TISSUES: No acute abnormality of the bones. No acute soft tissue abnormality. IMPRESSION: 1. No active GI  bleeding. 2. Mild bladder wall thickening, equivocal. Correlate for cystitis. Electronically signed by: Pinkie Pebbles MD 12/22/2023 04:04 AM EST RP Workstation: [id number]   DG Chest Port 1 View Result Date: 12/22/2023 EXAM: 1 VIEW(S) XRAY OF THE CHEST 12/22/2023 02:57:00 AM COMPARISON: 08/05/2022 CLINICAL HISTORY: emesis FINDINGS: LUNGS AND PLEURA: No focal pulmonary opacity. No pleural effusion. No pneumothorax. HEART AND MEDIASTINUM: No acute abnormality of the cardiac and mediastinal silhouettes. BONES AND SOFT TISSUES: No acute osseous abnormality. IMPRESSION: 1. No acute cardiopulmonary process. Electronically signed by: Pinkie Pebbles MD 12/22/2023 03:00 AM EST RP Workstation: [id number]     Subjective: Pt says she wants to go home, says no fever or chills, no  rigors, tolerating diet well, no complaints.   Discharge Exam: Vitals:   12/23/23 1900 12/24/23 0408  BP: (!) 163/67 (!) 150/75  Pulse: 67 69  Resp: 18 18  Temp: 97.8 F (36.6 C) 98.1 F (36.7 C)  SpO2: 94% 97%   Vitals:   12/23/23 0407 12/23/23 1319 12/23/23 1900 12/24/23 0408  BP: (!) 150/72 (!) 152/75 (!) 163/67 (!) 150/75  Pulse: 69 70 67 69  Resp: 20 20 18 18   Temp: 97.8 F (36.6 C) 97.8 F (36.6 C) 97.8 F (36.6 C) 98.1 F (36.7 C)  TempSrc: Oral Oral Oral Oral  SpO2: 100% 98% 94% 97%  Weight:      Height:       General: Pt is alert, awake, not in acute distress Cardiovascular: normal S1/S2 +, no murmurs, no rubs, no gallops Respiratory: CTA bilaterally, no wheezing, no rhonchi Abdominal: Soft, NT, ND, bowel sounds + Extremities: no edema, no cyanosis   The results of significant diagnostics from this hospitalization (including imaging, microbiology, ancillary and laboratory) are listed below for reference.     Microbiology: Recent Results (from the past 240 hours)  Culture, blood (Routine X 2) w Reflex to ID Panel     Status: None (Preliminary result)   Collection Time: 12/22/23  3:19 AM    Specimen: BLOOD  Result Value Ref Range Status   Specimen Description BLOOD BLOOD RIGHT HAND  Final   Special Requests   Final    BOTTLES DRAWN AEROBIC AND ANAEROBIC Blood Culture results may not be optimal due to an inadequate volume of blood received in culture bottles   Culture   Final    NO GROWTH 2 DAYS Performed at [facility], [address] [place], [place] [address]    Report Status PENDING  Incomplete  Culture, blood (Routine X 2) w Reflex to ID Panel     Status: None (Preliminary result)   Collection Time: 12/22/23  3:19 AM   Specimen: BLOOD  Result Value Ref Range Status   Specimen Description   Final    BLOOD BLOOD LEFT HAND Performed at [facility], [address] [place], [place] [address]    Special Requests   Final    BOTTLES DRAWN AEROBIC AND ANAEROBIC Blood Culture results may not be optimal due to an inadequate volume of blood received in culture bottles Performed at [facility], [address], [place], [place] [address]    Culture  Setup Time   Final    IN BOTH AEROBIC AND ANAEROBIC BOTTLES GRAM POSITIVE COCCI Gram Stain Report Called to,Read Back By and Verified With: CATHERINE FULCHER @1838  ON 12/22/23 C VARNER CRITICAL RESULT CALLED TO, READ BACK BY AND VERIFIED WITH: M CRABALLO RN 12/22/2023 @ 2343 BY AB    Culture   Final    CULTURE REINCUBATED FOR BETTER GROWTH Performed at [facility], [address] [place], [place] [address]    Report Status PENDING  Incomplete  Blood Culture ID Panel (Reflexed)     Status: Abnormal   Collection Time: 12/22/23  3:19 AM  Result Value Ref Range Status   Enterococcus faecalis NOT DETECTED NOT DETECTED Final   Enterococcus Faecium NOT DETECTED NOT DETECTED Final   Listeria monocytogenes NOT DETECTED NOT DETECTED Final   Staphylococcus species DETECTED (A) NOT DETECTED Final    Comment: CRITICAL RESULT CALLED TO, READ BACK BY AND VERIFIED WITH: M CRABALLO RN 12/22/2023 @ 2343 BY AB    Staphylococcus  aureus (BCID) NOT  DETECTED NOT DETECTED Final   Staphylococcus epidermidis DETECTED (A) NOT DETECTED Final    Comment: Methicillin (oxacillin) resistant coagulase negative staphylococcus. Possible blood culture contaminant (unless isolated from more than one blood culture draw or clinical case suggests pathogenicity). No antibiotic treatment is indicated for blood  culture contaminants. CRITICAL RESULT CALLED TO, READ BACK BY AND VERIFIED WITH: M CRABALLO RN 12/22/2023 @ 2343 BY AB    Staphylococcus lugdunensis NOT DETECTED NOT DETECTED Final   Streptococcus species NOT DETECTED NOT DETECTED Final   Streptococcus agalactiae NOT DETECTED NOT DETECTED Final   Streptococcus pneumoniae NOT DETECTED NOT DETECTED Final   Streptococcus pyogenes NOT DETECTED NOT DETECTED Final   A.calcoaceticus-baumannii NOT DETECTED NOT DETECTED Final   Bacteroides fragilis NOT DETECTED NOT DETECTED Final   Enterobacterales NOT DETECTED NOT DETECTED Final   Enterobacter cloacae complex NOT DETECTED NOT DETECTED Final   Escherichia coli NOT DETECTED NOT DETECTED Final   Klebsiella aerogenes NOT DETECTED NOT DETECTED Final   Klebsiella oxytoca NOT DETECTED NOT DETECTED Final   Klebsiella pneumoniae NOT DETECTED NOT DETECTED Final   Proteus species NOT DETECTED NOT DETECTED Final   Salmonella species NOT DETECTED NOT DETECTED Final   Serratia marcescens NOT DETECTED NOT DETECTED Final   Haemophilus influenzae NOT DETECTED NOT DETECTED Final   Neisseria meningitidis NOT DETECTED NOT DETECTED Final   Pseudomonas aeruginosa NOT DETECTED NOT DETECTED Final   Stenotrophomonas maltophilia NOT DETECTED NOT DETECTED Final   Candida albicans NOT DETECTED NOT DETECTED Final   Candida auris NOT DETECTED NOT DETECTED Final   Candida glabrata NOT DETECTED NOT DETECTED Final   Candida krusei NOT DETECTED NOT DETECTED Final   Candida parapsilosis NOT DETECTED NOT DETECTED Final   Candida tropicalis NOT DETECTED NOT DETECTED  Final   Cryptococcus neoformans/gattii NOT DETECTED NOT DETECTED Final   Methicillin resistance mecA/C DETECTED (A) NOT DETECTED Final    Comment: CRITICAL RESULT CALLED TO, READ BACK BY AND VERIFIED WITH: CHRISTELLA ALIAS RN 12/22/2023 @ 2343 BY AB Performed at [facility], [address] [place], [place] [address]   Culture, blood (Routine X 2) w Reflex to ID Panel     Status: None (Preliminary result)   Collection Time: 12/22/23  6:59 PM   Specimen: BLOOD  Result Value Ref Range Status   Specimen Description BLOOD BLOOD RIGHT ARM  Final   Special Requests AEROBIC BOTTLE ONLY Blood Culture adequate volume  Final   Culture   Final    NO GROWTH 2 DAYS Performed at [facility], [address], [place], [place] [address]    Report Status PENDING  Incomplete  Culture, blood (Routine X 2) w Reflex to ID Panel     Status: None (Preliminary result)   Collection Time: 12/22/23  6:59 PM   Specimen: BLOOD  Result Value Ref Range Status   Specimen Description BLOOD BLOOD RIGHT ARM  Final   Special Requests AEROBIC BOTTLE ONLY Blood Culture adequate volume  Final   Culture   Final    NO GROWTH 2 DAYS Performed at [facility], [address], [place], [place] [address]    Report Status PENDING  Incomplete     Labs: BNP (last 3 results) No results for input(s): BNP in the last 8760 hours. Basic Metabolic Panel: Recent Labs  Lab 12/22/23 0240 12/23/23 0347  NA 135 142  K 3.6 4.2  CL 101 105  CO2 24 29  GLUCOSE 153* 103*  BUN 70* 23  CREATININE 1.02*  0.91  CALCIUM  7.9* 8.9  MG 1.8  --   PHOS 2.4*  --    Liver Function Tests: Recent Labs  Lab 12/22/23 0240 12/23/23 0347  AST 19 27  ALT 14 18  ALKPHOS 58 65  BILITOT 0.2 0.5  PROT 5.3* 6.1*  ALBUMIN 3.5 4.0   Recent Labs  Lab 12/22/23 0240  LIPASE 55*   No results for input(s): AMMONIA in the last 168 hours. CBC: Recent Labs  Lab 12/22/23 0240 12/22/23 0905 12/23/23 0347  WBC 8.1  --  7.3  HGB 5.2* 12.3  11.5*  HCT 15.7* 34.4* 33.1*  MCV 100.6*  --  91.7  PLT 196  --  165   Cardiac Enzymes: No results for input(s): CKTOTAL, CKMB, CKMBINDEX, TROPONINI in the last 168 hours. BNP: Invalid input(s): POCBNP CBG: No results for input(s): GLUCAP in the last 168 hours. D-Dimer No results for input(s): DDIMER in the last 72 hours. Hgb A1c No results for input(s): HGBA1C in the last 72 hours. Lipid Profile No results for input(s): CHOL, HDL, LDLCALC, TRIG, CHOLHDL, LDLDIRECT in the last 72 hours. Thyroid  function studies Recent Labs    12/22/23 0240  TSH 1.560   Anemia work up Recent Labs    12/22/23 0803  VITAMINB12 255  FOLATE 6.0   Urinalysis    Component Value Date/Time   COLORURINE STRAW (A) 12/22/2023 0712   APPEARANCEUR CLEAR 12/22/2023 0712   LABSPEC 1.027 12/22/2023 0712   PHURINE 6.0 12/22/2023 0712   GLUCOSEU NEGATIVE 12/22/2023 0712   HGBUR MODERATE (A) 12/22/2023 0712   BILIRUBINUR NEGATIVE 12/22/2023 0712   BILIRUBINUR negative 08/22/2023 1034   KETONESUR NEGATIVE 12/22/2023 0712   PROTEINUR NEGATIVE 12/22/2023 0712   UROBILINOGEN 0.2 08/22/2023 1034   NITRITE NEGATIVE 12/22/2023 0712   LEUKOCYTESUR NEGATIVE 12/22/2023 0712   Sepsis Labs Recent Labs  Lab 12/22/23 0240 12/23/23 0347  WBC 8.1 7.3   Microbiology Recent Results (from the past 240 hours)  Culture, blood (Routine X 2) w Reflex to ID Panel     Status: None (Preliminary result)   Collection Time: 12/22/23  3:19 AM   Specimen: BLOOD  Result Value Ref Range Status   Specimen Description BLOOD BLOOD RIGHT HAND  Final   Special Requests   Final    BOTTLES DRAWN AEROBIC AND ANAEROBIC Blood Culture results may not be optimal due to an inadequate volume of blood received in culture bottles   Culture   Final    NO GROWTH 2 DAYS Performed at [facility], [address], [place], [place] [address]    Report Status PENDING  Incomplete  Culture, blood (Routine X 2) w  Reflex to ID Panel     Status: None (Preliminary result)   Collection Time: 12/22/23  3:19 AM   Specimen: BLOOD  Result Value Ref Range Status   Specimen Description   Final    BLOOD BLOOD LEFT HAND Performed at [facility], [address] [place], [place] [address]    Special Requests   Final    BOTTLES DRAWN AEROBIC AND ANAEROBIC Blood Culture results may not be optimal due to an inadequate volume of blood received in culture bottles Performed at [facility], [address], [place], [place] [address]    Culture  Setup Time   Final    IN BOTH AEROBIC AND ANAEROBIC BOTTLES GRAM POSITIVE COCCI Gram Stain Report Called to,Read Back By and Verified With: CATHERINE FULCHER @1838  ON 12/22/23 C VARNER CRITICAL RESULT CALLED  TO, READ BACK BY AND VERIFIED WITH: M CRABALLO RN 12/22/2023 @ 2343 BY AB    Culture   Final    CULTURE REINCUBATED FOR BETTER GROWTH Performed at [facility], [address] [place], [place] [address]    Report Status PENDING  Incomplete  Blood Culture ID Panel (Reflexed)     Status: Abnormal   Collection Time: 12/22/23  3:19 AM  Result Value Ref Range Status   Enterococcus faecalis NOT DETECTED NOT DETECTED Final   Enterococcus Faecium NOT DETECTED NOT DETECTED Final   Listeria monocytogenes NOT DETECTED NOT DETECTED Final   Staphylococcus species DETECTED (A) NOT DETECTED Final    Comment: CRITICAL RESULT CALLED TO, READ BACK BY AND VERIFIED WITH: M CRABALLO RN 12/22/2023 @ 2343 BY AB    Staphylococcus aureus (BCID) NOT DETECTED NOT DETECTED Final   Staphylococcus epidermidis DETECTED (A) NOT DETECTED Final    Comment: Methicillin (oxacillin) resistant coagulase negative staphylococcus. Possible blood culture contaminant (unless isolated from more than one blood culture draw or clinical case suggests pathogenicity). No antibiotic treatment is indicated for blood  culture contaminants. CRITICAL RESULT CALLED TO, READ BACK BY AND VERIFIED WITH: M  CRABALLO RN 12/22/2023 @ 2343 BY AB    Staphylococcus lugdunensis NOT DETECTED NOT DETECTED Final   Streptococcus species NOT DETECTED NOT DETECTED Final   Streptococcus agalactiae NOT DETECTED NOT DETECTED Final   Streptococcus pneumoniae NOT DETECTED NOT DETECTED Final   Streptococcus pyogenes NOT DETECTED NOT DETECTED Final   A.calcoaceticus-baumannii NOT DETECTED NOT DETECTED Final   Bacteroides fragilis NOT DETECTED NOT DETECTED Final   Enterobacterales NOT DETECTED NOT DETECTED Final   Enterobacter cloacae complex NOT DETECTED NOT DETECTED Final   Escherichia coli NOT DETECTED NOT DETECTED Final   Klebsiella aerogenes NOT DETECTED NOT DETECTED Final   Klebsiella oxytoca NOT DETECTED NOT DETECTED Final   Klebsiella pneumoniae NOT DETECTED NOT DETECTED Final   Proteus species NOT DETECTED NOT DETECTED Final   Salmonella species NOT DETECTED NOT DETECTED Final   Serratia marcescens NOT DETECTED NOT DETECTED Final   Haemophilus influenzae NOT DETECTED NOT DETECTED Final   Neisseria meningitidis NOT DETECTED NOT DETECTED Final   Pseudomonas aeruginosa NOT DETECTED NOT DETECTED Final   Stenotrophomonas maltophilia NOT DETECTED NOT DETECTED Final   Candida albicans NOT DETECTED NOT DETECTED Final   Candida auris NOT DETECTED NOT DETECTED Final   Candida glabrata NOT DETECTED NOT DETECTED Final   Candida krusei NOT DETECTED NOT DETECTED Final   Candida parapsilosis NOT DETECTED NOT DETECTED Final   Candida tropicalis NOT DETECTED NOT DETECTED Final   Cryptococcus neoformans/gattii NOT DETECTED NOT DETECTED Final   Methicillin resistance mecA/C DETECTED (A) NOT DETECTED Final    Comment: CRITICAL RESULT CALLED TO, READ BACK BY AND VERIFIED WITH: CHRISTELLA ALIAS RN 12/22/2023 @ 2343 BY AB Performed at [facility], [address] [place], [place] [address]   Culture, blood (Routine X 2) w Reflex to ID Panel     Status: None (Preliminary result)   Collection Time: 12/22/23  6:59 PM    Specimen: BLOOD  Result Value Ref Range Status   Specimen Description BLOOD BLOOD RIGHT ARM  Final   Special Requests AEROBIC BOTTLE ONLY Blood Culture adequate volume  Final   Culture   Final    NO GROWTH 2 DAYS Performed at [facility], [address], [place], [place] [address]    Report Status PENDING  Incomplete  Culture, blood (Routine X 2)  w Reflex to ID Panel     Status: None (Preliminary result)   Collection Time: 12/22/23  6:59 PM   Specimen: BLOOD  Result Value Ref Range Status   Specimen Description BLOOD BLOOD RIGHT ARM  Final   Special Requests AEROBIC BOTTLE ONLY Blood Culture adequate volume  Final   Culture   Final    NO GROWTH 2 DAYS Performed at [facility], [address] [address] [place], [place] [address]    Report Status PENDING  Incomplete   Time coordinating discharge: 34 mins  SIGNED:  Afton Louder, MD  [facility] 12/24/2023, 9:58 AM How to contact the [facility] Attending or Consulting provider 7A - 7P or covering provider during after hours 7P -7A, for this patient?  Check the care team in [facility] and look for a) attending/consulting TR[facility] provider listed and b) the TRH team listed Log into [email or web address] and use [place]'s universal password to access. If you do not have the password, please contact the hospital operator. Locate the [facility] provider you are looking for under [facility] and page to a number that you can be directly reached. If you still have difficulty reaching the provider, please page the [facility] (Director on Call) for the Hospitalists listed on amion for assistance.

## 2023-12-26 ENCOUNTER — Telehealth: Payer: Self-pay | Admitting: *Deleted

## 2023-12-26 ENCOUNTER — Ambulatory Visit (INDEPENDENT_AMBULATORY_CARE_PROVIDER_SITE_OTHER): Payer: Self-pay | Admitting: Gastroenterology

## 2023-12-26 ENCOUNTER — Telehealth: Payer: Self-pay

## 2023-12-26 LAB — TYPE AND SCREEN
ABO/RH(D): O POS
Antibody Screen: NEGATIVE
Unit division: 0
Unit division: 0
Unit division: 0
Unit division: 0

## 2023-12-26 LAB — BPAM RBC
Blood Product Expiration Date: 202512102359
Blood Product Expiration Date: 202512102359
Blood Product Expiration Date: 202512102359
Blood Product Expiration Date: 202512122359
ISSUE DATE / TIME: 202511130325
ISSUE DATE / TIME: 202511130438
ISSUE DATE / TIME: 202511130452
Unit Type and Rh: 5100
Unit Type and Rh: 5100
Unit Type and Rh: 5100
Unit Type and Rh: 9500

## 2023-12-26 LAB — CULTURE, BLOOD (ROUTINE X 2)

## 2023-12-26 NOTE — Telephone Encounter (Signed)
 Copied from CRM #[id number]. Topic: Appointments - Appointment Scheduling >> Dec 26, 2023  8:35 AM Rosina BIRCH wrote: Patient/patient representative is calling to schedule an appointment. Refer to attachments for appointment information.   Patient called stating she want the provider to look at her records from when she was at [facility] for a bleeding ulcer   Appt schedule for 12/11/2023 [facility]

## 2023-12-26 NOTE — Transitions of Care (Post Inpatient/ED Visit) (Signed)
 12/26/2023  Name: Linda Knight MRN: [id number] DOB: Apr 23, 1940  Today's TOC FU Call Status: Today's TOC FU Call Status:: Successful TOC FU Call Completed TOC FU Call Complete Date: 12/26/23  Patient's Name and Date of Birth confirmed. DOB, Name  Transition Care Management Follow-up Telephone Call Date of Discharge: 12/24/23 [facility] Type of Discharge: Inpatient Admission Primary Inpatient Discharge Diagnosis:: GI Bleed How have you been since you were released from the hospital?: Better  Items Reviewed: Did you receive and understand the discharge instructions provided?: Yes Medications obtained,verified, and reconciled?: Yes (Medications Reviewed) Any new allergies since your discharge?: No Dietary orders reviewed?: NA Do you have support at home?: Yes People in Home [RPT]: spouse  Medications Reviewed Today: Medications Reviewed Today     Reviewed by Lavelle Charmaine NOVAK, LPN (Licensed Practical Nurse) on 12/26/23 at 1050  Med List Status: <None>   Medication Order Taking? Sig Documenting Provider Last Dose Status Informant  b complex vitamins capsule [id number] No Take 1 capsule by mouth daily. [provider] Past Week Active Self, Pharmacy Records  benazepril  (LOTENSIN ) 20 MG tablet [id number] No Take 1 tablet (20 mg total) by mouth daily. Kennyth Worth [name], MD Past Week Active Self, Pharmacy Records  Cholecalciferol  (VITAMIN D3) 50 MCG (2000 UT) capsule [id number] No Take 2,000 Units by mouth daily. [provider] Past Week Active Self, Pharmacy Records  conjugated estrogens  (PREMARIN ) vaginal cream [id number] No Place 1 Applicatorful vaginally once a week. Kennyth Worth [name], MD Past Month Active Self, Pharmacy Records  hydrochlorothiazide  (HYDRODIURIL ) 25 MG tablet [id number] No TAKE 1/2 TABLET BY MOUTH EVERY DAY Kennyth Worth [name], MD Past Week Active Self, Pharmacy Records  KLOR-CON  M20 20 MEQ tablet [id number] No TAKE 1 TABLET BY  MOUTH TWICE A DAY Mallipeddi, Diannah SQUIBB, MD Past Week Active Self, Pharmacy Records           Med Note JACKOLYN WADDELL DEL   Fri Dec 23, 2023 10:12 AM) Dispense records do not support compliance.   levothyroxine  (SYNTHROID ) 75 MCG tablet [id number] No Take 1 tablet (75 mcg total) by mouth daily. Kennyth Worth [name], MD Past Week Active Self, Pharmacy Records  lipase/protease/amylase (CREON ) 36000 UNITS CPEP capsule [id number] No Take 2 capsules (72,000 Units total) by mouth 3 (three) times daily with meals. May also take 1 capsule (36,000 Units total) as needed (with snacks - up to 4 snacks daily). Kennyth Worth [name], MD Past Week Active Self, Pharmacy Records           Med Note JACKOLYN WADDELL DEL   Fri Dec 23, 2023 10:12 AM) Dispense records do not support compliance.   magnesium oxide (MAG-OX) 400 (240 Mg) MG tablet [id number] No Take 400 mg by mouth daily. [provider] Past Week Active Self, Pharmacy Records  pantoprazole (PROTONIX) 40 MG tablet [id number]  1 po BID x 4 weeks, then once daily Johnson, Clanford L, MD  Active   zolpidem  (AMBIEN  CR) 12.5 MG CR tablet [id number] No TAKE 1 TABLET(12.5 MG) BY MOUTH AT BEDTIME AS NEEDED FOR SLEEP  Patient taking differently: Take 12.5 mg by mouth at bedtime.   Kennyth Worth [name], MD Past Week Active Self, Pharmacy Records            Home Care and Equipment/Supplies: Were Home Health Services Ordered?: NA Any new equipment or medical supplies ordered?: NA  Functional Questionnaire: Do you need assistance with bathing/showering or dressing?: No Do you need assistance with meal preparation?: No  Do you need assistance with eating?: No Do you have difficulty maintaining continence: No Do you need assistance with getting out of bed/getting out of a chair/moving?: No Do you have difficulty managing or taking your medications?: No  Follow up appointments reviewed: PCP Follow-up appointment confirmed?: Yes Date of PCP follow-up appointment?:  12/30/23 Follow-up Provider: Dr. Kennyth Specialist [facility] Follow-up appointment confirmed?: Yes Date of Specialist follow-up appointment?: 01/20/24 Follow-Up Specialty Provider:: GI- Dr. Avram Do you need transportation to your follow-up appointment?: No Do you understand care options if your condition(s) worsen?: Yes-patient verbalized understanding    SIGNATURE Charmaine Bloodgood, LPN [facility] Health Advisor [facility] You Are. We Are. One [facility] Direct Dial [phone]

## 2023-12-27 LAB — CULTURE, BLOOD (ROUTINE X 2)
Culture: NO GROWTH
Culture: NO GROWTH
Culture: NO GROWTH
Special Requests: ADEQUATE
Special Requests: ADEQUATE

## 2023-12-30 ENCOUNTER — Encounter: Payer: Self-pay | Admitting: Family Medicine

## 2023-12-30 ENCOUNTER — Ambulatory Visit (INDEPENDENT_AMBULATORY_CARE_PROVIDER_SITE_OTHER): Admitting: Family Medicine

## 2023-12-30 VITALS — BP 120/78 | HR 71 | Temp 97.2°F | Ht 64.0 in | Wt 130.4 lb

## 2023-12-30 DIAGNOSIS — I10 Essential (primary) hypertension: Secondary | ICD-10-CM

## 2023-12-30 DIAGNOSIS — K279 Peptic ulcer, site unspecified, unspecified as acute or chronic, without hemorrhage or perforation: Secondary | ICD-10-CM

## 2023-12-30 DIAGNOSIS — E039 Hypothyroidism, unspecified: Secondary | ICD-10-CM | POA: Diagnosis not present

## 2023-12-30 DIAGNOSIS — E785 Hyperlipidemia, unspecified: Secondary | ICD-10-CM

## 2023-12-30 LAB — COMPREHENSIVE METABOLIC PANEL WITH GFR
ALT: 19 U/L (ref 0–35)
AST: 24 U/L (ref 0–37)
Albumin: 4.4 g/dL (ref 3.5–5.2)
Alkaline Phosphatase: 78 U/L (ref 39–117)
BUN: 19 mg/dL (ref 6–23)
CO2: 29 meq/L (ref 19–32)
Calcium: 9.4 mg/dL (ref 8.4–10.5)
Chloride: 95 meq/L — ABNORMAL LOW (ref 96–112)
Creatinine, Ser: 1.31 mg/dL — ABNORMAL HIGH (ref 0.40–1.20)
GFR: 38.1 mL/min — ABNORMAL LOW (ref >60.00)
Glucose, Bld: 97 mg/dL (ref 70–99)
Potassium: 3.6 meq/L (ref 3.5–5.1)
Sodium: 133 meq/L — ABNORMAL LOW (ref 135–145)
Total Bilirubin: 0.5 mg/dL (ref 0.2–1.2)
Total Protein: 7.3 g/dL (ref 6.0–8.3)

## 2023-12-30 LAB — CBC
HCT: 35.6 % — ABNORMAL LOW (ref 36.0–46.0)
Hemoglobin: 12.4 g/dL (ref 12.0–15.0)
MCHC: 34.8 g/dL (ref 30.0–36.0)
MCV: 92.6 fl (ref 78.0–100.0)
Platelets: 297 K/uL (ref 150.0–400.0)
RBC: 3.85 Mil/uL — ABNORMAL LOW (ref 3.87–5.11)
RDW: 13.9 % (ref 11.5–15.5)
WBC: 6.2 K/uL (ref 4.0–10.5)

## 2023-12-30 LAB — LIPID PANEL
Cholesterol: 190 mg/dL (ref 0–200)
HDL: 77.8 mg/dL (ref >39.00)
LDL Cholesterol: 93 mg/dL (ref 0–99)
NonHDL: 112.6
Total CHOL/HDL Ratio: 2
Triglycerides: 99 mg/dL (ref 0.0–149.0)
VLDL: 19.8 mg/dL (ref 0.0–40.0)

## 2023-12-30 LAB — TSH: TSH: 5.7 u[IU]/mL — ABNORMAL HIGH (ref 0.35–5.50)

## 2023-12-30 MED ORDER — AMLODIPINE BESY-BENAZEPRIL HCL 5-10 MG PO CAPS: 1.0000 | ORAL_CAPSULE | Freq: Every day | ORAL | Status: AC

## 2023-12-30 NOTE — Progress Notes (Signed)
 12 wk EGD noted in recall Patient result letter mailed

## 2023-12-30 NOTE — Assessment & Plan Note (Signed)
 She is on Protonix  40 mg twice daily for the next few weeks.  Will recheck labs though she has not had any signs of recurrent GI bleed.  Unclear precipitant for her PUD though very possibly could be related to the Toradol and Depo-Medrol  injections that she received at the orthopedic office.  We also recommended starting iron supplementation today with ferrous sulfate 65 mg every other day.  Will defer further management to GI.

## 2023-12-30 NOTE — Assessment & Plan Note (Signed)
On Lipitor 20 mg daily.  Check lipids today. °

## 2023-12-30 NOTE — Assessment & Plan Note (Signed)
 Blood pressure at goal today.  She restarted her amlodipine -benazepril  5-10.  She is no longer on benazepril  20 mg daily-will take this off med list.  She is also on HCTZ 12.5 mg daily.  She will continue this current regimen and let us  know if persistently elevated at home.  She has not had any leg edema issues since restarting amlodipine  - this may have been due to her uncontrolled hyperthyroidism as they have the amlodipine  though she will let us  know if she has any recurrent issues with leg edema after restarting amlodipine .

## 2023-12-30 NOTE — Patient Instructions (Signed)
 It was very nice to see you today!  VISIT SUMMARY: You had a follow-up visit after your recent hospitalization for a gastrointestinal bleed. We discussed your current medications, symptoms, and overall health. Blood work was ordered to check your anemia and lipid levels, and we made some adjustments to your treatment plan.  YOUR PLAN: GASTRIC ULCER WITH RECENT GI BLEEDING AND IRON DEFICIENCY ANEMIA: You were recently hospitalized for gastrointestinal symptoms and melena. An EGD showed non-bleeding gastric ulcers, and you are currently on PPI therapy. -Continue taking your PPI medication as prescribed. -We will do blood work to check your anemia status. -Start taking iron supplements (ferrous sulfate 65 mg) every other day with food. -Follow up with your gastroenterologist in a few weeks.  HYPOTHYROIDISM: You have an underactive thyroid  and are taking Synthroid . -Continue taking Synthroid  50 mcg daily on an empty stomach in the morning.  HYPERTENSION: You have high blood pressure and are on medication to manage it. -Continue taking your amlodipine  and benazepril  combination as prescribed.  HYPERLIPIDEMIA: You have high cholesterol levels. -We will do a lipid panel as part of your routine blood work.  Return if symptoms worsen or fail to improve.   Take care, Dr Kennyth  PLEASE NOTE:  If you had any lab tests, please let us  know if you have not heard back within a few days. You may see your results on mychart before we have a chance to review them but we will give you a call once they are reviewed by us .   If we ordered any referrals today, please let us  know if you have not heard from their office within the next week.   If you had any urgent prescriptions sent in today, please check with the pharmacy within an hour of our visit to make sure the prescription was transmitted appropriately.   Please try these tips to maintain a healthy lifestyle:  Eat at least 3 REAL meals and 1-2  snacks per day.  Aim for no more than 5 hours between eating.  If you eat breakfast, please do so within one hour of getting up.   Each meal should contain half fruits/vegetables, one quarter protein, and one quarter carbs (no bigger than a computer mouse)  Cut down on sweet beverages. This includes juice, soda, and sweet tea.   Drink at least 1 glass of water  with each meal and aim for at least 8 glasses per day  Exercise at least 150 minutes every week.

## 2023-12-30 NOTE — Assessment & Plan Note (Signed)
 Recently increased Synthroid  75 mcg daily.  Recheck TSH today.  This may have been contributing to her previous issues with leg edema which has since improved.

## 2023-12-30 NOTE — Progress Notes (Signed)
 Chief Complaint:  Linda Knight is a 83 y.o. female who presents today for a TCM visit.  Assessment/Plan:  Chronic Problems Addressed Today: PUD (peptic ulcer disease) She is on Protonix  40 mg twice daily for the next few weeks.  Will recheck labs though she has not had any signs of recurrent GI bleed.  Unclear precipitant for her PUD though very possibly could be related to the Toradol and Depo-Medrol  injections that she received at the orthopedic office.  We also recommended starting iron supplementation today with ferrous sulfate 65 mg every other day.  Will defer further management to GI.  Dyslipidemia On Lipitor 20 mg daily.  Check lipids today.  Hypothyroidism Recently increased Synthroid  75 mcg daily.  Recheck TSH today.  This may have been contributing to her previous issues with leg edema which has since improved.   Essential hypertension Blood pressure at goal today.  She restarted her amlodipine -benazepril  5-10.  She is no longer on benazepril  20 mg daily-will take this off med list.  She is also on HCTZ 12.5 mg daily.  She will continue this current regimen and let us  know if persistently elevated at home.  She has not had any leg edema issues since restarting amlodipine  - this may have been due to her uncontrolled hyperthyroidism as they have the amlodipine  though she will let us  know if she has any recurrent issues with leg edema after restarting amlodipine .     Subjective:  HPI:  Summary of Hospital admission: Reason for admission: GI Bleed Date of admission: 12/22/2023 Date of discharge: 12/24/2023 Date of Interactive contact: 12/26/2023 Summary of Hospital course: Patient presented to the ED on 12/22/2023 with severe nausea, vomiting, and diarrhea as well as passing dark tarry stools.  In the ED she was noted to have significant anemia concerning for GI bleed.  She was admitted for further evaluation and GI was consulted.  Underwent EGD on 11/13 that showed  nonbleeding into gastric ulcers and a clean ulcer base.  She was started on PPI twice daily for 4 weeks to be followed by once afterwards.  They recommended repeat EGD in 8 to 12 weeks. She was discharged home in stable conditions  Interim history:   Discussed the use of AI scribe software for clinical note transcription with the patient, who gave verbal consent to proceed.  History of Present Illness Linda Knight is an 83 year old female who presents for follow-up after hospitalization for a gastrointestinal bleed.  She presented to the emergency department on December 22, 2023, with severe nausea, vomiting, diarrhea, and melena. Significant anemia was noted, and she was admitted for further evaluation. An esophagogastroduodenoscopy (EGD) performed on the same day revealed non-bleeding gastric ulcers with a clean ulcer base. She was started on a proton pump inhibitor (PPI) twice daily for four weeks, followed by once daily. A repeat EGD was recommended in 8 to 12 weeks.  She received two injections at the orthopedic office a day before the onset of symptoms, which included Toradol and Depimidrol. Diarrhea and vomiting began a day and a half after the injections. She has not had a similar reaction to steroid injections in the past.  She has a history of pancreatitis and a pancreatic cyst, which she believes may contribute to her stomach issues when consuming certain foods like ice cream.  She experiences fatigue and low energy levels. She did not lose consciousness but was unable to get up from the floor during the episode. No  recent melena or gastrointestinal symptoms since hospitalization, and her bowel movements are regular.  Her current medications include a PPI, benazepril , and amlodipine . She is also taking half a tablet of hydrochlorothiazide . She experiences dizziness when getting up too quickly after taking Protonix .  She did have issues with elevated blood pressure reading while in  the hospital and restarted her amlodipine  benazepril  combination pill.  She is no longer taking benazepril  20 mg daily.  We previously stopped amlodipine  several weeks ago due to concern for leg edema however she has not had any issues with this since restarting.     ROS: Per HPI, otherwise a complete review of systems was negative.   PMH:  The following were reviewed and entered/updated in epic: Past Medical History:  Diagnosis Date   Anemia    Arthritis    CAD (coronary artery disease)    High cholesterol    Hypertension    Pancreatic cyst    SCCA (squamous cell carcinoma) of skin 2019   Right Arm [facility])   Sleep apnea    Squamous cell carcinoma of skin 2019   Left Leg [facility])   Thyroid  disease    Patient Active Problem List   Diagnosis Date Noted   PUD (peptic ulcer disease) 12/24/2023   Leg edema 11/22/2023   Constipation 11/22/2023   Pancreas cyst 10/24/2023   Hypokalemia due to excessive renal loss of potassium 08/10/2022   PAC (premature atrial contraction) 08/10/2022   Atrial tachycardia 08/10/2022   Chondrocalcinosis of left knee 06/17/2021   Dry eyes 05/18/2021   Osteoarthritis 05/18/2021   Pelvic floor dysfunction 03/11/2021   Anxiety 09/15/2020   History of melanoma 11/14/2019   Pancreatic insufficiency 07/17/2019   Stress 07/17/2019   B12 deficiency 05/29/2019   Chronic low back pain with sciatica 11/27/2018   Tinnitus of both ears 09/19/2018   Perforation of left tympanic membrane 09/19/2018   Stenosis of carotid artery 09/19/2018   Female bladder prolapse 09/19/2018   Essential hypertension 09/19/2018   Dyslipidemia 09/19/2018   Hypothyroidism 09/19/2018   Menopausal symptoms 09/19/2018   Insomnia 09/19/2018   Allergic rhinitis 09/19/2018   Osteopenia 09/19/2018   Past Surgical History:  Procedure Laterality Date   ABDOMINAL HYSTERECTOMY     APPENDECTOMY     BIOPSY  09/27/2019   Procedure: BIOPSY;  Surgeon: Teressa Toribio SQUIBB, MD;   Location: [facility] ENDOSCOPY;  Service: Endoscopy;;   BREAST EXCISIONAL BIOPSY Right 1993   BUNIONECTOMY     carcinoma removed Right 09/2020   CYSTOCELE REPAIR     bladder repair   ESOPHAGOGASTRODUODENOSCOPY N/A 12/22/2023   Procedure: EGD (ESOPHAGOGASTRODUODENOSCOPY);  Surgeon: Cinderella Deatrice FALCON, MD;  Location: [facility] SUITE;  Service: Endoscopy;  Laterality: N/A;   ESOPHAGOGASTRODUODENOSCOPY (EGD) WITH PROPOFOL  N/A 09/27/2019   Procedure: ESOPHAGOGASTRODUODENOSCOPY (EGD) WITH PROPOFOL ;  Surgeon: Teressa Toribio SQUIBB, MD;  Location: [facility] ENDOSCOPY;  Service: Endoscopy;  Laterality: N/A;   EUS N/A 09/27/2019   Procedure: UPPER ENDOSCOPIC ULTRASOUND (EUS) RADIAL;  Surgeon: Teressa Toribio SQUIBB, MD;  Location: [facility];  Service: Endoscopy;  Laterality: N/A;   KNEE ARTHROSCOPY  06/2021   TYMPANOPLASTY Left 06/2020   VAGINAL PROLAPSE REPAIR  12/07/2019    Family History  Problem Relation Age of Onset   Hyperlipidemia Mother    Hypertension Mother    Stroke Mother    Lung disease Father    Breast cancer Cousin 50 - 92   Heart disease Brother    Colon cancer Neg Hx  Medications- Reconciled discharge and current medications in Epic.  Current Outpatient Medications  Medication Sig Dispense Refill   amLODipine -benazepril  (LOTREL) 5-10 MG capsule Take 1 capsule by mouth daily.     b complex vitamins capsule Take 1 capsule by mouth daily.     conjugated estrogens  (PREMARIN ) vaginal cream Place 1 Applicatorful vaginally once a week. 42.5 g 12   hydrochlorothiazide  (HYDRODIURIL ) 25 MG tablet TAKE 1/2 TABLET BY MOUTH EVERY DAY 45 tablet 3   KLOR-CON  M20 20 MEQ tablet TAKE 1 TABLET BY MOUTH TWICE A DAY 180 tablet 3   levothyroxine  (SYNTHROID ) 75 MCG tablet Take 1 tablet (75 mcg total) by mouth daily. 90 tablet 0   lipase/protease/amylase (CREON ) 36000 UNITS CPEP capsule Take 2 capsules (72,000 Units total) by mouth 3 (three) times daily with meals. May also take 1 capsule (36,000 Units total) as needed  (with snacks - up to 4 snacks daily). 300 capsule 11   magnesium  oxide (MAG-OX) 400 (240 Mg) MG tablet Take 400 mg by mouth daily.     pantoprazole  (PROTONIX ) 40 MG tablet 1 po BID x 4 weeks, then once daily 60 tablet 1   zolpidem  (AMBIEN  CR) 12.5 MG CR tablet TAKE 1 TABLET(12.5 MG) BY MOUTH AT BEDTIME AS NEEDED FOR SLEEP (Patient taking differently: Take 12.5 mg by mouth at bedtime.) 30 tablet 5   No current facility-administered medications for this visit.    Allergies-reviewed and updated Allergies  Allergen Reactions   Tape Rash    Social History   Socioeconomic History   Marital status: Married    Spouse name: Not on file   Number of children: 3   Years of education: Not on file   Highest education level: Not on file  Occupational History   Occupation: Retired   Tobacco Use   Smoking status: Former    Current packs/day: 0.00    Types: Cigarettes    Quit date: 09/18/1980    Years since quitting: 43.3   Smokeless tobacco: Never  Vaping Use   Vaping status: Never Used  Substance and Sexual Activity   Alcohol use: Not Currently    Comment: no etoh in two weeks (12/22/23), previously drink of whiskey nightly with dinner several nights per week   Drug use: Never   Sexual activity: Not Currently    Comment: 1st intercourse 83 yo-Fewer than 5 partners  Other Topics Concern   Not on file  Social History Narrative   Moved from [place] July 2020   Social Drivers of Health   Financial Resource Strain: Low Risk  (01/12/2023)   Overall Financial Resource Strain (CARDIA)    Difficulty of Paying Living Expenses: Not hard at all  Food Insecurity: No Food Insecurity (12/22/2023)   Hunger Vital Sign    Worried About Running Out of Food in the Last Year: Never true    Ran Out of Food in the Last Year: Never true  Transportation Needs: No Transportation Needs (12/22/2023)   PRAPARE - Administrator, Civil Service (Medical): No    Lack of Transportation  (Non-Medical): No  Physical Activity: Insufficiently Active (01/12/2023)   Exercise Vital Sign    Days of Exercise per Week: 3 days    Minutes of Exercise per Session: 20 min  Stress: No Stress Concern Present (01/12/2023)   [employer] of Occupational Health - Occupational Stress Questionnaire    Feeling of Stress : Not at all  Social Connections: Moderately Isolated (12/22/2023)   Social Connection  and Isolation Panel    Frequency of Communication with Friends and Family: More than three times a week    Frequency of Social Gatherings with Friends and Family: More than three times a week    Attends Religious Services: Never    Database Administrator or Organizations: No    Attends Engineer, Structural: Never    Marital Status: Married        Objective:  Physical Exam: BP 120/78   Pulse 71   Temp (!) 97.2 F (36.2 C) (Temporal)   Ht 5' 4 (1.626 m)   Wt 130 lb 6.4 oz (59.1 kg)   SpO2 97%   BMI 22.38 kg/m   Gen: NAD, resting comfortably CV: RRR with no murmurs appreciated Pulm: NWOB, CTAB with no crackles, wheezes, or rhonchi GI: Normal bowel sounds present. Soft, Nontender, Nondistended. MSK: No edema, cyanosis, or clubbing noted Skin: Warm, dry Neuro: Grossly normal, moves all extremities Psych: Normal affect and thought content  Time Spent: 45 minutes of total time was spent on the date of the encounter performing the following actions: chart review prior to seeing the patient including recent hospitalization, obtaining history, performing a medically necessary exam, counseling on the treatment plan, placing orders, and documenting in our EHR.       Worth [name]. Kennyth, MD 12/30/2023 11:59 AM

## 2024-01-02 ENCOUNTER — Ambulatory Visit: Payer: Self-pay | Admitting: Family Medicine

## 2024-01-02 DIAGNOSIS — I10 Essential (primary) hypertension: Secondary | ICD-10-CM

## 2024-01-02 DIAGNOSIS — E039 Hypothyroidism, unspecified: Secondary | ICD-10-CM

## 2024-01-02 NOTE — Progress Notes (Signed)
 Her blood counts are improving.  Her kidney numbers took a small hit during her recent hospitalization.  I would like for her to come back to recheck in a week or 2.  Please place future order for bmet.  Her thyroid  level is also off.  This is probably due to the recent hospitalization.  We can recheck this again in a few weeks.  Please place future order for TSH, free T4, and free T3.  Her cholesterol levels are at goal.

## 2024-01-11 ENCOUNTER — Telehealth: Payer: Self-pay | Admitting: Internal Medicine

## 2024-01-11 NOTE — Telephone Encounter (Signed)
 Left message on machine to call back

## 2024-01-11 NOTE — Telephone Encounter (Signed)
 Inbound call from patient stating she is concerned she is still having a bleed. States her stool is still very black. Patient is scheduled to see Dr. Avram on 12/12. Requesting to know if she needs to be seen sooner. Please advise, thank you

## 2024-01-12 NOTE — Telephone Encounter (Signed)
 The pt has a history of GI bleed with recent hospitalization. She is concerned that her appt is on 12/12 and would like to be seen sooner.  I have an apt made with [name] for tomorrow at 1020 am for follow up. Due to risk of bad weather in the morning I have left her appt on 12/12 with Dr Avram in case she is not able to get here.  She has been educated on ED precautions. Her only complaint today is continued dark stools- no visible bleeding, nausea or other symptoms that she has had in the past.

## 2024-01-12 NOTE — Progress Notes (Deleted)
 Chief Complaint: Black stool  HPI:    Linda Knight is an 83 year old Caucasian female, known to Dr. Avram with a past medical history as listed below, who was referred to me by Kennyth Worth [name], MD for a complaint of black stool.      10/24/2023 patient seen in clinic by Harlene Mail, PA and at that time noted a complicated history of mildly dilated pancreatic duct, pancreatic cystic lesions but no chronic pancreatitis on EUS.  On Creon .  Also complicated pelvic floor history of corrective surgery through urogynecology.  At that time discussed that Creon  helped with abdominal pain and gas but did leave her constipated times where she took MiraLAX.  That time discussed some discomfort in her upper abdomen after she ate.  At that point continued on Creon  72,000 units with meals and 36,000 units with snacks.  MiraLAX as needed.  Recommended repeat MRI/MRCP in October 2025.  This was scheduled.  Noted that if lesion was stable then could consider trial of PPI or famotidine for gastritis or reflux type symptoms.    11/02/2023 MRI/MRCP with redemonstration of multiple cystic lesions in the pancreas with aletris measuring up to 6 x 8 mm in the pancreatic head.  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  No suspicious features.  Follow-up recommended in 2 years.    12/22/2023 EGD done at [facility] for melena with normal esophagus, nonbleeding gastric ulcers with clean base and nonbleeding gastric ulcer with a nonbleeding visible vessel clipped, nonbleeding duodenal diverticulum.  At that time recommended surveillance upper endoscopy in 8 to 12 weeks to assess healing of ulcers and maintain on a PPI twice daily for 4 weeks and then daily.  GI history: MRI abdomen MRCP 11/2021:   IMPRESSION: 1. Stable 7 mm cystic lesion in the head of the pancreas. No change from earliest available comparison of 08/10/2019, documenting 27 months of stability. Possibilities include postinflammatory cyst  or small intraductal papillary mucinous neoplasm. Follow up pancreatic protocol MRI is recommended in 2 years time. This recommendation follows ACR consensus guidelines: Management of Incidental Pancreatic Cysts: A White Paper of the ACR Incidental Findings Committee. J Am Coll Radiol 2017;14:911-923. 2. Stable 2.4 cm cyst in the dome of the liver, cannot exclude cystadenoma based on mild internal septation, but no internal nodularity or progression. Other tiny hepatic cysts appear benign. 3. 0.7 cm Bosniak category 1 cyst of the left kidney lower pole posteriorly. This requires no further workup. 4. Levoconvex lumbar scoliosis with rotary component and partial butterfly vertebra T10.   Pancreatic insufficiency    - recurrent symptoms after stopping Creon  Pancreatic cyst and dilated pancreatic duct on recent CT and MRI    - normal pancreas on CT in [facility] in 2019    - Abnormal pancreas on CT 07/05/19    - Abnormal pancreas on MRI 08/10/19    - EGD/EUS 09/27/19:        - small 5-12mm pancreatic cysts (body and head). No solid lesions. Nl PD.     - MRI/MRCP 10/30/20: no change    - MRI/MRCP 11/24/21: overall stable History of altered bowel habits following colpopexy and bladder repair 10/21    - no improvement with Xifaxan  x 14 days for possible SIBO Periampullary duodenal diverticulum Abnormal MR lumbar spine 2021 Elevated lipase 07/2019 Recent abdominal pain, resolved with dietary changes Daily alcohol use, with history of heavier use Glucose intolerance Chronic constipation treated with Miralax prn and magnesium  Adenomyomatosis of the gallbladder fundus.  Prior screening colonoscopy in [facility]  Former smoker  Past Medical History:  Diagnosis Date   Anemia    Arthritis    CAD (coronary artery disease)    High cholesterol    Hypertension    Pancreatic cyst    SCCA (squamous cell carcinoma) of skin 2019   Right Arm [facility])   Sleep apnea    Squamous cell carcinoma of  skin 2019   Left Leg [facility])   Thyroid  disease     Past Surgical History:  Procedure Laterality Date   ABDOMINAL HYSTERECTOMY     APPENDECTOMY     BIOPSY  09/27/2019   Procedure: BIOPSY;  Surgeon: Teressa Toribio SQUIBB, MD;  [facility];  Service: Endoscopy;;   BREAST EXCISIONAL BIOPSY Right 1993   BUNIONECTOMY     carcinoma removed Right 09/2020   CYSTOCELE REPAIR     bladder repair   ESOPHAGOGASTRODUODENOSCOPY N/A 12/22/2023   Procedure: EGD (ESOPHAGOGASTRODUODENOSCOPY);  Surgeon: Cinderella Deatrice FALCON, MD;  [facility];  Service: Endoscopy;  Laterality: N/A;   ESOPHAGOGASTRODUODENOSCOPY (EGD) WITH PROPOFOL  N/A 09/27/2019   Procedure: ESOPHAGOGASTRODUODENOSCOPY (EGD) WITH PROPOFOL ;  Surgeon: Teressa Toribio SQUIBB, MD;  [facility];  Service: Endoscopy;  Laterality: N/A;   EUS N/A 09/27/2019   Procedure: UPPER ENDOSCOPIC ULTRASOUND (EUS) RADIAL;  Surgeon: Teressa Toribio SQUIBB, MD;  [facility];  Service: Endoscopy;  Laterality: N/A;   KNEE ARTHROSCOPY  06/2021   TYMPANOPLASTY Left 06/2020   VAGINAL PROLAPSE REPAIR  12/07/2019    Current Outpatient Medications  Medication Sig Dispense Refill   amLODipine -benazepril  (LOTREL) 5-10 MG capsule Take 1 capsule by mouth daily.     b complex vitamins capsule Take 1 capsule by mouth daily.     conjugated estrogens  (PREMARIN ) vaginal cream Place 1 Applicatorful vaginally once a week. 42.5 g 12   hydrochlorothiazide  (HYDRODIURIL ) 25 MG tablet TAKE 1/2 TABLET BY MOUTH EVERY DAY 45 tablet 3   KLOR-CON  M20 20 MEQ tablet TAKE 1 TABLET BY MOUTH TWICE A DAY 180 tablet 3   levothyroxine  (SYNTHROID ) 75 MCG tablet Take 1 tablet (75 mcg total) by mouth daily. 90 tablet 0   lipase/protease/amylase (CREON ) 36000 UNITS CPEP capsule Take 2 capsules (72,000 Units total) by mouth 3 (three) times daily with meals. May also take 1 capsule (36,000 Units total) as needed (with snacks - up to 4 snacks daily). 300 capsule 11    magnesium  oxide (MAG-OX) 400 (240 Mg) MG tablet Take 400 mg by mouth daily.     pantoprazole  (PROTONIX ) 40 MG tablet 1 po BID x 4 weeks, then once daily 60 tablet 1   zolpidem  (AMBIEN  CR) 12.5 MG CR tablet TAKE 1 TABLET(12.5 MG) BY MOUTH AT BEDTIME AS NEEDED FOR SLEEP (Patient taking differently: Take 12.5 mg by mouth at bedtime.) 30 tablet 5   No current facility-administered medications for this visit.    Allergies as of 01/13/2024 - Review Complete 12/30/2023  Allergen Reaction Noted   Tape Rash 09/29/2020    Family History  Problem Relation Age of Onset   Hyperlipidemia Mother    Hypertension Mother    Stroke Mother    Lung disease Father    Breast cancer Cousin 79 - 67   Heart disease Brother    Colon cancer Neg Hx     Social History   Socioeconomic History   Marital status: Married    Spouse name: Not on file   Number of children: 3   Years  of education: Not on file   Highest education level: Not on file  Occupational History   Occupation: Retired   Tobacco Use   Smoking status: Former    Current packs/day: 0.00    Types: Cigarettes    Quit date: 09/18/1980    Years since quitting: 43.3   Smokeless tobacco: Never  Vaping Use   Vaping status: Never Used  Substance and Sexual Activity   Alcohol use: Not Currently    Comment: no etoh in two weeks (12/22/23), previously drink of whiskey nightly with dinner several nights per week   Drug use: Never   Sexual activity: Not Currently    Comment: 1st intercourse 83 yo-Fewer than 5 partners  Other Topics Concern   Not on file  Social History Narrative   Moved from [place] July 2020   Social Drivers of Health   Financial Resource Strain: Low Risk  (01/12/2023)   Overall Financial Resource Strain (CARDIA)    Difficulty of Paying Living Expenses: Not hard at all  Food Insecurity: No Food Insecurity (12/22/2023)   Hunger Vital Sign    Worried About Running Out of Food in the Last Year: Never true    Ran Out of  Food in the Last Year: Never true  Transportation Needs: No Transportation Needs (12/22/2023)   PRAPARE - Administrator, Civil Service (Medical): No    Lack of Transportation (Non-Medical): No  Physical Activity: Insufficiently Active (01/12/2023)   Exercise Vital Sign    Days of Exercise per Week: 3 days    Minutes of Exercise per Session: 20 min  Stress: No Stress Concern Present (01/12/2023)   [employer] of Occupational Health - Occupational Stress Questionnaire    Feeling of Stress : Not at all  Social Connections: Moderately Isolated (12/22/2023)   Social Connection and Isolation Panel    Frequency of Communication with Friends and Family: More than three times a week    Frequency of Social Gatherings with Friends and Family: More than three times a week    Attends Religious Services: Never    Database Administrator or Organizations: No    Attends Banker Meetings: Never    Marital Status: Married  Catering Manager Violence: Not At Risk (12/22/2023)   Humiliation, Afraid, Rape, and Kick questionnaire    Fear of Current or Ex-Partner: No    Emotionally Abused: No    Physically Abused: No    Sexually Abused: No    Review of Systems:    Constitutional: No weight loss, fever, chills, weakness or fatigue HEENT: Eyes: No change in vision               Ears, Nose, Throat:  No change in hearing or congestion Skin: No rash or itching Cardiovascular: No chest pain, chest pressure or palpitations   Respiratory: No SOB or cough Gastrointestinal: See HPI and otherwise negative Genitourinary: No dysuria or change in urinary frequency Neurological: No headache, dizziness or syncope Musculoskeletal: No new muscle or joint pain Hematologic: No bleeding or bruising Psychiatric: No history of depression or anxiety    Physical Exam:  Vital signs: There were no vitals taken for this visit.  Constitutional:   Pleasant Caucasian female appears to be in NAD,  Well developed, Well nourished, alert and cooperative Head:  Normocephalic and atraumatic. Eyes:   PEERL, EOMI. No icterus. Conjunctiva pink. Ears:  Normal auditory acuity. Neck:  Supple Throat: Oral cavity and pharynx without inflammation, swelling or lesion.  Respiratory: Respirations even and unlabored. Lungs clear to auscultation bilaterally.   No wheezes, crackles, or rhonchi.  Cardiovascular: Normal S1, S2. No MRG. Regular rate and rhythm. No peripheral edema, cyanosis or pallor.  Gastrointestinal:  Soft, nondistended, nontender. No rebound or guarding. Normal bowel sounds. No appreciable masses or hepatomegaly. Rectal:  Not performed.  Msk:  Symmetrical without gross deformities. Without edema, no deformity or joint abnormality.  Neurologic:  Alert and  oriented x4;  grossly normal neurologically.  Skin:   Dry and intact without significant lesions or rashes. Psychiatric: Oriented to person, place and time. Demonstrates good judgement and reason without abnormal affect or behaviors.  RELEVANT LABS AND IMAGING: CBC    Component Value Date/Time   WBC 6.2 12/30/2023 1200   RBC 3.85 (L) 12/30/2023 1200   HGB 12.4 12/30/2023 1200   HCT 35.6 (L) 12/30/2023 1200   PLT 297.0 12/30/2023 1200   MCV 92.6 12/30/2023 1200   MCH 31.9 12/23/2023 0347   MCHC 34.8 12/30/2023 1200   RDW 13.9 12/30/2023 1200   LYMPHSABS 1.4 08/30/2020 1647   MONOABS 0.4 08/30/2020 1647   EOSABS 0.0 08/30/2020 1647   BASOSABS 0.0 08/30/2020 1647    CMP     Component Value Date/Time   NA 133 (L) 12/30/2023 1200   K 3.6 12/30/2023 1200   CL 95 (L) 12/30/2023 1200   CO2 29 12/30/2023 1200   GLUCOSE 97 12/30/2023 1200   BUN 19 12/30/2023 1200   CREATININE 1.31 (H) 12/30/2023 1200   CREATININE 0.94 (H) 11/14/2019 0839   CALCIUM  9.4 12/30/2023 1200   PROT 7.3 12/30/2023 1200   ALBUMIN 4.4 12/30/2023 1200   AST 24 12/30/2023 1200   ALT 19 12/30/2023 1200   ALKPHOS 78 12/30/2023 1200   BILITOT 0.5  12/30/2023 1200   GFRNONAA >60 12/23/2023 0347    Assessment: 1.  Black stool: Recent admission in November at [facility] for melena, EGD at that time with multiple nonbleeding gastric ulcers, 1 with a visible vessel clipped and told to follow-up with repeat EGD in 8 to 12 weeks to assess for healing 2.  Epigastric pain: With above 3.  History of pancreatic cyst: Followed every 2 years with imaging, last MRI/MRCP 9/24 with stability of lesions and repeat recommended in 2 years 4.  Pancreatic insufficiency: Maintained on Creon   Plan: 1.  Continue Pantoprazole  40 mgtwice daily and decrease to once daily on 01/21/2024 2.  Schedule patient for repeat EGD with Dr. Avram 8 to 12 weeks from her last.  Did discuss risk and benefits, notations and alternatives and the patient agrees to proceed.   [name] Failing, PA-C [facility] 01/12/2024, 2:16 PM  Cc: Kennyth Worth [name], MD

## 2024-01-12 NOTE — Telephone Encounter (Signed)
 Left message on machine to call back

## 2024-01-13 ENCOUNTER — Ambulatory Visit: Admitting: Physician Assistant

## 2024-01-18 ENCOUNTER — Other Ambulatory Visit (INDEPENDENT_AMBULATORY_CARE_PROVIDER_SITE_OTHER)

## 2024-01-18 DIAGNOSIS — E039 Hypothyroidism, unspecified: Secondary | ICD-10-CM | POA: Diagnosis not present

## 2024-01-18 DIAGNOSIS — R899 Unspecified abnormal finding in specimens from other organs, systems and tissues: Secondary | ICD-10-CM | POA: Diagnosis not present

## 2024-01-18 DIAGNOSIS — I10 Essential (primary) hypertension: Secondary | ICD-10-CM

## 2024-01-18 LAB — COMPREHENSIVE METABOLIC PANEL WITH GFR
ALT: 24 U/L (ref 0–35)
AST: 30 U/L (ref 0–37)
Albumin: 4.2 g/dL (ref 3.5–5.2)
Alkaline Phosphatase: 91 U/L (ref 39–117)
BUN: 19 mg/dL (ref 6–23)
CO2: 32 meq/L (ref 19–32)
Calcium: 9.1 mg/dL (ref 8.4–10.5)
Chloride: 87 meq/L — ABNORMAL LOW (ref 96–112)
Creatinine, Ser: 1.21 mg/dL — ABNORMAL HIGH (ref 0.40–1.20)
GFR: 41.89 mL/min — ABNORMAL LOW (ref >60.00)
Glucose, Bld: 139 mg/dL — ABNORMAL HIGH (ref 70–99)
Potassium: 3.6 meq/L (ref 3.5–5.1)
Sodium: 126 meq/L — ABNORMAL LOW (ref 135–145)
Total Bilirubin: 0.5 mg/dL (ref 0.2–1.2)
Total Protein: 6.6 g/dL (ref 6.0–8.3)

## 2024-01-18 LAB — T3, FREE: T3, Free: 2.3 pg/mL (ref 2.3–4.2)

## 2024-01-18 LAB — T4, FREE: Free T4: 0.93 ng/dL (ref 0.60–1.60)

## 2024-01-18 LAB — CBC WITH DIFFERENTIAL/PLATELET
Basophils Absolute: 0 K/uL (ref 0.0–0.1)
Basophils Relative: 0.5 % (ref 0.0–3.0)
Eosinophils Absolute: 0 K/uL (ref 0.0–0.7)
Eosinophils Relative: 0.6 % (ref 0.0–5.0)
HCT: 31 % — ABNORMAL LOW (ref 36.0–46.0)
Hemoglobin: 11.1 g/dL — ABNORMAL LOW (ref 12.0–15.0)
Lymphocytes Relative: 23.1 % (ref 12.0–46.0)
Lymphs Abs: 1.2 K/uL (ref 0.7–4.0)
MCHC: 35.7 g/dL (ref 30.0–36.0)
MCV: 91.4 fl (ref 78.0–100.0)
Monocytes Absolute: 0.4 K/uL (ref 0.1–1.0)
Monocytes Relative: 7.3 % (ref 3.0–12.0)
Neutro Abs: 3.6 K/uL (ref 1.4–7.7)
Neutrophils Relative %: 68.5 % (ref 43.0–77.0)
Platelets: 190 K/uL (ref 150.0–400.0)
RBC: 3.4 Mil/uL — ABNORMAL LOW (ref 3.87–5.11)
RDW: 13.8 % (ref 11.5–15.5)
WBC: 5.2 K/uL (ref 4.0–10.5)

## 2024-01-18 LAB — BASIC METABOLIC PANEL WITH GFR
BUN: 18 mg/dL (ref 6–23)
CO2: 30 meq/L (ref 19–32)
Calcium: 8.8 mg/dL (ref 8.4–10.5)
Chloride: 87 meq/L — ABNORMAL LOW (ref 96–112)
Creatinine, Ser: 1.24 mg/dL — ABNORMAL HIGH (ref 0.40–1.20)
GFR: 40.68 mL/min — ABNORMAL LOW (ref >60.00)
Glucose, Bld: 135 mg/dL — ABNORMAL HIGH (ref 70–99)
Potassium: 3.5 meq/L (ref 3.5–5.1)
Sodium: 126 meq/L — ABNORMAL LOW (ref 135–145)

## 2024-01-18 LAB — TSH
TSH: 2.78 u[IU]/mL (ref 0.35–5.50)
TSH: 2.9 u[IU]/mL (ref 0.35–5.50)

## 2024-01-19 ENCOUNTER — Ambulatory Visit: Payer: Self-pay | Admitting: Family Medicine

## 2024-01-19 DIAGNOSIS — E871 Hypo-osmolality and hyponatremia: Secondary | ICD-10-CM

## 2024-01-19 NOTE — Progress Notes (Signed)
 Thyroid  levels are back to normal.  Do not need to make any adjustments to her thyroid  medication at this time.  We can recheck again at her next office visit.  Her kidney numbers are slowly improving though still not quite back to her baseline.  Recommend referral to nephrology for further evaluation.  Her hemoglobin did drop a little bit over the last couple of weeks.  Due to her recent hospitalization due to GI bleed we should probably this 1 more time before Christmas.  Please have her come back in a week or 2 to recheck CBC.

## 2024-01-20 ENCOUNTER — Encounter: Payer: Self-pay | Admitting: Internal Medicine

## 2024-01-20 ENCOUNTER — Ambulatory Visit: Admitting: Internal Medicine

## 2024-01-20 VITALS — BP 122/74 | HR 57 | Ht 64.0 in | Wt 132.5 lb

## 2024-01-20 DIAGNOSIS — K250 Acute gastric ulcer with hemorrhage: Secondary | ICD-10-CM

## 2024-01-20 DIAGNOSIS — D62 Acute posthemorrhagic anemia: Secondary | ICD-10-CM | POA: Diagnosis not present

## 2024-01-20 MED ORDER — OMEPRAZOLE 40 MG PO CPDR: 40.0000 mg | DELAYED_RELEASE_CAPSULE | Freq: Every day | ORAL | 3 refills | Status: AC

## 2024-01-20 NOTE — Patient Instructions (Addendum)
 You have been scheduled for an endoscopy. Please follow written instructions given to you at your visit today.  If you use inhalers (even only as needed), please bring them with you on the day of your procedure.  If you take any of the following medications, they will need to be adjusted prior to your procedure:   DO NOT TAKE 7 DAYS PRIOR TO TEST- Trulicity (dulaglutide) Ozempic, Wegovy (semaglutide) Mounjaro, Zepbound (tirzepatide) Bydureon Bcise (exanatide extended release)  DO NOT TAKE 1 DAY PRIOR TO YOUR TEST Rybelsus (semaglutide) Adlyxin (lixisenatide) Victoza (liraglutide) Byetta (exanatide) ________________________________________________________________________  Stop your pantoprazole  and start omeprazole. Take it daily 30 minutes prior to breakfast. Rx has been sent in to your pharmacy.  Take your iron several times a week.   I appreciate the opportunity to care for you. Lupita Commander, MD, [facility]

## 2024-01-20 NOTE — Progress Notes (Addendum)
 Linda Knight 82 y.o. Apr 18, 1940 [id number]  Assessment & Plan:   Encounter Diagnoses  Name Primary?   Acute gastric ulcer with hemorrhage Yes   Acute blood loss anemia     I have asked the patient to try to take iron at least 2 times a week.  It causes gastrointestinal distress, dyspepsia.  Also mention trying other agents like ferrous gluconate or ferrous fumarate.  Will repeat group on that when she returns.  It looks like Dr. Kennyth is going to recheck her hemoglobin again since it was slightly lower compared to discharge summary but I doubt that she is having ongoing bleeding.  Changed pantoprazole  to omeprazole given complaint of pruritus with pantoprazole .  Meds ordered this encounter  Medications   omeprazole (PRILOSEC) 40 MG capsule    Sig: Take 1 capsule (40 mg total) by mouth daily.    Dispense:  90 capsule    Refill:  3   Follow-up EGD in February to ensure healing of gastric ulcers.The risks and benefits as well as alternatives of endoscopic procedure(s) have been discussed and reviewed. All questions answered. The patient agrees to proceed.    Subjective:   Chief Complaint: Gastric ulcers  HPI 83 year old woman with a history of pancreatic cysts and low fecal elastase here for follow-up after an admission to [facility] with melena and GI bleeding.  EGD by Dr. Cinderella 12/22/2023 (images reviewed) demonstrating many nonbleeding linear gastric ulcers at the incisor and in the antrum, and a nonbleeding gastric ulcer with a nonbleeding visible vessel seen in the posterior wall of the gastric antrum.  This latter ulcer was treated with a hemostatic clip.  No bleeding before and during the procedure.  The smaller linear ulcers were biopsied and biopsies are benign and there was no H. pylori.  There was also a diverticulum in the second portion of the duodenum.  She had not been taking NSAIDs or aspirin.  She did have a glass of whiskey every night prior to that  and stopped since then.  She feels improved.  She has some difficulty tolerating iron she tried it but has stopped taking it.  She saw Dr. Kennyth of primary care 1 day ago and had labs done, I have reviewed that note.  She is without abdominal pain nausea or vomiting or dysphagia at this time.  She does complain of itching with pantoprazole .  No rash.    Latest Ref Rng & Units 01/18/2024   10:58 AM 12/30/2023   12:00 PM 12/23/2023    3:47 AM  CBC  WBC 4.0 - 10.5 K/uL 5.2  6.2  7.3   Hemoglobin 12.0 - 15.0 g/dL 88.8  87.5  88.4   Hematocrit 36.0 - 46.0 % 31.0  35.6  33.1   Platelets 150.0 - 400.0 K/uL 190.0  297.0  165    She has not had iron studies done. B12 and folate were normal.  She was transfused 3 units of packed red cells during her November [facility] admission.  I have reviewed the gastroenterology notes from that admission.   EUS 09/2019: -normal esophagus -gastritis, neg H.pylori -medium sized periampullary duodenal diverticulum -normal non-dilated main pancreatic duct -CBD normal -no peripancreatic adenopathy -two small, innocent appearing pancreatic cysts -somewhat atrophic pancreatic parenchyma throughout   Colonoscopy 10/2020: -colon normal, s/p biopsy, mild edema but essentially normal -diverticulosis -non-bleeding internal hemorrhoids   Allergies[1] Active Medications[2] Past Medical History:  Diagnosis Date   Anemia    Arthritis  CAD (coronary artery disease)    High cholesterol    Hypertension    Pancreatic cyst    Pancreatic insufficiency    PUD (peptic ulcer disease)    SCCA (squamous cell carcinoma) of skin 2019   Right Arm [facility])   Sleep apnea    Squamous cell carcinoma of skin 2019   Left Leg [facility])   Thyroid  disease    Upper GI bleed    Past Surgical History:  Procedure Laterality Date   ABDOMINAL HYSTERECTOMY     APPENDECTOMY     BIOPSY  09/27/2019   Procedure: BIOPSY;  Surgeon: Teressa Toribio SQUIBB, MD;  [facility];  Service: Endoscopy;;   BREAST EXCISIONAL BIOPSY Right 1993   BUNIONECTOMY     carcinoma removed Right 09/2020   CYSTOCELE REPAIR     bladder repair   ESOPHAGOGASTRODUODENOSCOPY N/A 12/22/2023   Procedure: EGD (ESOPHAGOGASTRODUODENOSCOPY);  Surgeon: Cinderella Deatrice FALCON, MD;  [facility];  Service: Endoscopy;  Laterality: N/A;   ESOPHAGOGASTRODUODENOSCOPY (EGD) WITH PROPOFOL  N/A 09/27/2019   Procedure: ESOPHAGOGASTRODUODENOSCOPY (EGD) WITH PROPOFOL ;  Surgeon: Teressa Toribio SQUIBB, MD;  [facility];  Service: Endoscopy;  Laterality: N/A;   EUS N/A 09/27/2019   Procedure: UPPER ENDOSCOPIC ULTRASOUND (EUS) RADIAL;  Surgeon: Teressa Toribio SQUIBB, MD;  [facility];  Service: Endoscopy;  Laterality: N/A;   KNEE ARTHROSCOPY  06/2021   TYMPANOPLASTY Left 06/2020   VAGINAL PROLAPSE REPAIR  12/07/2019   Social History   Social History Narrative   Moved from [place] July 2020   Married lives with husband   2 adult children and grandkids      Was whiskey nightly - stopped after GI bleed   Former smoker   No drugs   family history includes Breast cancer (age of onset: 97 - 57) in her cousin; Heart disease in her brother; Hyperlipidemia in her mother; Hypertension in her mother; Lung disease in her father; Stroke in her mother.   Review of Systems As above  Objective:   Physical Exam @BP  122/74   Pulse (!) 57   Ht 5' 4 (1.626 m)   Wt 132 lb 8 oz (60.1 kg)   SpO2 100%   BMI 22.74 kg/m @  General:  NAD Eyes:   anicteric Lungs:  clear Heart::  S1S2 no rubs, murmurs or gallops Abdomen:  soft and nontender, BS+  No rash seen   Data Reviewed:  See HPI     [1]  Allergies Allergen Reactions   Pantoprazole  Sodium Itching   Tape Rash  [2]  Current Meds  Medication Sig   amLODipine -benazepril  (LOTREL) 5-10 MG capsule Take 1 capsule by mouth daily.   b complex vitamins capsule Take 1 capsule by mouth daily.   conjugated estrogens   (PREMARIN ) vaginal cream Place 1 Applicatorful vaginally once a week.   KLOR-CON  M20 20 MEQ tablet TAKE 1 TABLET BY MOUTH TWICE A DAY   levothyroxine  (SYNTHROID ) 75 MCG tablet Take 1 tablet (75 mcg total) by mouth daily.   lipase/protease/amylase (CREON ) 36000 UNITS CPEP capsule Take 2 capsules (72,000 Units total) by mouth 3 (three) times daily with meals. May also take 1 capsule (36,000 Units total) as needed (with snacks - up to 4 snacks daily).   magnesium  oxide (MAG-OX) 400 (240 Mg) MG tablet Take 400 mg by mouth daily.   omeprazole (PRILOSEC) 40 MG capsule Take 1 capsule (40 mg total) by mouth daily.   zolpidem  (AMBIEN  CR) 12.5 MG CR  tablet TAKE 1 TABLET(12.5 MG) BY MOUTH AT BEDTIME AS NEEDED FOR SLEEP (Patient taking differently: Take 12.5 mg by mouth at bedtime.)   [DISCONTINUED] pantoprazole  (PROTONIX ) 40 MG tablet 1 po BID x 4 weeks, then once daily

## 2024-01-23 ENCOUNTER — Emergency Department (HOSPITAL_COMMUNITY)
Admission: EM | Admit: 2024-01-23 | Discharge: 2024-01-23 | Disposition: A | Discharge status: Home / Self Care | Attending: Emergency Medicine | Admitting: Emergency Medicine

## 2024-01-23 ENCOUNTER — Encounter (HOSPITAL_COMMUNITY): Payer: Self-pay

## 2024-01-23 ENCOUNTER — Other Ambulatory Visit: Payer: Self-pay

## 2024-01-23 DIAGNOSIS — E860 Dehydration: Secondary | ICD-10-CM | POA: Diagnosis not present

## 2024-01-23 DIAGNOSIS — Z79899 Other long term (current) drug therapy: Secondary | ICD-10-CM | POA: Diagnosis not present

## 2024-01-23 DIAGNOSIS — I2510 Atherosclerotic heart disease of native coronary artery without angina pectoris: Secondary | ICD-10-CM | POA: Diagnosis not present

## 2024-01-23 DIAGNOSIS — E039 Hypothyroidism, unspecified: Secondary | ICD-10-CM | POA: Diagnosis not present

## 2024-01-23 DIAGNOSIS — I10 Essential (primary) hypertension: Secondary | ICD-10-CM | POA: Diagnosis not present

## 2024-01-23 DIAGNOSIS — Z85828 Personal history of other malignant neoplasm of skin: Secondary | ICD-10-CM | POA: Diagnosis not present

## 2024-01-23 DIAGNOSIS — R197 Diarrhea, unspecified: Secondary | ICD-10-CM | POA: Diagnosis present

## 2024-01-23 LAB — URINALYSIS, ROUTINE W REFLEX MICROSCOPIC
Bilirubin Urine: NEGATIVE
Glucose, UA: NEGATIVE mg/dL
Hgb urine dipstick: NEGATIVE
Ketones, ur: NEGATIVE mg/dL
Nitrite: NEGATIVE
Protein, ur: NEGATIVE mg/dL
Specific Gravity, Urine: <1.005 — ABNORMAL LOW (ref 1.005–1.030)
pH: 6.5 (ref 5.0–8.0)

## 2024-01-23 LAB — COMPREHENSIVE METABOLIC PANEL WITH GFR
ALT: 30 U/L (ref 0–44)
AST: 40 U/L (ref 15–41)
Albumin: 4.7 g/dL (ref 3.5–5.0)
Alkaline Phosphatase: 96 U/L (ref 38–126)
Anion gap: 14 (ref 5–15)
BUN: 12 mg/dL (ref 8–23)
CO2: 25 mmol/L (ref 22–32)
Calcium: 9.8 mg/dL (ref 8.9–10.3)
Chloride: 97 mmol/L — ABNORMAL LOW (ref 98–111)
Creatinine, Ser: 1.06 mg/dL — ABNORMAL HIGH (ref 0.44–1.00)
GFR, Estimated: 53 mL/min — ABNORMAL LOW (ref >60)
Glucose, Bld: 158 mg/dL — ABNORMAL HIGH (ref 70–99)
Potassium: 4.3 mmol/L (ref 3.5–5.1)
Sodium: 136 mmol/L (ref 135–145)
Total Bilirubin: 0.5 mg/dL (ref 0.0–1.2)
Total Protein: 7.5 g/dL (ref 6.5–8.1)

## 2024-01-23 LAB — CBC
HCT: 34.8 % — ABNORMAL LOW (ref 36.0–46.0)
Hemoglobin: 12.1 g/dL (ref 12.0–15.0)
MCH: 32.1 pg (ref 26.0–34.0)
MCHC: 34.8 g/dL (ref 30.0–36.0)
MCV: 92.3 fL (ref 80.0–100.0)
Platelets: 215 K/uL (ref 150–400)
RBC: 3.77 MIL/uL — ABNORMAL LOW (ref 3.87–5.11)
RDW: 12.9 % (ref 11.5–15.5)
WBC: 4.8 K/uL (ref 4.0–10.5)
nRBC: 0 % (ref 0.0–0.2)

## 2024-01-23 LAB — URINALYSIS, MICROSCOPIC (REFLEX)

## 2024-01-23 LAB — LIPASE, BLOOD: Lipase: 71 U/L — ABNORMAL HIGH (ref 11–51)

## 2024-01-23 MED ORDER — METOCLOPRAMIDE HCL 5 MG/ML IJ SOLN
10.0000 mg | Freq: Once | INTRAMUSCULAR | Status: AC
  Administered 2024-01-23: 10:00:00 10 mg via INTRAVENOUS
  Filled 2024-01-23: qty 2

## 2024-01-23 MED ORDER — SODIUM CHLORIDE 0.9 % IV BOLUS
1000.0000 mL | Freq: Once | INTRAVENOUS | Status: AC
  Administered 2024-01-23: 10:00:00 1000 mL via INTRAVENOUS

## 2024-01-23 MED ORDER — DIPHENHYDRAMINE HCL 50 MG/ML IJ SOLN
12.5000 mg | Freq: Once | INTRAMUSCULAR | Status: AC
  Administered 2024-01-23: 10:00:00 12.5 mg via INTRAVENOUS
  Filled 2024-01-23: qty 1

## 2024-01-23 NOTE — ED Notes (Signed)
 Pt ambulated approx 50 feet with steady gait. Pt stated she feels much better. Denies any dizziness/nausea.

## 2024-01-23 NOTE — ED Provider Notes (Signed)
 [place] EMERGENCY DEPARTMENT AT [facility] Provider Note   CSN: [id number] Arrival date & time: 01/23/24  9152     Patient presents with: Diarrhea   Linda Knight is a 83 y.o. female.   Pt is a 83 yo female with pmhx significant for htn, hypothyroidism, arthritis, CAD, sleep apnea, skin cancer, and PUD.  Pt was admitted from 11/13-15 for a GI bleed.  She did see Dr. Avram on 12/12 for a recheck and was doing well.  She developed diarrhea yesterday and had difficulty sleeping.  She's out of her Ambien  and can't sleep without it.  She can get it tomorrow.  Pt denies any black or bloody stools.  No n/v.  No f/c.  Diarrhea has resolved, but she feels a little dizzy when she stands.         Prior to Admission medications  Medication Sig Start Date End Date Taking? Authorizing Provider  amLODipine -benazepril  (LOTREL) 5-10 MG capsule Take 1 capsule by mouth daily. 12/30/23   Parker, Caleb M, MD  b complex vitamins capsule Take 1 capsule by mouth daily.    [provider]  conjugated estrogens  (PREMARIN ) vaginal cream Place 1 Applicatorful vaginally once a week. 03/21/23   Kennyth Worth [name], MD  KLOR-CON  M20 20 MEQ tablet TAKE 1 TABLET BY MOUTH TWICE A DAY 06/15/23   Mallipeddi, Vishnu P, MD  levothyroxine  (SYNTHROID ) 75 MCG tablet Take 1 tablet (75 mcg total) by mouth daily. 11/25/23   Kennyth Worth [name], MD  lipase/protease/amylase (CREON ) 36000 UNITS CPEP capsule Take 2 capsules (72,000 Units total) by mouth 3 (three) times daily with meals. May also take 1 capsule (36,000 Units total) as needed (with snacks - up to 4 snacks daily). 09/26/23   Kennyth Worth [name], MD  magnesium  oxide (MAG-OX) 400 (240 Mg) MG tablet Take 400 mg by mouth daily.    [provider]  omeprazole  (PRILOSEC) 40 MG capsule Take 1 capsule (40 mg total) by mouth daily. 01/20/24   Avram Lupita BRAVO, MD  zolpidem  (AMBIEN  CR) 12.5 MG CR tablet TAKE 1 TABLET(12.5 MG) BY MOUTH AT BEDTIME AS NEEDED  FOR SLEEP Patient taking differently: Take 12.5 mg by mouth at bedtime. 12/19/23   Kennyth Worth [name], MD    Allergies: Pantoprazole  sodium and Tape    Review of Systems  Gastrointestinal:  Positive for diarrhea.  Neurological:  Positive for dizziness.  All other systems reviewed and are negative.   Updated Vital Signs BP 137/72   Pulse 71   Temp 98.3 F (36.8 C) (Oral)   Resp 18   Ht 5' 4 (1.626 m)   Wt 60.1 kg   SpO2 97%   BMI 22.74 kg/m   Physical Exam Vitals and nursing note reviewed.  Constitutional:      Appearance: Normal appearance.  HENT:     Head: Normocephalic and atraumatic.     Right Ear: External ear normal.     Left Ear: External ear normal.     Nose: Nose normal.     Mouth/Throat:     Mouth: Mucous membranes are moist.     Pharynx: Oropharynx is clear.  Eyes:     Extraocular Movements: Extraocular movements intact.     Conjunctiva/sclera: Conjunctivae normal.     Pupils: Pupils are equal, round, and reactive to light.  Cardiovascular:     Rate and Rhythm: Normal rate and regular rhythm.     Pulses: Normal pulses.     Heart sounds: Normal  heart sounds.  Pulmonary:     Effort: Pulmonary effort is normal.     Breath sounds: Normal breath sounds.  Abdominal:     General: Abdomen is flat. Bowel sounds are normal.     Palpations: Abdomen is soft.  Musculoskeletal:        General: Normal range of motion.     Cervical back: Normal range of motion and neck supple.  Skin:    General: Skin is warm.     Capillary Refill: Capillary refill takes less than 2 seconds.  Neurological:     General: No focal deficit present.     Mental Status: She is alert and oriented to person, place, and time.  Psychiatric:        Mood and Affect: Mood normal.        Behavior: Behavior normal.        Thought Content: Thought content normal.        Judgment: Judgment normal.     (all labs ordered are listed, but only abnormal results are displayed) Labs Reviewed   LIPASE, BLOOD - Abnormal; Notable for the following components:      Result Value   Lipase 71 (*)    All other components within normal limits  COMPREHENSIVE METABOLIC PANEL WITH GFR - Abnormal; Notable for the following components:   Chloride 97 (*)    Glucose, Bld 158 (*)    Creatinine, Ser 1.06 (*)    GFR, Estimated 53 (*)    All other components within normal limits  CBC - Abnormal; Notable for the following components:   RBC 3.77 (*)    HCT 34.8 (*)    All other components within normal limits  URINALYSIS, ROUTINE W REFLEX MICROSCOPIC - Abnormal; Notable for the following components:   Specific Gravity, Urine <1.005 (*)    Leukocytes,Ua MODERATE (*)    All other components within normal limits  URINALYSIS, MICROSCOPIC (REFLEX) - Abnormal; Notable for the following components:   Bacteria, UA FEW (*)    Non Squamous Epithelial PRESENT (*)    All other components within normal limits    EKG: None  Radiology: No results found.   Procedures   Medications Ordered in the ED  sodium chloride  0.9 % bolus 1,000 mL (1,000 mLs Intravenous New Bag/Given 01/23/24 1021)  metoCLOPramide  (REGLAN ) injection 10 mg (10 mg Intravenous Given 01/23/24 1025)  diphenhydrAMINE  (BENADRYL ) injection 12.5 mg (12.5 mg Intravenous Given 01/23/24 1025)                                    Medical Decision Making Amount and/or Complexity of Data Reviewed Labs: ordered.  Risk Prescription drug management.   This patient presents to the ED for concern of diarrhea, this involves an extensive number of treatment options, and is a complaint that carries with it a high risk of complications and morbidity.  The differential diagnosis includes electrolyte abn, dehydration, gib   Co morbidities that complicate the patient evaluation  htn, hypothyroidism, arthritis, CAD, sleep apnea, skin cancer, and PUD   Additional history obtained:  Additional history obtained from epic chart  review External records from outside source obtained and reviewed including family   Lab Tests:  I Ordered, and personally interpreted labs.  The pertinent results include:  cbc nl; cmp nl other than cr sl elevated at 1.01 (1.24 on 12/10); lip sl elevated at 71 (chronic pancreatic insuff); ua neg  Medicines ordered and prescription drug management:  I ordered medication including ivfs/reglan /benadryl   for sx  Reevaluation of the patient after these medicines showed that the patient improved I have reviewed the patients home medicines and have made adjustments as needed   Test Considered:  ct  Problem List / ED Course:  Diarrhea:  pt feels much better after fluids.  She is able to ambulate without difficulty.  She is no longer having diarrhea and belly is nontender.  I don't think any further work up is indicated.  Pt is stable for d/c.  Return if worse.    Reevaluation:  After the interventions noted above, I reevaluated the patient and found that they have :improved   Social Determinants of Health:  Lives at home   Dispostion:  After consideration of the diagnostic results and the patients response to treatment, I feel that the patent would benefit from discharge with outpatient f/u.       Final diagnoses:  Diarrhea, unspecified type  Dehydration    ED Discharge Orders     None          Dean Clarity, MD 01/23/24 1221

## 2024-01-23 NOTE — ED Triage Notes (Signed)
 Pt arrived via POV from home c/o diarrhea X 2 days and reports hypertension and difficulty sleeping. Pt reports recent surgery apprx 3 weeks ago as well to resolve a bleeding ulcer.

## 2024-01-25 ENCOUNTER — Telehealth: Payer: Self-pay

## 2024-01-25 NOTE — Telephone Encounter (Signed)
 SABRA

## 2024-01-26 ENCOUNTER — Other Ambulatory Visit

## 2024-01-26 DIAGNOSIS — E871 Hypo-osmolality and hyponatremia: Secondary | ICD-10-CM

## 2024-01-26 LAB — CBC WITH DIFFERENTIAL/PLATELET
Basophils Absolute: 0 K/uL (ref 0.0–0.1)
Basophils Relative: 0.5 % (ref 0.0–3.0)
Eosinophils Absolute: 0 K/uL (ref 0.0–0.7)
Eosinophils Relative: 0.4 % (ref 0.0–5.0)
HCT: 31.6 % — ABNORMAL LOW (ref 36.0–46.0)
Hemoglobin: 11.3 g/dL — ABNORMAL LOW (ref 12.0–15.0)
Lymphocytes Relative: 17 % (ref 12.0–46.0)
Lymphs Abs: 1 K/uL (ref 0.7–4.0)
MCHC: 35.7 g/dL (ref 30.0–36.0)
MCV: 91.2 fl (ref 78.0–100.0)
Monocytes Absolute: 0.4 K/uL (ref 0.1–1.0)
Monocytes Relative: 7.5 % (ref 3.0–12.0)
Neutro Abs: 4.3 K/uL (ref 1.4–7.7)
Neutrophils Relative %: 74.6 % (ref 43.0–77.0)
Platelets: 210 K/uL (ref 150.0–400.0)
RBC: 3.46 Mil/uL — ABNORMAL LOW (ref 3.87–5.11)
RDW: 14 % (ref 11.5–15.5)
WBC: 5.8 K/uL (ref 4.0–10.5)

## 2024-01-26 LAB — BASIC METABOLIC PANEL WITH GFR
BUN: 18 mg/dL (ref 6–23)
CO2: 26 meq/L (ref 19–32)
Calcium: 9.2 mg/dL (ref 8.4–10.5)
Chloride: 91 meq/L — ABNORMAL LOW (ref 96–112)
Creatinine, Ser: 1.22 mg/dL — ABNORMAL HIGH (ref 0.40–1.20)
GFR: 41.48 mL/min — ABNORMAL LOW (ref >60.00)
Glucose, Bld: 126 mg/dL — ABNORMAL HIGH (ref 70–99)
Potassium: 3.7 meq/L (ref 3.5–5.1)
Sodium: 129 meq/L — ABNORMAL LOW (ref 135–145)

## 2024-01-27 ENCOUNTER — Ambulatory Visit: Payer: Self-pay | Admitting: Family Medicine

## 2024-01-27 DIAGNOSIS — E871 Hypo-osmolality and hyponatremia: Secondary | ICD-10-CM

## 2024-01-27 NOTE — Progress Notes (Signed)
 Her hemoglobin levels are stable.    Her sodium has dropped again and her kidney numbers are up again however this may be due to the issues that she has been having with diarrhea.  We can recheck this again in a few weeks so she should let us  know if the diarrhea is not improving.

## 2024-02-06 ENCOUNTER — Encounter: Payer: Self-pay | Admitting: Family Medicine

## 2024-02-06 ENCOUNTER — Ambulatory Visit: Admitting: Family Medicine

## 2024-02-06 VITALS — BP 132/82 | HR 85 | Temp 97.3°F | Ht 64.0 in | Wt 126.4 lb

## 2024-02-06 DIAGNOSIS — E871 Hypo-osmolality and hyponatremia: Secondary | ICD-10-CM | POA: Diagnosis not present

## 2024-02-06 DIAGNOSIS — E860 Dehydration: Secondary | ICD-10-CM

## 2024-02-06 DIAGNOSIS — K279 Peptic ulcer, site unspecified, unspecified as acute or chronic, without hemorrhage or perforation: Secondary | ICD-10-CM | POA: Diagnosis not present

## 2024-02-06 DIAGNOSIS — I10 Essential (primary) hypertension: Secondary | ICD-10-CM

## 2024-02-06 DIAGNOSIS — R197 Diarrhea, unspecified: Secondary | ICD-10-CM | POA: Diagnosis not present

## 2024-02-06 LAB — BASIC METABOLIC PANEL WITH GFR
BUN: 15 mg/dL (ref 6–23)
CO2: 27 meq/L (ref 19–32)
Calcium: 9.4 mg/dL (ref 8.4–10.5)
Chloride: 99 meq/L (ref 96–112)
Creatinine, Ser: 1.06 mg/dL (ref 0.40–1.20)
GFR: 49.09 mL/min — ABNORMAL LOW
Glucose, Bld: 86 mg/dL (ref 70–99)
Potassium: 4 meq/L (ref 3.5–5.1)
Sodium: 137 meq/L (ref 135–145)

## 2024-02-06 NOTE — Assessment & Plan Note (Signed)
 Follows with GI.  She is on iron supplementation with ferrous sulfate.  Recommend she take this with vitamin C.  She will follow-up with GI in a few weeks for repeat colonoscopy.

## 2024-02-06 NOTE — Progress Notes (Signed)
" ° °  Linda Knight is a 83 y.o. female who presents today for an office visit.  Assessment/Plan:  New/Acute Problems: Diarrhea Diarrhea has resolved.  Likely viral GI illness.  We discussed reasons to return to care.  She will let us  know if she has any recurrence.  Dehydration / Hyponatremia Patient noted to have sodium 129 on recent labs likely due to her GI losses.  She is also having some mild orthostatic symptoms as well likely due to ongoing dehydration though this does seem to be improving.  Will recheck labs today though did discuss importance of pushing fluids and increasing sodium intake over the next week or 2.  She will let us  know if the orthostatic symptoms do not improve over the next few weeks.  Chronic Problems Addressed Today: Essential hypertension Blood pressure at goal today on amlodipine  benazepril  5-10 daily.  We are rechecking labs today as above.   PUD (peptic ulcer disease) Follows with GI.  She is on iron supplementation with ferrous sulfate.  Recommend she take this with vitamin C.  She will follow-up with GI in a few weeks for repeat colonoscopy.     Subjective:  HPI:  See assessment / plan for status of chronic conditions.   Discussed the use of AI scribe software for clinical note transcription with the patient, who gave verbal consent to proceed.  History of Present Illness Linda Knight is an 83 year old female who presents for follow-up after an emergency department visit for diarrhea.  She was seen in the emergency department on January 23, 2024, for diarrhea and was found to have mild elevated creatinine. She received IV fluids and felt clinically better before being discharged home. The diarrhea lasted for about two days, with the first day being particularly severe, prompting her to seek emergency care. Since the emergency department visit, her bowel movements have returned to normal.  She has experienced some balance issues, particularly  when bending over and standing up, which she attributes to a lack of appetite and reduced food intake during her illness. She has been trying to eat healthily and drink plenty of water  to avoid dehydration. Her sodium levels were low during her emergency department visit.  She has been consuming electrolyte water  and vitamin water  to maintain hydration. She is also taking iron tablets to improve her iron levels in preparation for a scope procedure scheduled in February. She takes 45 mg of ferrous sulfate daily with food.  She feels much better since the emergency department visit. No ongoing diarrhea and her bowel movements are normal. She notes balance issues when bending over and standing up, which she associates with her recent illness and reduced appetite.         Objective:  Physical Exam: BP 132/82   Pulse 85   Temp (!) 97.3 F (36.3 C) (Temporal)   Ht 5' 4 (1.626 m)   Wt 126 lb 6.4 oz (57.3 kg)   SpO2 98%   BMI 21.70 kg/m   Gen: No acute distress, resting comfortably CV: Regular rate and rhythm with no murmurs appreciated Pulm: Normal work of breathing, clear to auscultation bilaterally with no crackles, wheezes, or rhonchi Neuro: Grossly normal, moves all extremities Psych: Normal affect and thought content      Janaiah Vetrano M. Kennyth, MD 02/06/2024 2:43 PM  "

## 2024-02-06 NOTE — Assessment & Plan Note (Signed)
 Blood pressure at goal today on amlodipine  benazepril  5-10 daily.  We are rechecking labs today as above.

## 2024-02-06 NOTE — Patient Instructions (Signed)
 It was very nice to see you today!  VISIT SUMMARY: You had a follow-up visit after your recent emergency department visit for diarrhea. Your bowel movements have returned to normal, but you have experienced some balance issues. We discussed your sodium levels, iron deficiency, and blood pressure management.  YOUR PLAN: HYPONATREMIA AND DEHYDRATION: You had low sodium levels and dehydration due to recent diarrhea, which caused dizziness and balance issues. -We rechecked your sodium levels with blood work. -Increase your sodium intake by adding extra salt to your meals. -Continue drinking electrolyte water .  IRON DEFICIENCY ANEMIA: You are taking iron tablets to improve your iron levels before your scheduled colonoscopy. -Continue taking ferrous sulfate 45 mg daily with food. -Add vitamin C to your diet to help with iron absorption. You can take vitamin C tablets or drink orange juice.  ESSENTIAL HYPERTENSION: Your high blood pressure is being managed with dietary changes. -Temporarily increase your salt intake for 1-2 weeks to help with your low sodium levels.  Return if symptoms worsen or fail to improve.   Take care, Dr Kennyth  PLEASE NOTE:  If you had any lab tests, please let us  know if you have not heard back within a few days. You may see your results on mychart before we have a chance to review them but we will give you a call once they are reviewed by us .   If we ordered any referrals today, please let us  know if you have not heard from their office within the next week.   If you had any urgent prescriptions sent in today, please check with the pharmacy within an hour of our visit to make sure the prescription was transmitted appropriately.   Please try these tips to maintain a healthy lifestyle:  Eat at least 3 REAL meals and 1-2 snacks per day.  Aim for no more than 5 hours between eating.  If you eat breakfast, please do so within one hour of getting up.   Each meal  should contain half fruits/vegetables, one quarter protein, and one quarter carbs (no bigger than a computer mouse)  Cut down on sweet beverages. This includes juice, soda, and sweet tea.   Drink at least 1 glass of water  with each meal and aim for at least 8 glasses per day  Exercise at least 150 minutes every week.

## 2024-02-07 ENCOUNTER — Ambulatory Visit: Payer: Self-pay | Admitting: Family Medicine

## 2024-02-07 NOTE — Progress Notes (Signed)
 Labs are all back to normal. We can recheck again in 3-6 months.

## 2024-03-01 ENCOUNTER — Encounter (INDEPENDENT_AMBULATORY_CARE_PROVIDER_SITE_OTHER): Payer: Self-pay | Admitting: *Deleted

## 2024-03-03 DIAGNOSIS — E039 Hypothyroidism, unspecified: Secondary | ICD-10-CM

## 2024-03-14 NOTE — Progress Notes (Unsigned)
 [facility] History and Physical   Primary Care Physician:  Kennyth Worth [name], MD   Reason for Procedure:    Encounter Diagnosis  Name Primary?   Acute gastric ulcer with hemorrhage Yes     Plan:    EGD   The patient was provided an opportunity to ask questions and all were answered. The patient agreed with the plan.   HPI: Linda Knight is a 84 y.o. female w/ hx gastric ulcer at EGD 12/2023, admitted for GO bleed at [facility]. Her e for EGD to reassess and ensure healing.   Past Medical History:  Diagnosis Date   Anemia    Arthritis    CAD (coronary artery disease)    High cholesterol    Hypertension    Pancreatic cyst    Pancreatic insufficiency    PUD (peptic ulcer disease)    SCCA (squamous cell carcinoma) of skin 2019   Right Arm [facility])   Sleep apnea    Squamous cell carcinoma of skin 2019   Left Leg [facility])   Thyroid  disease    Upper GI bleed     Past Surgical History:  Procedure Laterality Date   ABDOMINAL HYSTERECTOMY     APPENDECTOMY     BIOPSY  09/27/2019   Procedure: BIOPSY;  Surgeon: Teressa Toribio SQUIBB, MD;  Location: [facility] ENDOSCOPY;  Service: Endoscopy;;   BREAST EXCISIONAL BIOPSY Right 1993   BUNIONECTOMY     carcinoma removed Right 09/2020   CYSTOCELE REPAIR     bladder repair   ESOPHAGOGASTRODUODENOSCOPY N/A 12/22/2023   Procedure: EGD (ESOPHAGOGASTRODUODENOSCOPY);  Surgeon: Cinderella Deatrice FALCON, MD;  Location: [facility] SUITE;  Service: Endoscopy;  Laterality: N/A;   ESOPHAGOGASTRODUODENOSCOPY (EGD) WITH PROPOFOL  N/A 09/27/2019   Procedure: ESOPHAGOGASTRODUODENOSCOPY (EGD) WITH PROPOFOL ;  Surgeon: Teressa Toribio SQUIBB, MD;  Location: [facility] ENDOSCOPY;  Service: Endoscopy;  Laterality: N/A;   EUS N/A 09/27/2019   Procedure: UPPER ENDOSCOPIC ULTRASOUND (EUS) RADIAL;  Surgeon: Teressa Toribio SQUIBB, MD;  Location: [facility] ENDOSCOPY;  Service: Endoscopy;  Laterality: N/A;   KNEE ARTHROSCOPY  06/2021   TYMPANOPLASTY Left 06/2020   VAGINAL PROLAPSE  REPAIR  12/07/2019     Current Outpatient Medications  Medication Sig Dispense Refill   amLODipine -benazepril  (LOTREL) 5-10 MG capsule Take 1 capsule by mouth daily.     b complex vitamins capsule Take 1 capsule by mouth daily.     citalopram  (CELEXA ) 10 MG/5ML suspension Take 10 mg by mouth daily.     conjugated estrogens  (PREMARIN ) vaginal cream Place 1 Applicatorful vaginally once a week. 42.5 g 12   ferrous sulfate ER (SLOW FE) 142 (45 Fe) MG TBCR tablet Take 45 mg by mouth daily.     KLOR-CON  M20 20 MEQ tablet TAKE 1 TABLET BY MOUTH TWICE A DAY 180 tablet 3   levothyroxine  (SYNTHROID ) 75 MCG tablet TAKE 1 TABLET(75 MCG) BY MOUTH DAILY 90 tablet 0   magnesium  oxide (MAG-OX) 400 (240 Mg) MG tablet Take 400 mg by mouth daily.     omeprazole  (PRILOSEC) 40 MG capsule Take 1 capsule (40 mg total) by mouth daily. 90 capsule 3   zolpidem  (AMBIEN  CR) 12.5 MG CR tablet TAKE 1 TABLET(12.5 MG) BY MOUTH AT BEDTIME AS NEEDED FOR SLEEP 30 tablet 5   No current facility-administered medications for this visit.    Allergies as of 03/15/2024 - Review Complete 02/06/2024  Allergen Reaction Noted   Pantoprazole  sodium Itching 01/20/2024   Tape Rash 09/29/2020    Family History  Problem Relation Age of Onset   Hyperlipidemia Mother    Hypertension Mother    Stroke Mother    Lung disease Father    Breast cancer Cousin 40 - 46   Heart disease Brother    Colon cancer Neg Hx     Social History   Socioeconomic History   Marital status: Married    Spouse name: Not on file   Number of children: 3   Years of education: Not on file   Highest education level: Not on file  Occupational History   Occupation: Retired   Tobacco Use   Smoking status: Former    Current packs/day: 0.00    Types: Cigarettes    Quit date: 09/18/1980    Years since quitting: 43.5   Smokeless tobacco: Never  Vaping Use   Vaping status: Never Used  Substance and Sexual Activity   Alcohol use: Not Currently     Comment: no etoh in two weeks (12/22/23), previously drink of whiskey nightly with dinner several nights per week   Drug use: Never   Sexual activity: Not Currently    Comment: 1st intercourse 84 yo-Fewer than 5 partners  Other Topics Concern   Not on file  Social History Narrative   Moved from [place] July 2020   Married lives with husband   2 adult children and grandkids      Was whiskey nightly - stopped after GI bleed   Former smoker   No drugs   Social Drivers of Health   Tobacco Use: Medium Risk (02/06/2024)   Patient History    Smoking Tobacco Use: Former    Smokeless Tobacco Use: Never    Passive Exposure: Not on Actuary Strain: Low Risk (01/12/2023)   Overall Financial Resource Strain (CARDIA)    Difficulty of Paying Living Expenses: Not hard at all  Food Insecurity: No Food Insecurity (12/22/2023)   Epic    Worried About Programme Researcher, Broadcasting/film/video in the Last Year: Never true    Ran Out of Food in the Last Year: Never true  Transportation Needs: No Transportation Needs (12/22/2023)   Epic    Lack of Transportation (Medical): No    Lack of Transportation (Non-Medical): No  Physical Activity: Insufficiently Active (01/12/2023)   Exercise Vital Sign    Days of Exercise per Week: 3 days    Minutes of Exercise per Session: 20 min  Stress: No Stress Concern Present (01/12/2023)   [employer] of Occupational Health - Occupational Stress Questionnaire    Feeling of Stress : Not at all  Social Connections: Moderately Isolated (12/22/2023)   Social Connection and Isolation Panel    Frequency of Communication with Friends and Family: More than three times a week    Frequency of Social Gatherings with Friends and Family: More than three times a week    Attends Religious Services: Never    Database Administrator or Organizations: No    Attends Banker Meetings: Never    Marital Status: Married  Catering Manager Violence: Not At Risk  (12/22/2023)   Epic    Fear of Current or Ex-Partner: No    Emotionally Abused: No    Physically Abused: No    Sexually Abused: No  Depression (PHQ2-9): Low Risk (02/06/2024)   Depression (PHQ2-9)    PHQ-2 Score: 0  Alcohol Screen: Not on file  Housing: Low Risk (12/22/2023)   Epic    Unable to Pay for Housing in the  Last Year: No    Number of Times Moved in the Last Year: 0    Homeless in the Last Year: No  Utilities: Not At Risk (12/22/2023)   Epic    Threatened with loss of utilities: No  Health Literacy: Adequate Health Literacy (01/12/2023)   B1300 Health Literacy    Frequency of need for help with medical instructions: Never    Review of Systems: Positive for *** All other review of systems negative except as mentioned in the HPI.  Physical Exam: Vital signs There were no vitals taken for this visit.  General:   Alert,  Well-developed, well-nourished, pleasant and cooperative in NAD Lungs:  Clear throughout to auscultation.   Heart:  Regular rate and rhythm; no murmurs, clicks, rubs,  or gallops. Abdomen:  Soft, nontender and nondistended. Normal bowel sounds.   Neuro/Psych:  Alert and cooperative. Normal mood and affect. A and O x 3   @Genavive Kubicki  CHARLENA Commander, MD, [facility] [phone] (pager) 03/14/2024 9:45 PM@

## 2024-03-15 ENCOUNTER — Encounter: Payer: Self-pay | Admitting: Internal Medicine

## 2024-03-15 ENCOUNTER — Ambulatory Visit: Admitting: Internal Medicine

## 2024-03-15 VITALS — BP 135/88 | HR 97 | Temp 97.2°F | Resp 17 | Ht 64.0 in | Wt 132.0 lb

## 2024-03-15 DIAGNOSIS — K2950 Unspecified chronic gastritis without bleeding: Secondary | ICD-10-CM | POA: Diagnosis not present

## 2024-03-15 DIAGNOSIS — K250 Acute gastric ulcer with hemorrhage: Secondary | ICD-10-CM

## 2024-03-15 DIAGNOSIS — K2970 Gastritis, unspecified, without bleeding: Secondary | ICD-10-CM

## 2024-03-15 MED ORDER — SODIUM CHLORIDE 0.9 % IV SOLN: 500.0000 mL | INTRAVENOUS | Status: DC

## 2024-03-15 NOTE — Progress Notes (Signed)
 Report given to PACU, vss

## 2024-03-15 NOTE — Progress Notes (Signed)
 Pt's states no medical or surgical changes since previsit or office visit.

## 2024-03-15 NOTE — Op Note (Signed)
 [facility] Patient Name: Linda Knight Procedure Date: 03/15/2024 10:46 AM MRN: [id number] Endoscopist: Lupita FORBES Commander , MD, [id number] Age: 84 Referring MD:  Date of Birth: 05-Mar-1940 Gender: Female Account #: [id number] Procedure:                Upper GI endoscopy Indications:              Acute gastric ulcer, Follow-up of acute gastric                            ulcer Medicines:                Monitored Anesthesia Care Procedure:                Pre-Anesthesia Assessment:                           - Prior to the procedure, a History and Physical                            was performed, and patient medications and                            allergies were reviewed. The patient's tolerance of                            previous anesthesia was also reviewed. The risks                            and benefits of the procedure and the sedation                            options and risks were discussed with the patient.                            All questions were answered, and informed consent                            was obtained. Prior Anticoagulants: The patient has                            taken no anticoagulant or antiplatelet agents. ASA                            Grade Assessment: III - A patient with severe                            systemic disease. After reviewing the risks and                            benefits, the patient was deemed in satisfactory                            condition to undergo the procedure.  After obtaining informed consent, the endoscope was                            passed under direct vision. Throughout the                            procedure, the patient's blood pressure, pulse, and                            oxygen saturations were monitored continuously. The                            GIF [id number] #[id number] was introduced through the                            mouth, and advanced to the second part of  duodenum.                            The upper GI endoscopy was accomplished without                            difficulty. The patient tolerated the procedure                            well. Scope In: Scope Out: Findings:                 Diffuse moderate inflammation characterized by                            congestion (edema), erythema and granularity was                            found in the gastric body and in the gastric antrum.                           The exam was otherwise without abnormality.                           The cardia and gastric fundus were normal on                            retroflexion. Complications:            No immediate complications. Estimated Blood Loss:     Estimated blood loss: none. Impression:               - Chronic gastritis.                           - The examination was otherwise normal. Ulcers                            healed                           - No specimens collected. Recommendation:           -  Patient has a contact number available for                            emergencies. The signs and symptoms of potential                            delayed complications were discussed with the                            patient. Return to normal activities tomorrow.                            Written discharge instructions were provided to the                            patient.                           - Resume previous diet.                           - Continue present medications. Stay on omeprazole                             40 mg daily Lupita FORBES Commander, MD 03/15/2024 10:57:42 AM This report has been signed electronically.

## 2024-03-15 NOTE — Progress Notes (Signed)
1046 Robinul 0.1 mg IV given due large amount of secretions upon assessment.  MD made aware, vss 

## 2024-03-15 NOTE — Patient Instructions (Addendum)
 Ulcers are healed.  There is some inflammation in the stomach but that is common and should not be a problem for you.  We call that gastritis and Dr. Cinderella saw that also.  You should stay on omeprazole  40 mg daily to prevent further ulcers.  I appreciate the opportunity to care for you. Lupita CHARLENA Commander, MD, [facility]   Resume previous diet. Continue present medications.  Stay on omeprazole  40 mg daily.  Handout provided on gastritis.    YOU HAD AN ENDOSCOPIC PROCEDURE TODAY AT THE [facility]:   Refer to the procedure report that was given to you for any specific questions about what was found during the examination.  If the procedure report does not answer your questions, please call your gastroenterologist to clarify.  If you requested that your care partner not be given the details of your procedure findings, then the procedure report has been included in a sealed envelope for you to review at your convenience later.  YOU SHOULD EXPECT: Some feelings of bloating in the abdomen. Passage of more gas than usual.  Walking can help get rid of the air that was put into your GI tract during the procedure and reduce the bloating. If you had a lower endoscopy (such as a colonoscopy or flexible sigmoidoscopy) you may notice spotting of blood in your stool or on the toilet paper. If you underwent a bowel prep for your procedure, you may not have a normal bowel movement for a few days.  Please Note:  You might notice some irritation and congestion in your nose or some drainage.  This is from the oxygen used during your procedure.  There is no need for concern and it should clear up in a day or so.  SYMPTOMS TO REPORT IMMEDIATELY:  Following upper endoscopy (EGD)  Vomiting of blood or coffee ground material  New chest pain or pain under the shoulder blades  Painful or persistently difficult swallowing  New shortness of breath  Fever of 100F or higher  Black, tarry-looking stools  For  urgent or emergent issues, a gastroenterologist can be reached at any hour by calling [phone]. Do not use MyChart messaging for urgent concerns.    DIET:  We do recommend a small meal at first, but then you may proceed to your regular diet.  Drink plenty of fluids but you should avoid alcoholic beverages for 24 hours.  ACTIVITY:  You should plan to take it easy for the rest of today and you should NOT DRIVE or use heavy machinery until tomorrow (because of the sedation medicines used during the test).    FOLLOW UP: Our staff will call the number listed on your records the next business day following your procedure.  We will call around 7:15- 8:00 am to check on you and address any questions or concerns that you may have regarding the information given to you following your procedure. If we do not reach you, we will leave a message.     If any biopsies were taken you will be contacted by phone or by letter within the next 1-3 weeks.  Please call us  at [phone] if you have not heard about the biopsies in 3 weeks.    SIGNATURES/CONFIDENTIALITY: You and/or your care partner have signed paperwork which will be entered into your electronic medical record.  These signatures attest to the fact that that the information above on your After Visit Summary has been reviewed and is understood.  Full  responsibility of the confidentiality of this discharge information lies with you and/or your care-partner.

## 2024-03-16 ENCOUNTER — Telehealth: Payer: Self-pay | Admitting: *Deleted

## 2024-03-16 NOTE — Telephone Encounter (Signed)
 No answer for follow up. Left a message.

## 2024-04-03 ENCOUNTER — Ambulatory Visit
